# Patient Record
Sex: Female | Born: 1970 | Race: White | Hispanic: No | Marital: Married | Smoking: Never smoker
Health system: Southern US, Community
[De-identification: ages and names within clinical notes are randomized; demographics above are authoritative.]

## PROBLEM LIST (undated history)

## (undated) DIAGNOSIS — G43909 Migraine, unspecified, not intractable, without status migrainosus: Secondary | ICD-10-CM

## (undated) DIAGNOSIS — L309 Dermatitis, unspecified: Secondary | ICD-10-CM

## (undated) DIAGNOSIS — K219 Gastro-esophageal reflux disease without esophagitis: Secondary | ICD-10-CM

## (undated) DIAGNOSIS — T7840XA Allergy, unspecified, initial encounter: Secondary | ICD-10-CM

## (undated) DIAGNOSIS — Z9889 Other specified postprocedural states: Secondary | ICD-10-CM

## (undated) DIAGNOSIS — J309 Allergic rhinitis, unspecified: Secondary | ICD-10-CM

## (undated) DIAGNOSIS — E559 Vitamin D deficiency, unspecified: Secondary | ICD-10-CM

## (undated) DIAGNOSIS — R011 Cardiac murmur, unspecified: Secondary | ICD-10-CM

## (undated) HISTORY — DX: Vitamin D deficiency, unspecified: E55.9

## (undated) HISTORY — PX: WISDOM TOOTH EXTRACTION: SHX21

## (undated) HISTORY — PX: BREAST EXCISIONAL BIOPSY: SUR124

## (undated) HISTORY — DX: Allergy, unspecified, initial encounter: T78.40XA

## (undated) HISTORY — PX: BREAST BIOPSY: SHX20

## (undated) HISTORY — DX: Cardiac murmur, unspecified: R01.1

## (undated) HISTORY — DX: Dermatitis, unspecified: L30.9

## (undated) HISTORY — DX: Allergic rhinitis, unspecified: J30.9

## (undated) HISTORY — DX: Migraine, unspecified, not intractable, without status migrainosus: G43.909

---

## 1992-11-01 HISTORY — PX: BREAST REDUCTION SURGERY: SHX8

## 1992-11-01 HISTORY — PX: REDUCTION MAMMAPLASTY: SUR839

## 1996-11-01 HISTORY — PX: CHOLECYSTECTOMY: SHX55

## 2007-06-30 ENCOUNTER — Ambulatory Visit: Payer: Self-pay | Admitting: Internal Medicine

## 2010-06-11 ENCOUNTER — Ambulatory Visit: Payer: Self-pay

## 2010-10-12 ENCOUNTER — Ambulatory Visit: Payer: Self-pay

## 2010-11-01 HISTORY — PX: BREAST SURGERY: SHX581

## 2010-11-19 ENCOUNTER — Ambulatory Visit: Payer: Self-pay | Admitting: General Surgery

## 2010-11-20 LAB — PATHOLOGY REPORT

## 2011-06-14 ENCOUNTER — Ambulatory Visit: Payer: Self-pay

## 2011-12-13 ENCOUNTER — Ambulatory Visit: Payer: Self-pay

## 2012-11-21 ENCOUNTER — Ambulatory Visit: Payer: Self-pay | Admitting: Family Medicine

## 2013-10-08 ENCOUNTER — Ambulatory Visit: Payer: Self-pay | Admitting: Family Medicine

## 2014-11-25 LAB — HM PAP SMEAR

## 2014-12-10 IMAGING — US TRANSABDOMINAL ULTRASOUND OF PELVIS
1 series · 14 of 25 positions shown · non-contrast
Comparison: none

REASON FOR EXAM: pelvic pain
COMMENTS:

PROCEDURE:     JOSEAN - JOSEAN PELVIS NON-OB W/TRANSVAGINAL  - November 21, 2012  [DATE]
RESULT:     Uterus measures 9.2 x 5.3 x 4.1 cm. IUD noted in place. Ovaries
are unremarkable with normal flow. No hydronephrosis. Trace pelvic fluid.

[Series 1: transabdominal ultrasound of pelvis · 0.28mm/px · 14 of 59 slices shown]
[im 1/59]
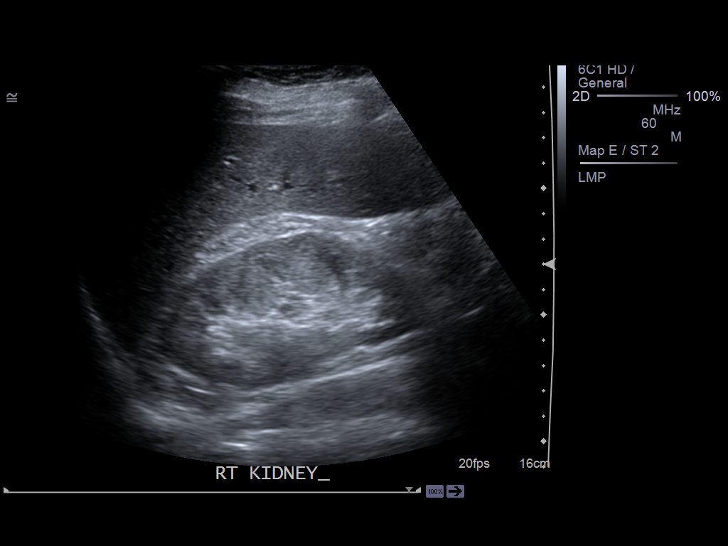
[im 5/59]
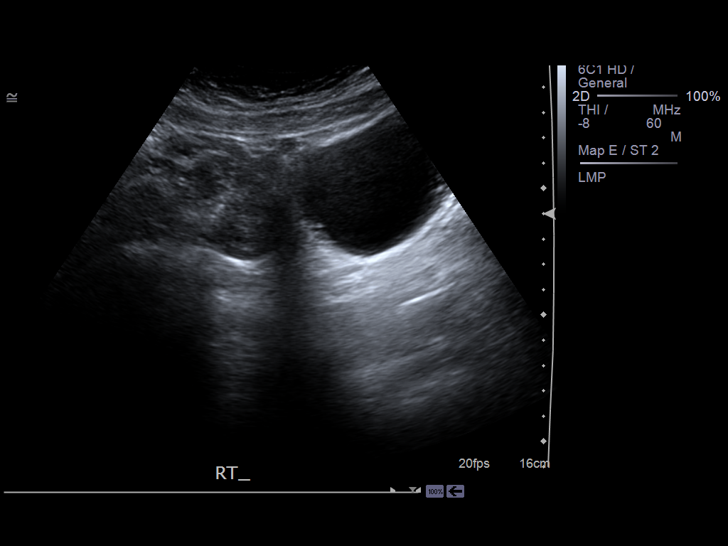
[im 10/59]
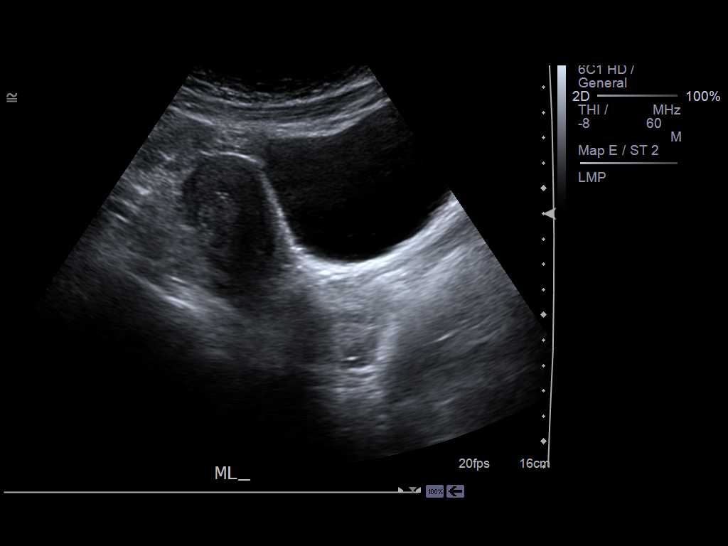
[im 15/59]
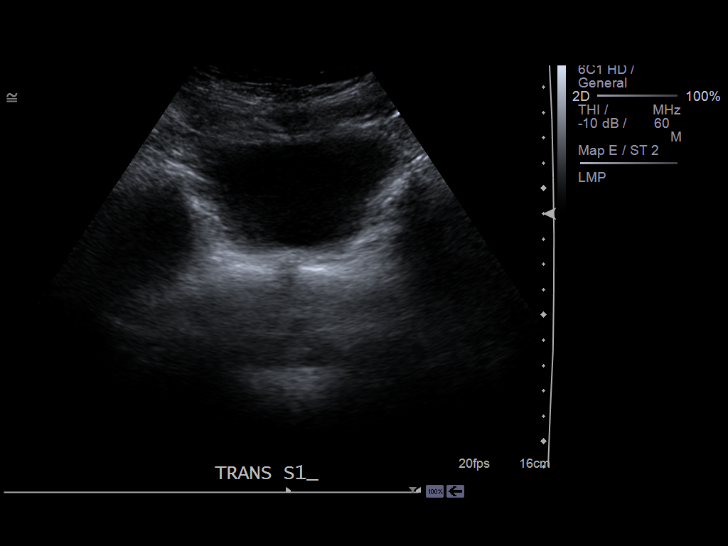
[im 20/59]
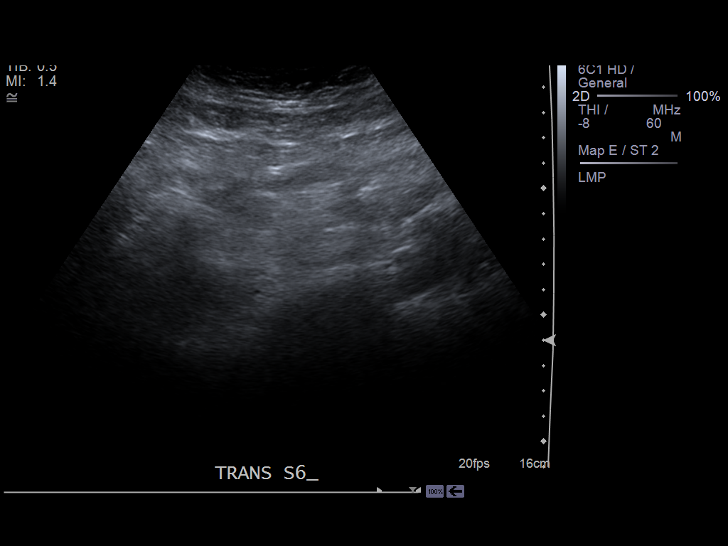
[im 22/59]
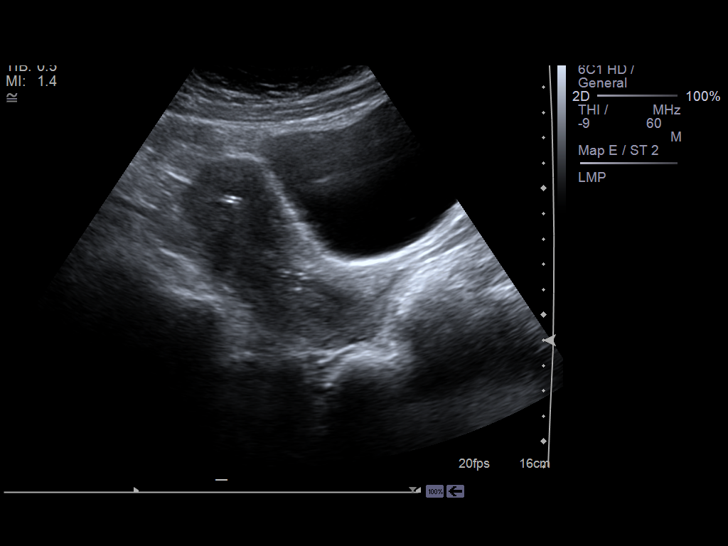
[im 27/59]
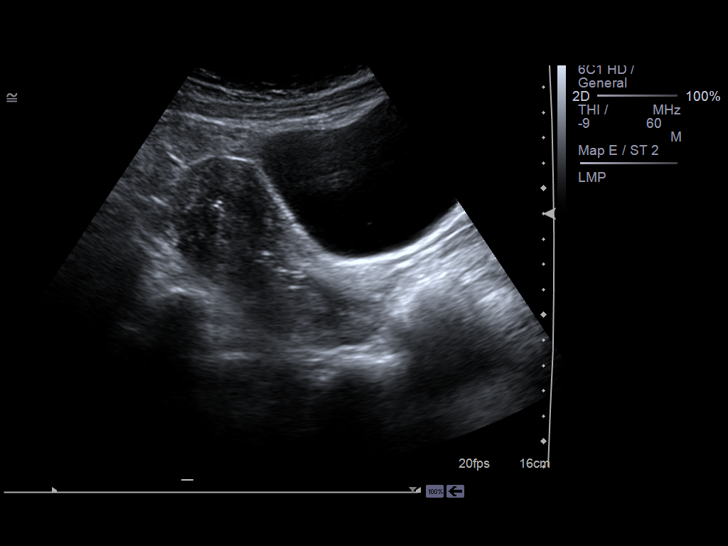
[im 32/59]
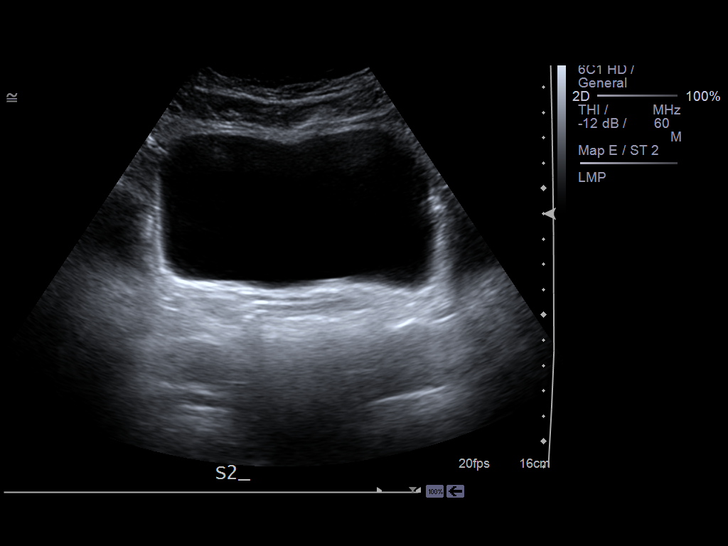
[im 37/59]
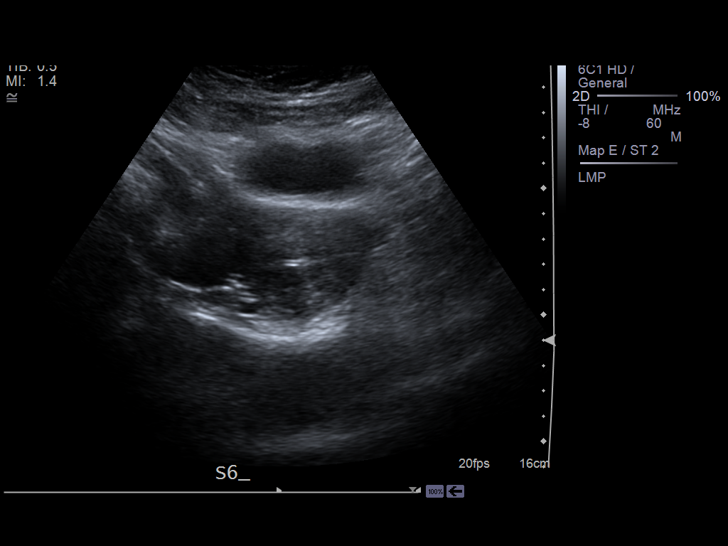
[im 39/59]
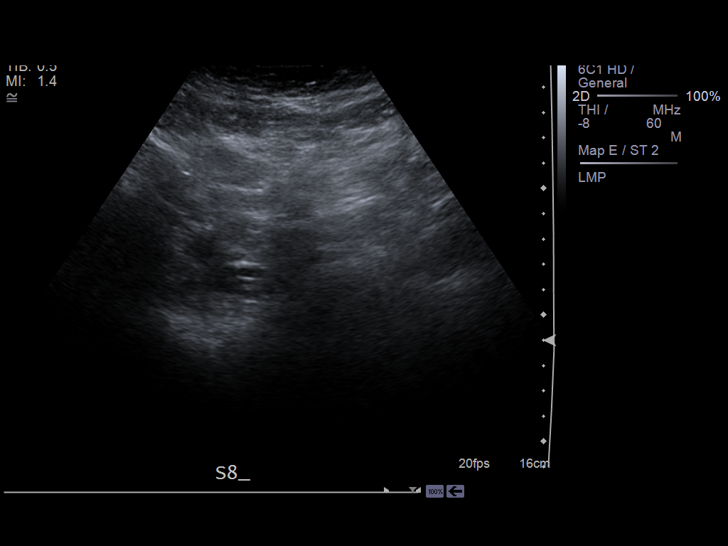
[im 44/59]
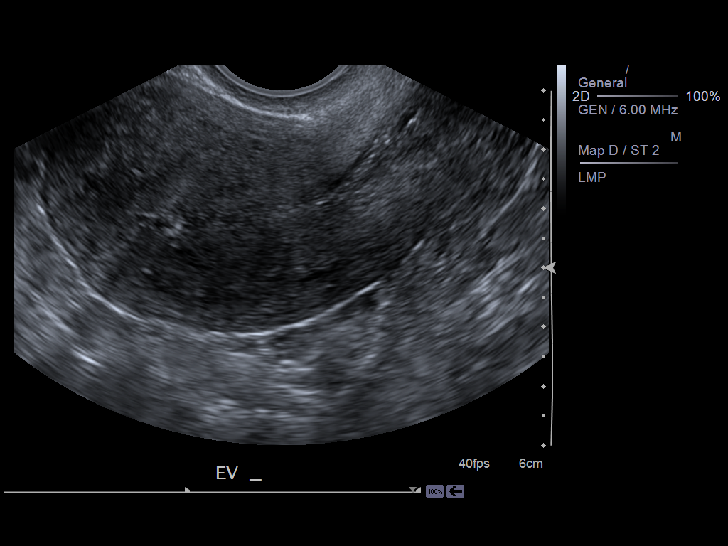
[im 49/59]
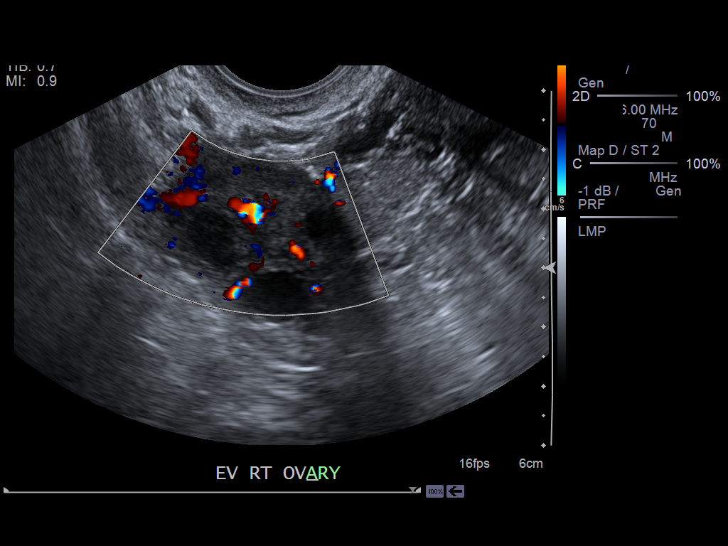
[im 54/59]
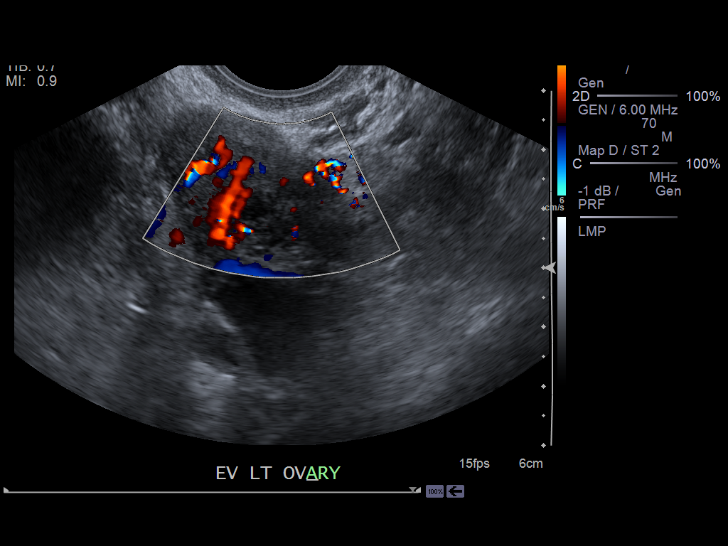
[im 59/59]
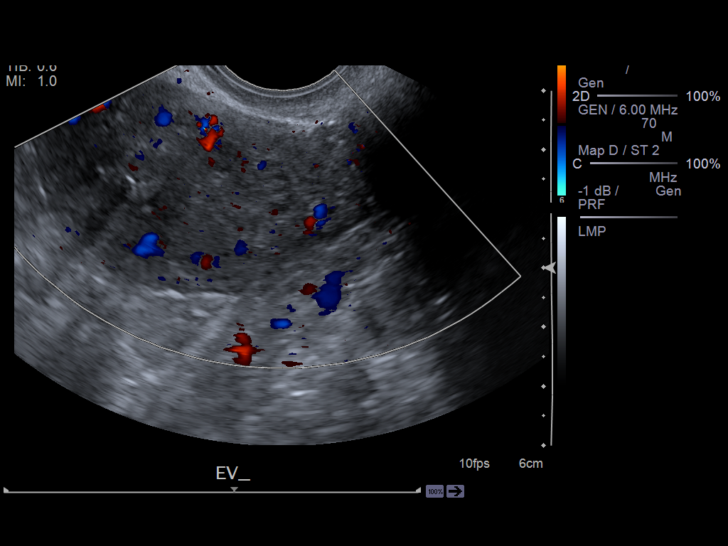

[14 of 25 positions shown; findings below may reference images not displayed]

IMPRESSION: Nonspecific exam. IUD present. Trace pelvic fluid present.

## 2015-02-18 ENCOUNTER — Encounter: Payer: Self-pay | Admitting: *Deleted

## 2015-02-24 ENCOUNTER — Ambulatory Visit (INDEPENDENT_AMBULATORY_CARE_PROVIDER_SITE_OTHER): Payer: BC Managed Care – PPO | Admitting: General Surgery

## 2015-02-24 ENCOUNTER — Encounter: Payer: Self-pay | Admitting: General Surgery

## 2015-02-24 VITALS — BP 130/83 | HR 72 | Resp 14 | Ht 67.0 in | Wt 209.0 lb

## 2015-02-24 DIAGNOSIS — R1013 Epigastric pain: Secondary | ICD-10-CM | POA: Insufficient documentation

## 2015-02-24 NOTE — Patient Instructions (Addendum)
Follow up appointment to be announced.  Patient to have lab work when needed if episode arises.

## 2015-02-24 NOTE — Progress Notes (Signed)
Patient ID: Elizabeth GuarneriKimberly J Consoli, female   DOB: 05/09/1971, 44 y.o.   MRN: [id number]  Chief Complaint  Patient presents with  . Other    Abdomial pain    HPI Elizabeth Case is a 44 y.o. female here today for a evaluation of a abdominal pain. She states her pain is in her upper right quadrant and radiations to her back. The pain lasts for 2-4 hours and is a dull achy  to sharp pain. This has been going on for about about six months and in the last three months more often. No food triggers the pain. No change in bowel habit. She had her gallbladder removed in 1998. The patient had been asymptomatic in regards to post prandial pain until the last 6+ or minus months.  The discomfort is no more than a 5/10, is not prevented her from completing her normal activities and can last anywhere between 1-4 hours. It will frequently radiate into the right upper quadrant into the back one it is most severe. It is associated with nausea, with only one episode of vomiting. She has not appreciated any clear dietary trigger to her pain, nor does she appreciate loose stools after the episodes. No change in the color of her sclera or urine is noted. The patient reports when the episodes are ongoing she experiences tenderness to touch in the epigastrium. Otherwise she is asymptomatic with palpation and is not aware of any thing that improves or exacerbates her pain. HPI  No past medical history on file.  Past Surgical History  Procedure Laterality Date  . Breast reduction surgery  1994  . Breast surgery Right 2012    lumpectomy  . Cholecystectomy  1998    No family history on file.  Social History History  Substance Use Topics  . Smoking status: Never Smoker   . Smokeless tobacco: Not on file  . Alcohol Use: No    No Known Allergies  Current Outpatient Prescriptions  Medication Sig Dispense Refill  . cholecalciferol (VITAMIN D) 1000 UNITS tablet Take 1,000 Units by mouth daily.     No current  facility-administered medications for this visit.    Review of Systems Review of Systems  Constitutional: Negative.   Respiratory: Negative.   Cardiovascular: Negative.   Gastrointestinal: Positive for nausea and abdominal pain. Negative for vomiting, diarrhea, constipation, blood in stool, abdominal distention, anal bleeding and rectal pain.    Blood pressure 130/83, pulse 72, resp. rate 14, height 5\' 7"  (1.702 m), weight 209 lb (94.802 kg).  Physical Exam Physical Exam  Constitutional: She is oriented to person, place, and time. She appears well-developed and well-nourished.  Eyes: Conjunctivae are normal. No scleral icterus.  Neck: Neck supple. No thyromegaly present.  Cardiovascular: Normal rate, regular rhythm and normal heart sounds.   Pulmonary/Chest: Effort normal and breath sounds normal.  Abdominal: Soft. Normal appearance and bowel sounds are normal. There is no hepatomegaly. There is no tenderness. No hernia.  Neurological: She is alert and oriented to person, place, and time.  Skin: Skin is warm and dry.    Data Reviewed Laboratory studies dated 11/25/2014 showed a white blood cell count of 6400, hemoglobin 15.5, MCV 88, platelet count 241,000, normal liver function studies and bilirubin. Creatinine 0.7 with a normal estimated GFR of 108. Normal alkaline phosphatase. Vitamin D level was low at 20.4.  Assessment    Episodic abdominal pain, possible common bile duct stone, less likely peptic ulcer disease.    Plan  The case was informally discussed with GI. MRI of the biliary tree and upper endoscopy recommended. Stability of having a retained common stone with normal liver function studies or possible dysfunction of the sphincter of Oddi were considerations.  Prior to initiated and an expensive workup, the patient would like to have her laboratory studies repeated after one of the severe episodes of pain (most renal still occurring 1 week ago).  She has been  encouraged to present for repeat laboratory studies no lid of the morning following an episode.  A standing order has been placed at [facility] (for Hepatic Function Panel) when patient's next episode arises. Patient has a copy of order and can go to lab to have blood work when needed.       PCP:  Barbaraann Share 02/24/2015, 9:04 PM

## 2015-02-27 ENCOUNTER — Telehealth: Payer: Self-pay | Admitting: General Surgery

## 2015-02-27 NOTE — Telephone Encounter (Signed)
PT CAME IN TODAY &STATES SHE HAD AN EPISODE. SHE WANTED YOU TO KNOW  SHE HAD HER LAB WORK DRAWN THIS AM.

## 2015-02-28 LAB — HEPATIC FUNCTION PANEL
ALT: 11 IU/L (ref 0–32)
AST: 14 IU/L (ref 0–40)
Albumin: 4.4 g/dL (ref 3.5–5.5)
Alkaline Phosphatase: 67 IU/L (ref 39–117)
BILIRUBIN, DIRECT: 0.11 mg/dL (ref 0.00–0.40)
Bilirubin Total: 0.4 mg/dL (ref 0.0–1.2)
TOTAL PROTEIN: 6.6 g/dL (ref 6.0–8.5)

## 2015-02-28 NOTE — Telephone Encounter (Signed)
   Ref Range 1d ago    Total Protein 6.0 - 8.5 g/dL 6.6   Albumin 3.5 - 5.5 g/dL 4.4   Bilirubin Total 0.0 - 1.2 mg/dL 0.4   Bilirubin, Direct 0.00 - 0.40 mg/dL 1.610.11   Alkaline Phosphatase 39 - 117 IU/L 67   AST 0 - 40 IU/L 14   ALT 0 - 32 IU/L 11   Resulting Agency [facility]     Narrative     Performed at: [address] - [facility] [place] [address], [place], [place] [id number] Lab Director: Mila HomerWilliam F Hancock MD, Phone: [phone]       Specimen Collected: 02/27/15 8:05 AM Last Resulted: 02/28/15 5:42 AM        Laboratory results obtained about 8-10 hours after the onset of right upper quadrant pain associated with nausea and diarrhea. No elevation of transaminases noted.  The patient still could be experiencing a common duct stone ball valving and producing symptoms. The next step would be to obtain an MRCP.  Patient was informed of the lab results and the next step to obtain an MRCP. Endoscopy will be reserved pending results of the MRCP study. She is aware that scheduling will likely not take place until May 2.

## 2015-03-03 ENCOUNTER — Other Ambulatory Visit: Payer: Self-pay | Admitting: *Deleted

## 2015-03-03 DIAGNOSIS — R1011 Right upper quadrant pain: Secondary | ICD-10-CM

## 2015-03-03 DIAGNOSIS — R112 Nausea with vomiting, unspecified: Secondary | ICD-10-CM

## 2015-03-03 NOTE — Telephone Encounter (Signed)
Patient has been scheduled for an MRCP at [facility] for 03-06-15 at 3 pm (arrive 2:30 pm). Prep: none.   This patient is aware of date, time, and instructions.

## 2015-03-06 ENCOUNTER — Ambulatory Visit
Admission: RE | Admit: 2015-03-06 | Discharge: 2015-03-06 | Disposition: A | Discharge status: Home / Self Care | Payer: BC Managed Care – PPO | Source: Ambulatory Visit | Attending: General Surgery | Admitting: General Surgery

## 2015-03-06 DIAGNOSIS — R1011 Right upper quadrant pain: Secondary | ICD-10-CM | POA: Diagnosis not present

## 2015-03-06 DIAGNOSIS — R112 Nausea with vomiting, unspecified: Secondary | ICD-10-CM | POA: Diagnosis present

## 2015-03-06 MED ORDER — GADOBENATE DIMEGLUMINE 529 MG/ML IV SOLN
20.0000 mL | Freq: Once | INTRAVENOUS | Status: AC | PRN
  Administered 2015-03-06: 20 mL via INTRAVENOUS

## 2015-03-09 ENCOUNTER — Telehealth: Payer: Self-pay | Admitting: General Surgery

## 2015-03-09 NOTE — Telephone Encounter (Signed)
The patient was notified that the MRI completed 03/06/2015 failed to show any abnormalities that could account for the patient's pain.  She continues to have episodic, low-grade (4-5/10) occasionally associated with nausea, vomiting and/or diarrhea.  Options for management at this time were reviewed: 1) endoscopy, likely upper and lower considering nausea vomiting and diarrhea can be associated with the pain versus 2) University assessment for the unlikely possibility of spasm of the sphincter of Oddi which can only be detected by endoscopic manometry.  The patient will consider her options and notify the office of how she would like to proceed.

## 2015-09-01 ENCOUNTER — Ambulatory Visit (INDEPENDENT_AMBULATORY_CARE_PROVIDER_SITE_OTHER): Payer: BC Managed Care – PPO | Admitting: Family Medicine

## 2015-09-01 ENCOUNTER — Encounter: Payer: Self-pay | Admitting: Family Medicine

## 2015-09-01 VITALS — BP 122/82 | HR 96 | Temp 98.2°F | Wt 213.0 lb

## 2015-09-01 DIAGNOSIS — J320 Chronic maxillary sinusitis: Secondary | ICD-10-CM

## 2015-09-01 DIAGNOSIS — L309 Dermatitis, unspecified: Secondary | ICD-10-CM | POA: Insufficient documentation

## 2015-09-01 DIAGNOSIS — E559 Vitamin D deficiency, unspecified: Secondary | ICD-10-CM | POA: Diagnosis not present

## 2015-09-01 DIAGNOSIS — H1013 Acute atopic conjunctivitis, bilateral: Secondary | ICD-10-CM | POA: Diagnosis not present

## 2015-09-01 DIAGNOSIS — J309 Allergic rhinitis, unspecified: Secondary | ICD-10-CM | POA: Insufficient documentation

## 2015-09-01 DIAGNOSIS — H6121 Impacted cerumen, right ear: Secondary | ICD-10-CM | POA: Diagnosis not present

## 2015-09-01 DIAGNOSIS — G43909 Migraine, unspecified, not intractable, without status migrainosus: Secondary | ICD-10-CM | POA: Insufficient documentation

## 2015-09-01 DIAGNOSIS — H1010 Acute atopic conjunctivitis, unspecified eye: Secondary | ICD-10-CM | POA: Insufficient documentation

## 2015-09-01 MED ORDER — AMOXICILLIN-POT CLAVULANATE 875-125 MG PO TABS: 1.0000 | ORAL_TABLET | Freq: Two times a day (BID) | ORAL | Status: DC

## 2015-09-01 MED ORDER — FLUCONAZOLE 150 MG PO TABS: 150.0000 mg | ORAL_TABLET | Freq: Once | ORAL | Status: DC

## 2015-09-01 NOTE — Assessment & Plan Note (Signed)
On new eye drops per eye doctor

## 2015-09-01 NOTE — Progress Notes (Signed)
BP 122/82 mmHg  Pulse 96  Temp(Src) 98.2 F (36.8 C)  Wt 213 lb (96.616 kg)  SpO2 99%   Subjective:    Patient ID: Elizabeth Case, female    DOB: 29-Feb-1971, 44 y.o.   MRN: [id number]  HPI: Elizabeth Case is a 44 y.o. female  Chief Complaint  Patient presents with  . Sinusitis    off and on x 3 weeks.  . Lab    Would like to have Vit D level checked   She has had sinusitis for 3 weeks; she will use sudafed and using afrin to just breathe; she feels better today than she has in a few days; she is blowing out some yellowish nasty stuff; little bit of fever last night; wonders if she gets them in the middle of the night might be getting hot; no rash; no travel; ears are bothering her some; teeth and eyeballs are obthering her; shooting pains in her eyes and saw eye doctor last week and they started her on Lotemax drops for her eyes, bad allergies  She has history of low vitamin D; she is trying to remember to take 2,000 iu every other day but hard to remember to do that; works indoors; not much time outdoors on the weekends; poor energy; no known thyroid trouble personally or family; weight gain but stress from work; no constipation; she is using an OTC product with greens in it; we reviewed her previous labs; vitamin D in January 2016 was 20.4; recheck in May was 50.9  Relevant past medical, surgical, family and social history reviewed and updated as indicated. Interim medical history since our last visit reviewed. Allergies and medications reviewed and updated.  Review of Systems Per HPI unless specifically indicated above     Objective:    BP 122/82 mmHg  Pulse 96  Temp(Src) 98.2 F (36.8 C)  Wt 213 lb (96.616 kg)  SpO2 99%  Wt Readings from Last 3 Encounters:  09/01/15 213 lb (96.616 kg)  12/18/14 208 lb (94.348 kg)  02/24/15 209 lb (94.802 kg)    Physical Exam  Constitutional: She appears well-developed and well-nourished. No distress.  HENT:  Head:  Normocephalic and atraumatic.  Right Ear: External ear normal.  Left Ear: Hearing, tympanic membrane, external ear and ear canal normal.  No middle ear effusion.  Nose: Rhinorrhea (cloudy rhinorrhea) present. No mucosal edema or nasal deformity. Right sinus exhibits maxillary sinus tenderness. Right sinus exhibits no frontal sinus tenderness. Left sinus exhibits no maxillary sinus tenderness and no frontal sinus tenderness.  Mouth/Throat: Oropharynx is clear and moist and mucous membranes are normal. No oropharyngeal exudate, posterior oropharyngeal edema or posterior oropharyngeal erythema.  Right external ear canal occluded with wax, packed back deep in the canal with fibers and central depression consistent with cotton swab insertion  Eyes: EOM are normal. Right eye exhibits no discharge. Left eye exhibits no discharge. No scleral icterus.  Neck: No thyromegaly present.  Cardiovascular: Normal rate, regular rhythm and normal heart sounds.   No murmur heard. Pulmonary/Chest: Effort normal and breath sounds normal. No respiratory distress. She has no wheezes.  Musculoskeletal: Normal range of motion. She exhibits no edema.  Neurological: She is alert. She exhibits normal muscle tone.  Skin: Skin is warm and dry. No rash noted. She is not diaphoretic. No pallor.  Psychiatric: She has a normal mood and affect. Her behavior is normal. Judgment and thought content normal.   Results for orders placed or performed in visit  on 09/01/15  HM PAP SMEAR  Result Value Ref Range   HM Pap smear per PP       Assessment & Plan:   Problem List Items Addressed This Visit      Other   Vitamin D deficiency disease    Check level today; number had come back up, but patient wanted to have it rechecked again      Relevant Orders   Vit D  25 hydroxy (rtn osteoporosis monitoring)   Allergic conjunctivitis    On new eye drops per eye doctor       Other Visit Diagnoses    Right maxillary sinusitis    -   Primary    start antibiotics; c diff cautions given, contraceptive caution given; rest, hydration, vit C, green tea; do NOT use afrin for more than 3 days    Relevant Medications    amoxicillin-clavulanate (AUGMENTIN) 875-125 MG tablet    fluconazole (DIFLUCAN) 150 MG tablet    Impacted cerumen of right ear        irrigated with warm water; attempted to remove cerumen with currette under direct visualization; unable to remove; use OTC product; to ENT if unresolved       Follow up plan: Return if symptoms worsen or fail to improve.  Meds ordered this encounter  Medications  . LOTEMAX 0.5 % ophthalmic suspension    Sig:   . amoxicillin-clavulanate (AUGMENTIN) 875-125 MG tablet    Sig: Take 1 tablet by mouth 2 (two) times daily.    Dispense:  20 tablet    Refill:  0  . fluconazole (DIFLUCAN) 150 MG tablet    Sig: Take 1 tablet (150 mg total) by mouth once.    Dispense:  1 tablet    Refill:  0   Orders Placed This Encounter  Procedures  . Vit D  25 hydroxy (rtn osteoporosis monitoring)   An after-visit summary was printed and given to the patient at check-out.  Please see the patient instructions which may contain other information and recommendations beyond what is mentioned above in the assessment and plan.

## 2015-09-01 NOTE — Assessment & Plan Note (Addendum)
Check level today; number had come back up, but patient wanted to have it rechecked again

## 2015-09-01 NOTE — Patient Instructions (Addendum)
Try vitamin C (orange juice if not diabetic or vitamin C tablets) and drink green tea to help your immune system during your illness Get plenty of rest and hydration Start the antibiotics Please do eat yogurt daily or take a probiotic daily for the next month or two We want to replace the healthy germs in the gut If you notice foul, watery diarrhea in the next two months, schedule an appointment RIGHT AWAY Try cerumenex or debrox (or generic equivalent) to help soften ear wax in the right ear If you need to see the ear nose throat doctor, just call me Start multiple vitamin dailyCerumen Impaction The structures of the external ear canal secrete a waxy substance known as cerumen. Excess cerumen can build up in the ear canal, causing a condition known as cerumen impaction. Cerumen impaction can cause ear pain and disrupt the function of the ear. The rate of cerumen production differs for each individual. In certain individuals, the configuration of the ear canal may decrease his or her ability to naturally remove cerumen. CAUSES Cerumen impaction is caused by excessive cerumen production or buildup. RISK FACTORS  Frequent use of swabs to clean ears.  Having narrow ear canals.  Having eczema.  Being dehydrated. SIGNS AND SYMPTOMS  Diminished hearing.  Ear drainage.  Ear pain.  Ear itch. TREATMENT Treatment may involve:  Over-the-counter or prescription ear drops to soften the cerumen.  Removal of cerumen by a health care provider. This may be done with:  Irrigation with warm water. This is the most common method of removal.  Ear curettes and other instruments.  Surgery. This may be done in severe cases. HOME CARE INSTRUCTIONS  Take medicines only as directed by your health care provider.  Do not insert objects into the ear with the intent of cleaning the ear. PREVENTION  Do not insert objects into the ear, even with the intent of cleaning the ear. Removing cerumen as a  part of normal hygiene is not necessary, and the use of swabs in the ear canal is not recommended.  Drink enough water to keep your urine clear or pale yellow.  Control your eczema if you have it. SEEK MEDICAL CARE IF:  You develop ear pain.  You develop bleeding from the ear.  The cerumen does not clear after you use ear drops as directed.   This information is not intended to replace advice given to you by your health care provider. Make sure you discuss any questions you have with your health care provider.   Document Released: 11/25/2004 Document Revised: 11/08/2014 Document Reviewed: 06/04/2015 Elsevier Interactive Patient Education [employer]2016 [employer].

## 2015-09-02 ENCOUNTER — Telehealth: Payer: Self-pay | Admitting: Family Medicine

## 2015-09-02 ENCOUNTER — Encounter: Payer: Self-pay | Admitting: Family Medicine

## 2015-09-02 LAB — VITAMIN D 25 HYDROXY (VIT D DEFICIENCY, FRACTURES): Vit D, 25-Hydroxy: 40.3 ng/mL (ref 30.0–100.0)

## 2015-09-02 NOTE — Telephone Encounter (Signed)
I am not sure if urgent care has equipment for ear wax removal but that's one idea; she could call them She can try the drops OTC I'm sorry we weren't able to get her ear cleaned out for her Maybe another ENT office could get her in

## 2015-09-02 NOTE — Telephone Encounter (Signed)
Patient notified

## 2015-09-02 NOTE — Telephone Encounter (Signed)
Pt says her ears are worse and would like to know what to do

## 2015-09-02 NOTE — Telephone Encounter (Signed)
Dr. Sherie DonLada, please advise, she states ENT can't see her until next week.

## 2016-02-02 ENCOUNTER — Ambulatory Visit: Payer: BC Managed Care – PPO | Admitting: Unknown Physician Specialty

## 2016-02-02 ENCOUNTER — Encounter: Payer: Self-pay | Admitting: Family Medicine

## 2016-02-02 ENCOUNTER — Ambulatory Visit (INDEPENDENT_AMBULATORY_CARE_PROVIDER_SITE_OTHER): Payer: BC Managed Care – PPO | Admitting: Family Medicine

## 2016-02-02 VITALS — BP 122/78 | HR 120 | Temp 98.6°F | Resp 16 | Wt 220.0 lb

## 2016-02-02 DIAGNOSIS — R519 Headache, unspecified: Secondary | ICD-10-CM

## 2016-02-02 DIAGNOSIS — J111 Influenza due to unidentified influenza virus with other respiratory manifestations: Secondary | ICD-10-CM | POA: Diagnosis not present

## 2016-02-02 DIAGNOSIS — R51 Headache: Secondary | ICD-10-CM | POA: Diagnosis not present

## 2016-02-02 DIAGNOSIS — R69 Illness, unspecified: Secondary | ICD-10-CM

## 2016-02-02 LAB — POCT INFLUENZA A/B
INFLUENZA B, POC: NEGATIVE
Influenza A, POC: NEGATIVE

## 2016-02-02 MED ORDER — OSELTAMIVIR PHOSPHATE 75 MG PO CAPS: 75.0000 mg | ORAL_CAPSULE | Freq: Two times a day (BID) | ORAL | Status: AC

## 2016-02-02 NOTE — Progress Notes (Signed)
BP 122/78 mmHg  Pulse 120  Temp(Src) 98.6 F (37 C) (Oral)  Resp 16  Wt 220 lb (99.791 kg)  SpO2 97%   Subjective:    Patient ID: Elizabeth Case, female    DOB: June 26, 1971, 45 y.o.   MRN: [id number]  HPI: Elizabeth Case is a 45 y.o. female  Chief Complaint  Patient presents with  . URI    onset 4 day, syptoms include: congestion, cough, sinus pressure, low grade fever.  Taking otc mucinex   Patient has been sick since Friday; didn't have muscle aches; did have a lot of sinus congestion; sinus headache; sore throat; no swollen glands; no rash; ears are bothering her from pressure; had daily fever around lunch time; really fatigued and would take 3 hour naps; coughing, dry and barky; sometimes gets a little phlegm; no shortness of breath or trouble of breathing Tried mucinex and advil Husband sick with influenza A right now Did not get flu shot this year  Relevant past medical, surgical, social history reviewed and updated as indicated Past Medical History  Diagnosis Date  . Allergic rhinitis   . Migraines   . Eczema   . Vitamin D deficiency disease    Past Surgical History  Procedure Laterality Date  . Breast reduction surgery  1994  . Breast surgery Right 2012    lumpectomy  . Cholecystectomy  1998   Social History  Substance Use Topics  . Smoking status: Never Smoker   . Smokeless tobacco: Never Used  . Alcohol Use: No   Allergies and medications reviewed and updated  Review of Systems  Per HPI unless specifically indicated above     Objective:    BP 122/78 mmHg  Pulse 120  Temp(Src) 98.6 F (37 C) (Oral)  Resp 16  Wt 220 lb (99.791 kg)  SpO2 97%  Wt Readings from Last 3 Encounters:  02/02/16 220 lb (99.791 kg)  09/01/15 213 lb (96.616 kg)  12/18/14 208 lb (94.348 kg)    Physical Exam  Constitutional: She appears well-developed and well-nourished. No distress (appears not to feel well but nontoxic).  HENT:  Right Ear: Hearing, tympanic  membrane, external ear and ear canal normal. Tympanic membrane is not erythematous. No middle ear effusion.  Left Ear: Hearing, tympanic membrane, external ear and ear canal normal. Tympanic membrane is not erythematous.  No middle ear effusion.  Nose: No rhinorrhea.  Mouth/Throat: Oropharynx is clear and moist and mucous membranes are normal. No oropharyngeal exudate or posterior oropharyngeal erythema.  Eyes: EOM are normal. No scleral icterus.  Cardiovascular: Regular rhythm.  Tachycardia present.   Heart rate closer to 100 upon auscultation  Pulmonary/Chest: Effort normal and breath sounds normal.  Lymphadenopathy:    She has no cervical adenopathy.  Skin: Skin is warm and dry. No rash noted. No erythema. No pallor.  Psychiatric: She has a normal mood and affect. Her behavior is normal.    Results for orders placed or performed in visit on 02/02/16  POCT Influenza A/B  Result Value Ref Range   Influenza A, POC Negative Negative   Influenza B, POC Negative Negative      Assessment & Plan:   Problem List Items Addressed This Visit    None    Visit Diagnoses    Acute nonintractable headache, unspecified headache type    -  Primary    headaches, fatigue; opt to treat for suspect flu; call if not improving as mono also on ddx; further testing  if needed    Relevant Orders    POCT Influenza A/B (Completed)    Influenza-like illness        explained that I am concerned she most likely has flu-like illness; husband with known flu; will opt to treat with tamiflu; see AVS; ABX not indicated now       Follow up plan: Return if symptoms worsen or fail to improve.  Meds ordered this encounter  Medications  . oseltamivir (TAMIFLU) 75 MG capsule    Sig: Take 1 capsule (75 mg total) by mouth 2 (two) times daily.    Dispense:  10 capsule    Refill:  0   An after-visit summary was printed and given to the patient at check-out.  Please see the patient instructions which may contain other  information and recommendations beyond what is mentioned above in the assessment and plan.

## 2016-02-02 NOTE — Patient Instructions (Addendum)
I suspect that you have flu Start tamiflu Try vitamin C (orange juice if not diabetic or vitamin C tablets) and drink green tea to help your immune system during your illness Get plenty of rest and hydration Call us if needed

## 2016-08-25 ENCOUNTER — Encounter: Payer: Self-pay | Admitting: Family Medicine

## 2016-08-25 ENCOUNTER — Ambulatory Visit (INDEPENDENT_AMBULATORY_CARE_PROVIDER_SITE_OTHER): Payer: BC Managed Care – PPO | Admitting: Family Medicine

## 2016-08-25 VITALS — BP 123/73 | HR 88 | Temp 98.1°F | Resp 16 | Ht 69.0 in | Wt 225.9 lb

## 2016-08-25 DIAGNOSIS — K625 Hemorrhage of anus and rectum: Secondary | ICD-10-CM

## 2016-08-25 DIAGNOSIS — K6289 Other specified diseases of anus and rectum: Secondary | ICD-10-CM

## 2016-08-25 DIAGNOSIS — K629 Disease of anus and rectum, unspecified: Secondary | ICD-10-CM | POA: Diagnosis not present

## 2016-08-25 LAB — CBC WITH DIFFERENTIAL/PLATELET
BASOS ABS: 66 {cells}/uL (ref 0–200)
Basophils Relative: 1 %
EOS PCT: 3 %
Eosinophils Absolute: 198 cells/uL (ref 15–500)
HCT: 44.5 % (ref 35.0–45.0)
HEMOGLOBIN: 15.2 g/dL (ref 11.7–15.5)
LYMPHS ABS: 1980 {cells}/uL (ref 850–3900)
Lymphocytes Relative: 30 %
MCH: 30.2 pg (ref 27.0–33.0)
MCHC: 34.2 g/dL (ref 32.0–36.0)
MCV: 88.3 fL (ref 80.0–100.0)
MONOS PCT: 9 %
MPV: 10.1 fL (ref 7.5–12.5)
Monocytes Absolute: 594 cells/uL (ref 200–950)
NEUTROS ABS: 3762 {cells}/uL (ref 1500–7800)
Neutrophils Relative %: 57 %
PLATELETS: 282 10*3/uL (ref 140–400)
RBC: 5.04 MIL/uL (ref 3.80–5.10)
RDW: 13.2 % (ref 11.0–15.0)
WBC: 6.6 10*3/uL (ref 3.8–10.8)

## 2016-08-25 LAB — POC HEMOCCULT BLD/STL (OFFICE/1-CARD/DIAGNOSTIC): FECAL OCCULT BLD: POSITIVE — AB

## 2016-08-25 MED ORDER — HYDROCORTISONE ACE-PRAMOXINE 1-1 % RE FOAM: 1.0000 | Freq: Three times a day (TID) | RECTAL | 0 refills | Status: DC

## 2016-08-25 NOTE — Progress Notes (Signed)
Name: Elizabeth Case   MRN: [id number]    DOB: 04-16-1971   Date:08/25/2016       Progress Note  Subjective  Chief Complaint  Chief Complaint  Patient presents with  . Acute Visit    Blood in stool (Elizabeth Case pt) x3 days  This patient is followed by Elizabeth Case, new to me  HPI  Blood In Stool: Present for 3 days, bright red blood, present with every bowel movement. this morning she had pain during bowel movement.  She has history of hemorrhoids and anal fissure in the past. She has used Preparation-H which did not provide relief.  No shortness of breath, chest pain, nausea, vomiting, or unusual fatigue.    Past Medical History:  Diagnosis Date  . Allergic rhinitis   . Eczema   . Migraines   . Vitamin D deficiency disease     Past Surgical History:  Procedure Laterality Date  . BREAST REDUCTION SURGERY  1994  . BREAST SURGERY Right 2012   lumpectomy  . CHOLECYSTECTOMY  1998    Family History  Problem Relation Age of Onset  . Hypertension Mother   . Pulmonary fibrosis Mother   . Hypertension Father   . Cancer Paternal Grandmother     breast  . Stroke Paternal Grandfather     cerebral aneurysm caused the stroke  . Diabetes Neg Hx   . Heart disease Neg Hx     Social History   Social History  . Marital status: Married    Spouse name: N/A  . Number of children: N/A  . Years of education: N/A   Occupational History  . Not on file.   Social History Main Topics  . Smoking status: Never Smoker  . Smokeless tobacco: Never Used  . Alcohol use No  . Drug use: No  . Sexual activity: Not on file   Other Topics Concern  . Not on file   Social History Narrative  . No narrative on file     Current Outpatient Prescriptions:  .  cholecalciferol (VITAMIN D) 1000 UNITS tablet, Take 1,000 Units by mouth daily., Disp: , Rfl:  .  desonide (DESOWEN) 0.05 % lotion, Apply 1 application topically 2 (two) times daily as needed., Disp: , Rfl:  .  levonorgestrel (MIRENA)  20 MCG/24HR IUD, by Intrauterine route., Disp: , Rfl:  .  LOTEMAX 0.5 % ophthalmic suspension, , Disp: , Rfl:  .  nortriptyline (PAMELOR) 10 MG capsule, Take 10 mg by mouth at bedtime. For migraine prophylaxis, Disp: , Rfl:  .  SUMAtriptan (IMITREX) 50 MG tablet, Take 50 mg by mouth every 2 (two) hours as needed for migraine. May repeat in 2 hours if headache persists or recurs., Disp: , Rfl:   Allergies  Allergen Reactions  . Latex Dermatitis    Review of Systems  Constitutional: Negative for chills, fever, malaise/fatigue and weight loss.  Cardiovascular: Negative for chest pain.  Gastrointestinal: Positive for blood in stool. Negative for abdominal pain, melena, nausea and vomiting.  Genitourinary: Positive for dysuria. Negative for hematuria.     Objective  Vitals:   08/25/16 0951  BP: 123/73  Pulse: 88  Resp: 16  Temp: 98.1 F (36.7 C)  TempSrc: Oral  SpO2: 96%  Weight: 225 lb 14.4 oz (102.5 kg)  Height: 5\' 9"  (1.753 m)    Physical Exam  Constitutional: She is well-developed, well-nourished, and in no distress.  HENT:  Head: Normocephalic and atraumatic.  Cardiovascular: Normal rate, regular rhythm, S1  normal, S2 normal and normal heart sounds.   No murmur heard. Pulmonary/Chest: Effort normal and breath sounds normal. She has no wheezes.  Abdominal: Soft. Bowel sounds are normal. There is no tenderness.  Genitourinary: Rectal exam shows mass and guaiac positive stool. Rectal exam shows no internal hemorrhoid and no fissure.  Genitourinary Comments: Non bleeding, non tender perirectal mass at 6 o' clock, DRE normal, no tenderness, no palpable internal hemorrhoid, Hemoccult weakly positive.    Nursing note and vitals reviewed.    Assessment & Plan  1. BRBPR (bright red blood per rectum) Unclear etiology, Hemoccult positive. Likely hemorrhoids but will need colonoscopy to assess for source of bleeding. - Ambulatory referral to Gastroenterology - CBC with  Differential - POC Hemoccult Bld/Stl (1-Cd Office Dx)  2. Rectal mass Start on Proctofoam to relieve pain and inflammation. - hydrocortisone-pramoxine (PROCTOFOAM HC) rectal foam; Place 1 applicator rectally 3 (three) times daily.  Dispense: 10 g; Refill: 0   Elizabeth Case [facility] 08/25/2016 9:59 AM

## 2016-09-01 ENCOUNTER — Encounter: Payer: Self-pay | Admitting: *Deleted

## 2016-09-01 ENCOUNTER — Other Ambulatory Visit: Payer: Self-pay

## 2016-09-01 ENCOUNTER — Encounter: Payer: Self-pay | Admitting: Gastroenterology

## 2016-09-01 ENCOUNTER — Ambulatory Visit (INDEPENDENT_AMBULATORY_CARE_PROVIDER_SITE_OTHER): Payer: BC Managed Care – PPO | Admitting: Gastroenterology

## 2016-09-01 ENCOUNTER — Telehealth: Payer: Self-pay | Admitting: Gastroenterology

## 2016-09-01 VITALS — BP 135/55 | HR 91 | Temp 98.7°F | Ht 69.0 in | Wt 227.0 lb

## 2016-09-01 DIAGNOSIS — Z791 Long term (current) use of non-steroidal anti-inflammatories (NSAID): Secondary | ICD-10-CM | POA: Diagnosis not present

## 2016-09-01 DIAGNOSIS — K5900 Constipation, unspecified: Secondary | ICD-10-CM | POA: Insufficient documentation

## 2016-09-01 DIAGNOSIS — K625 Hemorrhage of anus and rectum: Secondary | ICD-10-CM | POA: Diagnosis not present

## 2016-09-01 MED ORDER — NA SULFATE-K SULFATE-MG SULF 17.5-3.13-1.6 GM/177ML PO SOLN: 1.0000 | ORAL | 0 refills | Status: DC

## 2016-09-01 MED ORDER — POLYETHYLENE GLYCOL 3350 17 G PO PACK: 17.0000 g | PACK | Freq: Every day | ORAL | 0 refills | Status: DC

## 2016-09-01 NOTE — Progress Notes (Signed)
Gastroenterology Consultation  Referring Provider:     Ellyn HackShah, Syed Asad A, MD Primary Care Physician:  Baruch GoutyMelinda Lada, MD Primary Gastroenterologist:  Dr. Wyline MoodKiran Malyn Aytes  Reason for Consultation:     Rectal bleeding         HPI:   Elizabeth Case is a 45 y.o. y/o female referred for consultation & management  by Dr. Baruch GoutyMelinda Lada, MD.    Rectal bleeding :  Onset and where was blood seen  :began a few weeks back , after every bowel movement for 3 days in a row , mixed with the stool and in the toilet and covering the stool. Her physician gave her some anusol suppositories which has helped.  Frequency of bowel movements : Once a week , has not taken any laxatives. Consumes fruits and vegetables.  Consistency : consistency of rocks Change in shape of stool:no  Pain associated with bowel movements:yes  Blood thinner usage:no  NSAID's: naprosyn, twice a day , for neck issues and plantar issues .  Prior colonoscopy :no  Family history of colon cancer or polyps:no  Weight loss:no  Stool occult test is positive for blood and Hb 15.2     Past Medical History:  Diagnosis Date  . Allergic rhinitis   . Eczema   . Migraines   . Vitamin D deficiency disease     Past Surgical History:  Procedure Laterality Date  . BREAST REDUCTION SURGERY  1994  . BREAST SURGERY Right 2012   lumpectomy  . CHOLECYSTECTOMY  1998    Prior to Admission medications   Medication Sig Start Date End Date Taking? Authorizing Provider  cholecalciferol (VITAMIN D) 1000 UNITS tablet Take 1,000 Units by mouth daily.   Yes Historical Provider, MD  desonide (DESOWEN) 0.05 % lotion Apply 1 application topically 2 (two) times daily as needed.   Yes Historical Provider, MD  levonorgestrel (MIRENA) 20 MCG/24HR IUD by Intrauterine route.   Yes Historical Provider, MD  naproxen (NAPROSYN) 500 MG tablet Take 500 mg by mouth 2 (two) times daily with a meal.   Yes Historical Provider, MD  hydrocortisone-pramoxine  (PROCTOFOAM HC) rectal foam Place 1 applicator rectally 3 (three) times daily. Patient not taking: Reported on 09/01/2016 08/25/16   Ellyn HackSyed Asad A Shah, MD  LOTEMAX 0.5 % ophthalmic suspension  08/25/15   Historical Provider, MD  nortriptyline (PAMELOR) 10 MG capsule Take 10 mg by mouth at bedtime. For migraine prophylaxis    Historical Provider, MD  SUMAtriptan (IMITREX) 50 MG tablet Take 50 mg by mouth every 2 (two) hours as needed for migraine. May repeat in 2 hours if headache persists or recurs.    Historical Provider, MD    Family History  Problem Relation Age of Onset  . Hypertension Mother   . Pulmonary fibrosis Mother   . Hypertension Father   . Cancer Paternal Grandmother     breast  . Stroke Paternal Grandfather     cerebral aneurysm caused the stroke  . Diabetes Neg Hx   . Heart disease Neg Hx      Social History  Substance Use Topics  . Smoking status: Never Smoker  . Smokeless tobacco: Never Used  . Alcohol use No    Allergies as of 09/01/2016 - Review Complete 09/01/2016  Allergen Reaction Noted  . Latex Dermatitis 09/01/2015    Review of Systems:    All systems reviewed and negative except where noted in HPI.   Physical Exam:  BP Marland Kitchen(!)  135/55   Pulse 91   Temp 98.7 F (37.1 C) (Oral)   Ht 5\' 9"  (1.753 m)   Wt 227 lb (103 kg)   BMI 33.52 kg/m  No LMP recorded. Patient is not currently having periods (Reason: IUD). Psych:  Alert and cooperative. Normal mood and affect. General:   Alert,  Well-developed, well-nourished, pleasant and cooperative in NAD Head:  Normocephalic and atraumatic. Eyes:  Sclera clear, no icterus.   Conjunctiva pink. Ears:  Normal auditory acuity. Nose:  No deformity, discharge, or lesions. Mouth:  No deformity or lesions,oropharynx pink & moist. Neck:  Supple; no masses or thyromegaly. Lungs:  Respirations even and unlabored.  Clear throughout to auscultation.   No wheezes, crackles, or rhonchi. No acute distress. Heart:  Regular  rate and rhythm; no murmurs, clicks, rubs, or gallops. Abdomen:  Normal bowel sounds.  No bruits.  Soft, non-tender and non-distended without masses, hepatosplenomegaly or hernias noted.  No guarding or rebound tenderness.    Msk:  Symmetrical without gross deformities. Good, equal movement & strength bilaterally. Pulses:  Normal pulses noted. Extremities:  No clubbing or edema.  No cyanosis. Neurologic:  Alert and oriented x3;  grossly normal neurologically. Skin:  Intact without significant lesions or rashes. No jaundice. Lymph Nodes:  No significant cervical adenopathy. Psych:  Alert and cooperative. Normal mood and affect.  Imaging Studies: No results found.  Assessment and Plan:   Elizabeth Case is a 45 y.o. y/o female has been referred for rectal bleeding . My impression is that the severe constipation is leading to rectal bleeding.   1. Constipation : High fiber diet , commence on miralax daily , if no better will change to linzess 2. Rectal bleeding : High fiber diet and colonoscopy  3. Long term use of NSAID's: Advised to limit use.   I have discussed alternative options, risks & benefits,  which include, but are not limited to, bleeding, infection, perforation,respiratory complication & drug reaction.  The patient agrees with this plan & written consent will be obtained.    Follow up in 2 months  Dr Wyline MoodKiran Seaborn Nakama MD

## 2016-09-01 NOTE — Patient Instructions (Addendum)
Constipation, Adult °Constipation is when a person has fewer than three bowel movements a week, has difficulty having a bowel movement, or has stools that are dry, hard, or larger than normal. As people grow older, constipation is more common. A low-fiber diet, not taking in enough fluids, and taking certain medicines may make constipation worse.  °CAUSES  °· Certain medicines, such as antidepressants, pain medicine, iron supplements, antacids, and water pills.   °· Certain diseases, such as diabetes, irritable bowel syndrome (IBS), thyroid disease, or depression.   °· Not drinking enough water.   °· Not eating enough fiber-rich foods.   °· Stress or travel.   °· Lack of physical activity or exercise.   °· Ignoring the urge to have a bowel movement.   °· Using laxatives too much.   °SIGNS AND SYMPTOMS  °· Having fewer than three bowel movements a week.   °· Straining to have a bowel movement.   °· Having stools that are hard, dry, or larger than normal.   °· Feeling full or bloated.   °· Pain in the lower abdomen.   °· Not feeling relief after having a bowel movement.   °DIAGNOSIS  °Your health care provider will take a medical history and perform a physical exam. Further testing may be done for severe constipation. Some tests may include: °· A barium enema X-ray to examine your rectum, colon, and, sometimes, your small intestine.   °· A sigmoidoscopy to examine your lower colon.   °· A colonoscopy to examine your entire colon. °TREATMENT  °Treatment will depend on the severity of your constipation and what is causing it. Some dietary treatments include drinking more fluids and eating more fiber-rich foods. Lifestyle treatments may include regular exercise. If these diet and lifestyle recommendations do not help, your health care provider may recommend taking over-the-counter laxative medicines to help you have bowel movements. Prescription medicines may be prescribed if over-the-counter medicines do not work.    °HOME CARE INSTRUCTIONS  °· Eat foods that have a lot of fiber, such as fruits, vegetables, whole grains, and beans. °· Limit foods high in fat and processed sugars, such as french fries, hamburgers, cookies, candies, and soda.   °· A fiber supplement may be added to your diet if you cannot get enough fiber from foods.   °· Drink enough fluids to keep your urine clear or pale yellow.   °· Exercise regularly or as directed by your health care provider.   °· Go to the restroom when you have the urge to go. Do not hold it.   °· Only take over-the-counter or prescription medicines as directed by your health care provider. Do not take other medicines for constipation without talking to your health care provider first.   °SEEK IMMEDIATE MEDICAL CARE IF:  °· You have bright red blood in your stool.   °· Your constipation lasts for more than 4 days or gets worse.   °· You have abdominal or rectal pain.   °· You have thin, pencil-like stools.   °· You have unexplained weight loss. °MAKE SURE YOU:  °· Understand these instructions. °· Will watch your condition. °· Will get help right away if you are not doing well or get worse. °  °This information is not intended to replace advice given to you by your health care provider. Make sure you discuss any questions you have with your health care provider. °  °Document Released: 07/16/2004 Document Revised: 11/08/2014 Document Reviewed: 07/30/2013 °Elsevier Interactive Patient Education ©2016 [employer] ° °High-Fiber Diet °Fiber, also called dietary fiber, is a type of carbohydrate found in fruits, vegetables, whole grains, and   beans. A high-fiber diet can have many health benefits. Your health care provider may recommend a high-fiber diet to help: °· Prevent constipation. Fiber can make your bowel movements more regular. °· Lower your cholesterol. °· Relieve hemorrhoids, uncomplicated diverticulosis, or irritable bowel syndrome. °· Prevent overeating as part of a weight-loss  plan. °· Prevent heart disease, type 2 diabetes, and certain cancers. °WHAT IS MY PLAN? °The recommended daily intake of fiber includes: °· 38 grams for men under age 50. °· 30 grams for men over age 50. °· 25 grams for women under age 50. °· 21 grams for women over age 50. °You can get the recommended daily intake of dietary fiber by eating a variety of fruits, vegetables, grains, and beans. Your health care provider may also recommend a fiber supplement if it is not possible to get enough fiber through your diet. °WHAT DO I NEED TO KNOW ABOUT A HIGH-FIBER DIET? °· Fiber supplements have not been widely studied for their effectiveness, so it is better to get fiber through food sources. °· Always check the fiber content on the nutrition facts label of any prepackaged food. Look for foods that contain at least 5 grams of fiber per serving. °· Ask your dietitian if you have questions about specific foods that are related to your condition, especially if those foods are not listed in the following section. °· Increase your daily fiber consumption gradually. Increasing your intake of dietary fiber too quickly may cause bloating, cramping, or gas. °· Drink plenty of water. Water helps you to digest fiber. °WHAT FOODS CAN I EAT? °Grains °Whole-grain breads. Multigrain cereal. Oats and oatmeal. Brown rice. Barley. Bulgur wheat. Millet. Bran muffins. Popcorn. Rye wafer crackers. °Vegetables °Sweet potatoes. Spinach. Kale. Artichokes. Cabbage. Broccoli. Green peas. Carrots. Squash. °Fruits °Berries. Pears. Apples. Oranges. Avocados. Prunes and raisins. Dried figs. °Meats and Other Protein Sources °Navy, kidney, pinto, and soy beans. Split peas. Lentils. Nuts and seeds. °Dairy °Fiber-fortified yogurt. °Beverages °Fiber-fortified soy milk. Fiber-fortified orange juice. °Other °Fiber bars. °The items listed above may not be a complete list of recommended foods or beverages. Contact your dietitian for more options. °WHAT FOODS  ARE NOT RECOMMENDED? °Grains °White bread. Pasta made with refined flour. White rice. °Vegetables °Fried potatoes. Canned vegetables. Well-cooked vegetables.  °Fruits °Fruit juice. Cooked, strained fruit. °Meats and Other Protein Sources °Fatty cuts of meat. Fried poultry or fried fish. °Dairy °Milk. Yogurt. Cream cheese. Sour cream. °Beverages °Soft drinks. °Other °Cakes and pastries. Butter and oils. °The items listed above may not be a complete list of foods and beverages to avoid. Contact your dietitian for more information. °WHAT ARE SOME TIPS FOR INCLUDING HIGH-FIBER FOODS IN MY DIET? °· Eat a wide variety of high-fiber foods. °· Make sure that half of all grains consumed each day are whole grains. °· Replace breads and cereals made from refined flour or white flour with whole-grain breads and cereals. °· Replace white rice with brown rice, bulgur wheat, or millet. °· Start the day with a breakfast that is high in fiber, such as a cereal that contains at least 5 grams of fiber per serving. °· Use beans in place of meat in soups, salads, or pasta. °· Eat high-fiber snacks, such as berries, raw vegetables, nuts, or popcorn. °  °This information is not intended to replace advice given to you by your health care provider. Make sure you discuss any questions you have with your health care provider. °  °Document Released: 10/18/2005 Document Revised: 11/08/2014 Document   Reviewed: 04/02/2014 °Elsevier Interactive Patient Education ©2016 Elsevier Inc. ° °

## 2016-09-01 NOTE — Telephone Encounter (Signed)
Patient would like to talk to you

## 2016-09-06 ENCOUNTER — Other Ambulatory Visit: Payer: Self-pay

## 2016-09-08 ENCOUNTER — Ambulatory Visit: Payer: BC Managed Care – PPO | Admitting: Anesthesiology

## 2016-09-08 ENCOUNTER — Encounter
Admission: RE | Disposition: A | Discharge status: Home / Self Care | Payer: Self-pay | Source: Ambulatory Visit | Attending: Gastroenterology

## 2016-09-08 ENCOUNTER — Ambulatory Visit
Admission: RE | Admit: 2016-09-08 | Discharge: 2016-09-08 | Disposition: A | Discharge status: Home / Self Care | Payer: BC Managed Care – PPO | Source: Ambulatory Visit | Attending: Gastroenterology | Admitting: Gastroenterology

## 2016-09-08 DIAGNOSIS — G43909 Migraine, unspecified, not intractable, without status migrainosus: Secondary | ICD-10-CM | POA: Diagnosis not present

## 2016-09-08 DIAGNOSIS — Z791 Long term (current) use of non-steroidal anti-inflammatories (NSAID): Secondary | ICD-10-CM | POA: Insufficient documentation

## 2016-09-08 DIAGNOSIS — Z79899 Other long term (current) drug therapy: Secondary | ICD-10-CM | POA: Diagnosis not present

## 2016-09-08 DIAGNOSIS — K640 First degree hemorrhoids: Secondary | ICD-10-CM | POA: Diagnosis not present

## 2016-09-08 DIAGNOSIS — E559 Vitamin D deficiency, unspecified: Secondary | ICD-10-CM | POA: Diagnosis not present

## 2016-09-08 DIAGNOSIS — K625 Hemorrhage of anus and rectum: Secondary | ICD-10-CM | POA: Diagnosis not present

## 2016-09-08 HISTORY — PX: COLONOSCOPY WITH PROPOFOL: SHX5780

## 2016-09-08 HISTORY — DX: Gastro-esophageal reflux disease without esophagitis: K21.9

## 2016-09-08 SURGERY — COLONOSCOPY WITH PROPOFOL
Anesthesia: Monitor Anesthesia Care | Site: Rectum | Wound class: Contaminated

## 2016-09-08 MED ORDER — LIDOCAINE HCL (CARDIAC) 20 MG/ML IV SOLN
INTRAVENOUS | Status: DC | PRN
  Administered 2016-09-08: 40 mg via INTRAVENOUS

## 2016-09-08 MED ORDER — STERILE WATER FOR IRRIGATION IR SOLN
Status: DC | PRN
  Administered 2016-09-08: 08:00:00

## 2016-09-08 MED ORDER — OXYCODONE HCL 5 MG/5ML PO SOLN: 5.0000 mg | Freq: Once | ORAL | Status: DC | PRN

## 2016-09-08 MED ORDER — LACTATED RINGERS IV SOLN
INTRAVENOUS | Status: DC
  Administered 2016-09-08: 08:00:00 via INTRAVENOUS

## 2016-09-08 MED ORDER — PROPOFOL 10 MG/ML IV BOLUS
INTRAVENOUS | Status: DC | PRN
  Administered 2016-09-08 (×2): 20 mg via INTRAVENOUS
  Administered 2016-09-08: 30 mg via INTRAVENOUS
  Administered 2016-09-08: 20 mg via INTRAVENOUS
  Administered 2016-09-08: 30 mg via INTRAVENOUS
  Administered 2016-09-08: 20 mg via INTRAVENOUS
  Administered 2016-09-08: 30 mg via INTRAVENOUS
  Administered 2016-09-08: 20 mg via INTRAVENOUS
  Administered 2016-09-08: 30 mg via INTRAVENOUS
  Administered 2016-09-08: 40 mg via INTRAVENOUS
  Administered 2016-09-08: 70 mg via INTRAVENOUS

## 2016-09-08 MED ORDER — OXYCODONE HCL 5 MG PO TABS: 5.0000 mg | ORAL_TABLET | Freq: Once | ORAL | Status: DC | PRN

## 2016-09-08 SURGICAL SUPPLY — 23 items

## 2016-09-08 NOTE — H&P (Signed)
Jonathon Bellows MD [address] [place], [place] [address] Phone: [phone] Fax : [phone]  Primary Care Physician:  Enid Derry, MD Primary Gastroenterologist:  Dr. Jonathon Bellows   Pre-Procedure History & Physical: HPI:  Elizabeth Case is a 45 y.o. female is here for an colonoscopy.   Past Medical History:  Diagnosis Date  . Allergic rhinitis   . Eczema   . GERD (gastroesophageal reflux disease)   . Migraines    1x/mo  . Vitamin D deficiency disease     Past Surgical History:  Procedure Laterality Date  . BREAST REDUCTION SURGERY  1994  . BREAST SURGERY Right 2012   lumpectomy  . CHOLECYSTECTOMY  1998    Prior to Admission medications   Medication Sig Start Date End Date Taking? Authorizing Provider  cholecalciferol (VITAMIN D) 1000 UNITS tablet Take 1,000 Units by mouth daily.   Yes Historical Provider, MD  desonide (DESOWEN) 0.05 % lotion Apply 1 application topically 2 (two) times daily as needed.   Yes Historical Provider, MD  levonorgestrel (MIRENA) 20 MCG/24HR IUD by Intrauterine route.   Yes Historical Provider, MD  naproxen (NAPROSYN) 500 MG tablet Take 500 mg by mouth 2 (two) times daily with a meal.   Yes Historical Provider, MD  nortriptyline (PAMELOR) 10 MG capsule Take 10 mg by mouth at bedtime. For migraine prophylaxis   Yes Historical Provider, MD  SUMAtriptan (IMITREX) 50 MG tablet Take 50 mg by mouth every 2 (two) hours as needed for migraine. May repeat in 2 hours if headache persists or recurs.   Yes Historical Provider, MD  hydrocortisone-pramoxine (PROCTOFOAM HC) rectal foam Place 1 applicator rectally 3 (three) times daily. Patient not taking: Reported on 09/01/2016 08/25/16   Roselee Nova, MD  LOTEMAX 0.5 % ophthalmic suspension  08/25/15   Historical Provider, MD  Na Sulfate-K Sulfate-Mg Sulf (SUPREP BOWEL PREP KIT) 17.5-3.13-1.6 GM/180ML SOLN Take 1 kit by mouth as directed. 09/01/16   Jonathon Bellows, MD  polyethylene glycol (MIRALAX /  GLYCOLAX) packet Take 17 g by mouth daily. 09/01/16   Jonathon Bellows, MD    Allergies as of 09/01/2016 - Review Complete 09/01/2016  Allergen Reaction Noted  . Kiwi extract Shortness Of Breath 09/01/2016  . Latex Dermatitis 09/01/2015  . Other Swelling 09/01/2016    Family History  Problem Relation Age of Onset  . Hypertension Mother   . Pulmonary fibrosis Mother   . Hypertension Father   . Cancer Paternal Grandmother     breast  . Stroke Paternal Grandfather     cerebral aneurysm caused the stroke  . Diabetes Neg Hx   . Heart disease Neg Hx     Social History   Social History  . Marital status: Married    Spouse name: N/A  . Number of children: N/A  . Years of education: N/A   Occupational History  . Not on file.   Social History Main Topics  . Smoking status: Never Smoker  . Smokeless tobacco: Never Used  . Alcohol use 0.0 oz/week     Comment: holidays  . Drug use: No  . Sexual activity: Not on file   Other Topics Concern  . Not on file   Social History Narrative  . No narrative on file    Review of Systems: See HPI, otherwise negative ROS  Physical Exam: BP 129/79   Pulse 93   Temp 97.5 F (36.4 C) (Temporal)   Ht '5\' 9"'  (1.753 m)   Wt  219 lb (99.3 kg)   SpO2 96%   BMI 32.34 kg/m  General:   Alert,  pleasant and cooperative in NAD Head:  Normocephalic and atraumatic. Neck:  Supple; no masses or thyromegaly. Lungs:  Clear throughout to auscultation.    Heart:  Regular rate and rhythm. Abdomen:  Soft, nontender and nondistended. Normal bowel sounds, without guarding, and without rebound.   Neurologic:  Alert and  oriented x4;  grossly normal neurologically.  Impression/Plan: Elizabeth Case is here for an colonoscopy to be performed for rectal bleeding   Risks, benefits, limitations, and alternatives regarding  colonoscopy have been reviewed with the patient.  Questions have been answered.  All parties agreeable.   Jonathon Bellows, MD  09/08/2016,  7:19 AM

## 2016-09-08 NOTE — Transfer of Care (Signed)
Immediate Anesthesia Transfer of Care Note  Patient: Elizabeth Case  Procedure(s) Performed: Procedure(s) with comments: COLONOSCOPY WITH PROPOFOL (N/A) - Latex sensitivity  Patient Location: PACU  Anesthesia Type: MAC  Level of Consciousness: awake, alert  and patient cooperative  Airway and Oxygen Therapy: Patient Spontanous Breathing and Patient connected to supplemental oxygen  Post-op Assessment: Post-op Vital signs reviewed, Patient's Cardiovascular Status Stable, Respiratory Function Stable, Patent Airway and No signs of Nausea or vomiting  Post-op Vital Signs: Reviewed and stable  Complications: No apparent anesthesia complications

## 2016-09-08 NOTE — Anesthesia Preprocedure Evaluation (Signed)
Anesthesia Evaluation  Patient identified by MRN, date of birth, ID band Patient awake    Reviewed: Allergy & Precautions, H&P , NPO status   History of Anesthesia Complications Negative for: history of anesthetic complications  Airway Mallampati: II  TM Distance: >3 FB Neck ROM: full    Dental no notable dental hx.    Pulmonary neg pulmonary ROS,    Pulmonary exam normal        Cardiovascular negative cardio ROS Normal cardiovascular exam     Neuro/Psych    GI/Hepatic Neg liver ROS, Medicated,  Endo/Other  negative endocrine ROS  Renal/GU negative Renal ROS     Musculoskeletal   Abdominal   Peds  Hematology negative hematology ROS (+)   Anesthesia Other Findings   Reproductive/Obstetrics                             Anesthesia Physical Anesthesia Plan  ASA: II  Anesthesia Plan: MAC   Post-op Pain Management:    Induction:   Airway Management Planned:   Additional Equipment:   Intra-op Plan:   Post-operative Plan:   Informed Consent:   Plan Discussed with:   Anesthesia Plan Comments:         Anesthesia Quick Evaluation

## 2016-09-08 NOTE — Anesthesia Postprocedure Evaluation (Signed)
Anesthesia Post Note  Patient: Elizabeth Case  Procedure(s) Performed: Procedure(s) (LRB): COLONOSCOPY WITH PROPOFOL (N/A)  Patient location during evaluation: PACU Anesthesia Type: MAC Level of consciousness: awake and alert Pain management: pain level controlled Vital Signs Assessment: post-procedure vital signs reviewed and stable Respiratory status: spontaneous breathing Cardiovascular status: blood pressure returned to baseline Postop Assessment: no headache Anesthetic complications: no    Verner Cholunkle, III,  Oviya Ammar D

## 2016-09-08 NOTE — Op Note (Signed)
[facility] Gastroenterology Patient Name: Elizabeth AltesKimberly Case Procedure Date: 09/08/2016 8:07 AM MRN: [id number] Account #: [id number] Date of Birth: May 18, 1971 Admit Type: Outpatient Age: 45 [facility] Gender: Female Note Status: Finalized Procedure:            Colonoscopy Indications:          Rectal bleeding Providers:            Wyline MoodKiran Ciarah Peace MD, MD Medicines:            Monitored Anesthesia Care Complications:        No immediate complications. Procedure:            Pre-Anesthesia Assessment:                       - Prior to the procedure, a History and Physical was                        performed, and patient medications, allergies and                        sensitivities were reviewed. The patient's tolerance of                        previous anesthesia was reviewed.                       - ASA Grade Assessment: II - A patient with mild                        systemic disease.                       After obtaining informed consent, the colonoscope was                        passed under direct vision. Throughout the procedure,                        the patient's blood pressure, pulse, and oxygen                        saturations were monitored continuously. The Olympus CF                        H180AL colonoscope (S#: [id number]) was introduced through                        the anus and advanced to the the cecum, identified by                        the appendiceal orifice, IC valve and                        transillumination. The colonoscopy was performed with                        ease. The patient tolerated the procedure well. The                        quality of the bowel preparation was excellent. Findings:  The perianal and digital rectal examinations were normal.      Non-bleeding internal hemorrhoids were found during retroflexion. The       hemorrhoids were medium-sized and Grade I (internal hemorrhoids that do       not  prolapse).      The entire examined colon appeared normal. Impression:           - Non-bleeding internal hemorrhoids.                       - The entire examined colon is normal.                       - No specimens collected. Recommendation:       - Repeat colonoscopy in 10 years for screening purposes. Procedure Code(s):    --- Professional ---                       [id number], Colonoscopy, flexible; diagnostic, including                        collection of specimen(s) by brushing or washing, when                        performed (separate procedure) Diagnosis Code(s):    --- Professional ---                       K62.5, Hemorrhage of anus and rectum                       K64.0, First degree hemorrhoids CPT copyright 2016 American Medical Association. All rights reserved. The codes documented in this report are preliminary and upon coder review may  be revised to meet current compliance requirements. Wyline MoodKiran Sharlet Notaro, MD Wyline MoodKiran Wayden Schwertner MD, MD 09/08/2016 8:28:37 AM This report has been signed electronically. Number of Addenda: 0 Note Initiated On: 09/08/2016 8:07 AM Scope Withdrawal Time: 0 hours 8 minutes 52 seconds  Total Procedure Duration: 0 hours 11 minutes 44 seconds       [facility]

## 2016-09-08 NOTE — Anesthesia Procedure Notes (Signed)
Procedure Name: MAC Performed by: Kaiea Esselman Pre-anesthesia Checklist: Patient identified, Emergency Drugs available, Suction available, Timeout performed and Patient being monitored Patient Re-evaluated:Patient Re-evaluated prior to inductionOxygen Delivery Method: Nasal cannula Placement Confirmation: positive ETCO2       

## 2016-10-21 ENCOUNTER — Other Ambulatory Visit: Payer: Self-pay

## 2016-10-21 DIAGNOSIS — K5900 Constipation, unspecified: Secondary | ICD-10-CM

## 2016-10-21 DIAGNOSIS — K625 Hemorrhage of anus and rectum: Secondary | ICD-10-CM

## 2016-10-21 MED ORDER — POLYETHYLENE GLYCOL 3350 17 G PO PACK: 17.0000 g | PACK | Freq: Every day | ORAL | 3 refills | Status: DC

## 2017-02-08 ENCOUNTER — Telehealth: Payer: Self-pay

## 2017-02-08 ENCOUNTER — Ambulatory Visit (INDEPENDENT_AMBULATORY_CARE_PROVIDER_SITE_OTHER): Payer: BC Managed Care – PPO | Admitting: Family Medicine

## 2017-02-08 ENCOUNTER — Encounter: Payer: Self-pay | Admitting: Family Medicine

## 2017-02-08 VITALS — BP 118/74 | HR 78 | Temp 98.4°F | Resp 16 | Ht 67.0 in | Wt 219.0 lb

## 2017-02-08 DIAGNOSIS — N898 Other specified noninflammatory disorders of vagina: Secondary | ICD-10-CM | POA: Diagnosis not present

## 2017-02-08 DIAGNOSIS — Z1231 Encounter for screening mammogram for malignant neoplasm of breast: Secondary | ICD-10-CM

## 2017-02-08 DIAGNOSIS — Z0000 Encounter for general adult medical examination without abnormal findings: Secondary | ICD-10-CM

## 2017-02-08 DIAGNOSIS — T8332XA Displacement of intrauterine contraceptive device, initial encounter: Secondary | ICD-10-CM

## 2017-02-08 DIAGNOSIS — Z124 Encounter for screening for malignant neoplasm of cervix: Secondary | ICD-10-CM | POA: Diagnosis not present

## 2017-02-08 DIAGNOSIS — Z1239 Encounter for other screening for malignant neoplasm of breast: Secondary | ICD-10-CM

## 2017-02-08 LAB — CBC WITH DIFFERENTIAL/PLATELET
Basophils Absolute: 69 cells/uL (ref 0–200)
Basophils Relative: 1 %
EOS ABS: 138 {cells}/uL (ref 15–500)
Eosinophils Relative: 2 %
HCT: 45.1 % — ABNORMAL HIGH (ref 35.0–45.0)
HEMOGLOBIN: 15 g/dL (ref 11.7–15.5)
LYMPHS PCT: 29 %
Lymphs Abs: 2001 cells/uL (ref 850–3900)
MCH: 29.3 pg (ref 27.0–33.0)
MCHC: 33.3 g/dL (ref 32.0–36.0)
MCV: 88.1 fL (ref 80.0–100.0)
MONO ABS: 621 {cells}/uL (ref 200–950)
MPV: 10.6 fL (ref 7.5–12.5)
Monocytes Relative: 9 %
NEUTROS PCT: 59 %
Neutro Abs: 4071 cells/uL (ref 1500–7800)
Platelets: 270 10*3/uL (ref 140–400)
RBC: 5.12 MIL/uL — ABNORMAL HIGH (ref 3.80–5.10)
RDW: 13.5 % (ref 11.0–15.0)
WBC: 6.9 10*3/uL (ref 3.8–10.8)

## 2017-02-08 LAB — COMPLETE METABOLIC PANEL WITH GFR
ALT: 21 U/L (ref 6–29)
AST: 17 U/L (ref 10–35)
Albumin: 4.5 g/dL (ref 3.6–5.1)
Alkaline Phosphatase: 71 U/L (ref 33–115)
BUN: 10 mg/dL (ref 7–25)
CALCIUM: 8.9 mg/dL (ref 8.6–10.2)
CO2: 26 mmol/L (ref 20–31)
Chloride: 103 mmol/L (ref 98–110)
Creat: 0.77 mg/dL (ref 0.50–1.10)
GFR, Est Non African American: >89 mL/min (ref >=60)
Glucose, Bld: 79 mg/dL (ref 65–99)
POTASSIUM: 4 mmol/L (ref 3.5–5.3)
Sodium: 137 mmol/L (ref 135–146)
Total Bilirubin: 0.4 mg/dL (ref 0.2–1.2)
Total Protein: 6.9 g/dL (ref 6.1–8.1)

## 2017-02-08 LAB — LIPID PANEL
Cholesterol: 158 mg/dL (ref <200)
HDL: 42 mg/dL — AB (ref >50)
LDL Cholesterol: 96 mg/dL (ref <100)
Total CHOL/HDL Ratio: 3.8 Ratio (ref <5.0)
Triglycerides: 102 mg/dL (ref <150)
VLDL: 20 mg/dL (ref <30)

## 2017-02-08 LAB — TSH: TSH: 1.7 m[IU]/L

## 2017-02-08 NOTE — Telephone Encounter (Signed)
Pt called and she states she would rather get her [facility] done at [facility] since its cheaper. Debbe Odea already schedule pt appointment for [facility] therefore latins stated to me she would have to call over to [facility] to see if they are wiling to do the [place] at there office since they are a specialist. If they said its ok, I mention to the pt to give our office called so we can cancel the [place] at [facility]. Pt agreed.

## 2017-02-08 NOTE — Progress Notes (Signed)
Patient ID: Elizabeth Case, female   DOB: 04/19/1971, 46 y.o.   MRN: [id number]   Subjective:   Elizabeth Case is a 46 y.o. female here for a complete physical exam  Interim issues since last visit: none; still with vaginal odor  USPSTF grade A and B recommendations Depression:  Depression screen [facility] 2/9 02/08/2017 08/25/2016 02/02/2016  Decreased Interest 0 0 0  Down, Depressed, Hopeless 0 0 0  PHQ - 2 Score 0 0 0  Hypertension: controlled Obesity: she is working on weight loss, going to walk more, eating beter Alcohol: rare Tobacco use: never smoker HIV, hep B, hep C: had a baby, had it done for life insurance STD testing and prevention (chl/gon/syphilis): recheck odor Intimate partner violence: no abuse Breast cancer:  BRCA gene screening: no ovarian cancer. PGM breast cancer Cervical cancer screening: today Osteoporosis: n/a Fall prevention/vitamin D: taking 1000 or 1200 iu daily; avoid high amounts unless low Lipids: 2016 excellent Glucose: recheck today Colorectal cancer: no fam hx; had colonoscopy just recently before Christmas for bad bleeding; hemorrhoid internal; calmed down; doctor started miralax every few days Lung cancer: n/a  AAA: n/a Aspirin: no Diet: eating better Exercise: walking more Skin cancer: no worrisome moles  Past Medical History:  Diagnosis Date  . Allergic rhinitis   . Eczema   . GERD (gastroesophageal reflux disease)   . Migraines    1x/mo  . Vitamin D deficiency disease    Past Surgical History:  Procedure Laterality Date  . BREAST REDUCTION SURGERY  1994  . BREAST SURGERY Right 2012   lumpectomy  . CHOLECYSTECTOMY  1998  . COLONOSCOPY WITH PROPOFOL N/A 09/08/2016   Procedure: COLONOSCOPY WITH PROPOFOL;  Surgeon: Jonathon Bellows, MD;  Location: [facility];  Service: Endoscopy;  Laterality: N/A;  Latex sensitivity  IUD placed, card said 2019; had spotting on Sunday  Family History  Problem Relation Age of Onset  .  Hypertension Mother   . Pulmonary fibrosis Mother   . Hypertension Father   . Cancer Paternal Grandmother     breast  . Stroke Paternal Grandfather     cerebral aneurysm caused the stroke  . Aneurysm Paternal Grandfather   . Stroke Maternal Grandmother   . Diabetes Neg Hx   . Heart disease Neg Hx    Social History  Substance Use Topics  . Smoking status: Never Smoker  . Smokeless tobacco: Never Used  . Alcohol use 0.0 oz/week     Comment: holidays   Review of Systems  Objective:   Vitals:   02/08/17 1331  BP: 118/74  Pulse: 78  Resp: 16  Temp: 98.4 F (36.9 C)  TempSrc: Oral  SpO2: 96%  Weight: 219 lb (99.3 kg)  Height: '5\' 7"'  (1.702 m)   Body mass index is 34.3 kg/m. Wt Readings from Last 3 Encounters:  02/09/17 221 lb (100.2 kg)  02/08/17 219 lb (99.3 kg)  09/08/16 219 lb (99.3 kg)   Physical Exam  Constitutional: She appears well-developed and well-nourished.  HENT:  Head: Normocephalic and atraumatic.  Eyes: Conjunctivae and EOM are normal. Right eye exhibits no hordeolum. Left eye exhibits no hordeolum. No scleral icterus.  Neck: Carotid bruit is not present. No thyromegaly present.  Cardiovascular: Normal rate, regular rhythm, S1 normal, S2 normal and normal heart sounds.   No extrasystoles are present.  Pulmonary/Chest: Effort normal and breath sounds normal. No respiratory distress. Right breast exhibits no inverted nipple, no mass, no nipple discharge, no skin change  and no tenderness. Left breast exhibits no inverted nipple, no mass, no nipple discharge, no skin change and no tenderness. Breasts are symmetrical.  Abdominal: Soft. Normal appearance and bowel sounds are normal. She exhibits no distension, no abdominal bruit, no pulsatile midline mass and no mass. There is no hepatosplenomegaly. There is no tenderness. No hernia.  Genitourinary: Uterus normal. Pelvic exam was performed with patient prone. There is no rash or lesion on the right labia. There is  no rash or lesion on the left labia. Right adnexum displays no mass, no tenderness and no fullness. Left adnexum displays no mass, no tenderness and no fullness. Vaginal discharge (sanguinous, brownish discharge, thin and watery) found.  Genitourinary Comments: Could not reach the cervix with speculum or fingers on bimanual exam; did not feel IUD string  Musculoskeletal: Normal range of motion. She exhibits no edema.  Lymphadenopathy:       Head (right side): No submandibular adenopathy present.       Head (left side): No submandibular adenopathy present.    She has no cervical adenopathy.    She has no axillary adenopathy.  Neurological: She is alert. She displays no tremor. No cranial nerve deficit. She exhibits normal muscle tone. Gait normal.  Skin: Skin is warm and dry. No bruising and no ecchymosis noted. No cyanosis. No pallor.  Psychiatric: Her speech is normal and behavior is normal. Thought content normal. Her mood appears not anxious. She does not exhibit a depressed mood.    Assessment/Plan:   Problem List Items Addressed This Visit      Other   Preventative health care - Primary    USPSTF grade A and B recommendations reviewed with patient; age-appropriate recommendations, preventive care, screening tests, etc discussed and encouraged; healthy living encouraged; see AVS for patient education given to patient      Relevant Orders   CBC with Differential/Platelet (Completed)   COMPLETE METABOLIC PANEL WITH GFR (Completed)   TSH (Completed)   Lipid panel (Completed)   Cervical cancer screening    Pap smear today planned, but too much discharge, could not see cervix; sending to GYN      Relevant Orders   Ambulatory referral to Gynecology    Other Visit Diagnoses    Vaginal odor       wet prep collected today   Relevant Orders   WET PREP BY MOLECULAR PROBE (Completed)   Breast cancer screening       order mammo   Relevant Orders   MM Digital Screening   Intrauterine  contraceptive device threads lost, initial encounter       refer to GYN, order [place]   Relevant Orders   US Transvaginal Non-OB   US Pelvis Complete   Ambulatory referral to Gynecology       No orders of the defined types were placed in this encounter.  Orders Placed This Encounter  Procedures  . WET PREP BY MOLECULAR PROBE  . MM Digital Screening    Standing Status:   Future    Standing Expiration Date:   04/10/2018    Order Specific Question:   Reason for Exam (SYMPTOM  OR DIAGNOSIS REQUIRED)    Answer:   screen for breast cancer    Order Specific Question:   Is the patient pregnant?    Answer:   No    Order Specific Question:   Preferred imaging location?    Answer:   [facility]  . US Transvaginal Non-OB    Order  Specific Question:   Reason for Exam (SYMPTOM  OR DIAGNOSIS REQUIRED)    Answer:   lost IUD string    Order Specific Question:   Preferred imaging location?    Answer:   [facility]  . US Pelvis Complete    Order Specific Question:   Reason for Exam (SYMPTOM  OR DIAGNOSIS REQUIRED)    Answer:   IUD string lost    Order Specific Question:   Preferred imaging location?    Answer:   [facility]  . CBC with Differential/Platelet  . COMPLETE METABOLIC PANEL WITH GFR  . TSH  . Lipid panel  . Ambulatory referral to Gynecology    Referral Priority:   Routine    Referral Type:   Consultation    Referral Reason:   Specialty Services Required    Requested Specialty:   Gynecology    Number of Visits Requested:   1    Follow up plan: Return in about 1 year (around 02/08/2018) for complete physical.  An After Visit Summary was printed and given to the patient.

## 2017-02-08 NOTE — Patient Instructions (Addendum)
RepHresh is a product that may help Health Maintenance, Female Adopting a healthy lifestyle and getting preventive care can go a long way to promote health and wellness. Talk with your health care provider about what schedule of regular examinations is right for you. This is a good chance for you to check in with your provider about disease prevention and staying healthy. In between checkups, there are plenty of things you can do on your own. Experts have done a lot of research about which lifestyle changes and preventive measures are most likely to keep you healthy. Ask your health care provider for more information. Weight and diet Eat a healthy diet  Be sure to include plenty of vegetables, fruits, low-fat dairy products, and lean protein.  Do not eat a lot of foods high in solid fats, added sugars, or salt.  Get regular exercise. This is one of the most important things you can do for your health.  Most adults should exercise for at least 150 minutes each week. The exercise should increase your heart rate and make you sweat (moderate-intensity exercise).  Most adults should also do strengthening exercises at least twice a week. This is in addition to the moderate-intensity exercise. Maintain a healthy weight  Body mass index (BMI) is a measurement that can be used to identify possible weight problems. It estimates body fat based on height and weight. Your health care provider can help determine your BMI and help you achieve or maintain a healthy weight.  For females 58 years of age and older:  A BMI below 18.5 is considered underweight.  A BMI of 18.5 to 24.9 is normal.  A BMI of 25 to 29.9 is considered overweight.  A BMI of 30 and above is considered obese. Watch levels of cholesterol and blood lipids  You should start having your blood tested for lipids and cholesterol at 45 years of age, then have this test every 5 years.  You may need to have your cholesterol levels checked  more often if:  Your lipid or cholesterol levels are high.  You are older than 45 years of age.  You are at high risk for heart disease. Cancer screening Lung Cancer  Lung cancer screening is recommended for adults 91-16 years old who are at high risk for lung cancer because of a history of smoking.  A yearly low-dose CT scan of the lungs is recommended for people who:  Currently smoke.  Have quit within the past 15 years.  Have at least a 30-pack-year history of smoking. A pack year is smoking an average of one pack of cigarettes a day for 1 year.  Yearly screening should continue until it has been 15 years since you quit.  Yearly screening should stop if you develop a health problem that would prevent you from having lung cancer treatment. Breast Cancer  Practice breast self-awareness. This means understanding how your breasts normally appear and feel.  It also means doing regular breast self-exams. Let your health care provider know about any changes, no matter how small.  If you are in your 20s or 30s, you should have a clinical breast exam (CBE) by a health care provider every 1-3 years as part of a regular health exam.  If you are 22 or older, have a CBE every year. Also consider having a breast X-ray (mammogram) every year.  If you have a family history of breast cancer, talk to your health care provider about genetic screening.  If you are  at high risk for breast cancer, talk to your health care provider about having an MRI and a mammogram every year.  Breast cancer gene (BRCA) assessment is recommended for women who have family members with BRCA-related cancers. BRCA-related cancers include:  Breast.  Ovarian.  Tubal.  Peritoneal cancers.  Results of the assessment will determine the need for genetic counseling and BRCA1 and BRCA2 testing. Cervical Cancer  Your health care provider may recommend that you be screened regularly for cancer of the pelvic organs  (ovaries, uterus, and vagina). This screening involves a pelvic examination, including checking for microscopic changes to the surface of your cervix (Pap test). You may be encouraged to have this screening done every 3 years, beginning at age 49.  For women ages 29-65, health care providers may recommend pelvic exams and Pap testing every 3 years, or they may recommend the Pap and pelvic exam, combined with testing for human papilloma virus (HPV), every 5 years. Some types of HPV increase your risk of cervical cancer. Testing for HPV may also be done on women of any age with unclear Pap test results.  Other health care providers may not recommend any screening for nonpregnant women who are considered low risk for pelvic cancer and who do not have symptoms. Ask your health care provider if a screening pelvic exam is right for you.  If you have had past treatment for cervical cancer or a condition that could lead to cancer, you need Pap tests and screening for cancer for at least 20 years after your treatment. If Pap tests have been discontinued, your risk factors (such as having a new sexual partner) need to be reassessed to determine if screening should resume. Some women have medical problems that increase the chance of getting cervical cancer. In these cases, your health care provider may recommend more frequent screening and Pap tests. Colorectal Cancer  This type of cancer can be detected and often prevented.  Routine colorectal cancer screening usually begins at 45 years of age and continues through 45 years of age.  Your health care provider may recommend screening at an earlier age if you have risk factors for colon cancer.  Your health care provider may also recommend using home test kits to check for hidden blood in the stool.  A small camera at the end of a tube can be used to examine your colon directly (sigmoidoscopy or colonoscopy). This is done to check for the earliest forms of  colorectal cancer.  Routine screening usually begins at age 91.  Direct examination of the colon should be repeated every 5-10 years through 45 years of age. However, you may need to be screened more often if early forms of precancerous polyps or small growths are found. Skin Cancer  Check your skin from head to toe regularly.  Tell your health care provider about any new moles or changes in moles, especially if there is a change in a mole's shape or color.  Also tell your health care provider if you have a mole that is larger than the size of a pencil eraser.  Always use sunscreen. Apply sunscreen liberally and repeatedly throughout the day.  Protect yourself by wearing long sleeves, pants, a wide-brimmed hat, and sunglasses whenever you are outside. Heart disease, diabetes, and high blood pressure  High blood pressure causes heart disease and increases the risk of stroke. High blood pressure is more likely to develop in:  People who have blood pressure in the high end of  the normal range (130-139/85-89 mm Hg).  People who are overweight or obese.  People who are African American.  If you are 58-46 years of age, have your blood pressure checked every 3-5 years. If you are 35 years of age or older, have your blood pressure checked every year. You should have your blood pressure measured twice-once when you are at a hospital or clinic, and once when you are not at a hospital or clinic. Record the average of the two measurements. To check your blood pressure when you are not at a hospital or clinic, you can use:  An automated blood pressure machine at a pharmacy.  A home blood pressure monitor.  If you are between 35 years and 66 years old, ask your health care provider if you should take aspirin to prevent strokes.  Have regular diabetes screenings. This involves taking a blood sample to check your fasting blood sugar level.  If you are at a normal weight and have a low risk for  diabetes, have this test once every three years after 45 years of age.  If you are overweight and have a high risk for diabetes, consider being tested at a younger age or more often. Preventing infection Hepatitis B  If you have a higher risk for hepatitis B, you should be screened for this virus. You are considered at high risk for hepatitis B if:  You were born in a country where hepatitis B is common. Ask your health care provider which countries are considered high risk.  Your parents were born in a high-risk country, and you have not been immunized against hepatitis B (hepatitis B vaccine).  You have HIV or AIDS.  You use needles to inject street drugs.  You live with someone who has hepatitis B.  You have had sex with someone who has hepatitis B.  You get hemodialysis treatment.  You take certain medicines for conditions, including cancer, organ transplantation, and autoimmune conditions. Hepatitis C  Blood testing is recommended for:  Everyone born from [birth year] through 1964.  Anyone with known risk factors for hepatitis C. Sexually transmitted infections (STIs)  You should be screened for sexually transmitted infections (STIs) including gonorrhea and chlamydia if:  You are sexually active and are younger than 45 years of age.  You are older than 46 years of age and your health care provider tells you that you are at risk for this type of infection.  Your sexual activity has changed since you were last screened and you are at an increased risk for chlamydia or gonorrhea. Ask your health care provider if you are at risk.  If you do not have HIV, but are at risk, it may be recommended that you take a prescription medicine daily to prevent HIV infection. This is called pre-exposure prophylaxis (PrEP). You are considered at risk if:  You are sexually active and do not regularly use condoms or know the HIV status of your partner(s).  You take drugs by injection.  You are  sexually active with a partner who has HIV. Talk with your health care provider about whether you are at high risk of being infected with HIV. If you choose to begin PrEP, you should first be tested for HIV. You should then be tested every 3 months for as long as you are taking PrEP. Pregnancy  If you are premenopausal and you may become pregnant, ask your health care provider about preconception counseling.  If you may become pregnant, take 400  to 800 micrograms (mcg) of folic acid every day.  If you want to prevent pregnancy, talk to your health care provider about birth control (contraception). Osteoporosis and menopause  Osteoporosis is a disease in which the bones lose minerals and strength with aging. This can result in serious bone fractures. Your risk for osteoporosis can be identified using a bone density scan.  If you are 97 years of age or older, or if you are at risk for osteoporosis and fractures, ask your health care provider if you should be screened.  Ask your health care provider whether you should take a calcium or vitamin D supplement to lower your risk for osteoporosis.  Menopause may have certain physical symptoms and risks.  Hormone replacement therapy may reduce some of these symptoms and risks. Talk to your health care provider about whether hormone replacement therapy is right for you. Follow these instructions at home:  Schedule regular health, dental, and eye exams.  Stay current with your immunizations.  Do not use any tobacco products including cigarettes, chewing tobacco, or electronic cigarettes.  If you are pregnant, do not drink alcohol.  If you are breastfeeding, limit how much and how often you drink alcohol.  Limit alcohol intake to no more than 1 drink per day for nonpregnant women. One drink equals 12 ounces of beer, 5 ounces of wine, or 1 ounces of hard liquor.  Do not use street drugs.  Do not share needles.  Ask your health care  provider for help if you need support or information about quitting drugs.  Tell your health care provider if you often feel depressed.  Tell your health care provider if you have ever been abused or do not feel safe at home. This information is not intended to replace advice given to you by your health care provider. Make sure you discuss any questions you have with your health care provider. Document Released: 05/03/2011 Document Revised: 03/25/2016 Document Reviewed: 07/22/2015 Elsevier Interactive Patient Education  2017 [employer].

## 2017-02-08 NOTE — Assessment & Plan Note (Addendum)
Pap smear today planned, but too much discharge, could not see cervix; sending to GYN

## 2017-02-08 NOTE — Telephone Encounter (Signed)
A message was left for this patient stating that she has been scheduled to have her ultrasound on 02/15/17 @ 3pm at [facility] ([facility] mall). She is to arrive at 2:45pm. She was instructed to start drinking water at 2:00pm but be finished by 2:30pm without using the restroom.

## 2017-02-09 ENCOUNTER — Other Ambulatory Visit: Payer: Self-pay | Admitting: Family Medicine

## 2017-02-09 ENCOUNTER — Encounter: Payer: Self-pay | Admitting: Family Medicine

## 2017-02-09 ENCOUNTER — Ambulatory Visit (INDEPENDENT_AMBULATORY_CARE_PROVIDER_SITE_OTHER): Payer: BC Managed Care – PPO

## 2017-02-09 ENCOUNTER — Ambulatory Visit (INDEPENDENT_AMBULATORY_CARE_PROVIDER_SITE_OTHER): Payer: BC Managed Care – PPO | Admitting: Obstetrics and Gynecology

## 2017-02-09 ENCOUNTER — Encounter: Payer: Self-pay | Admitting: Obstetrics and Gynecology

## 2017-02-09 ENCOUNTER — Ambulatory Visit: Payer: BC Managed Care – PPO | Admitting: Obstetrics and Gynecology

## 2017-02-09 VITALS — BP 110/80 | HR 66 | Ht 68.0 in | Wt 221.0 lb

## 2017-02-09 DIAGNOSIS — R718 Other abnormality of red blood cells: Secondary | ICD-10-CM | POA: Insufficient documentation

## 2017-02-09 DIAGNOSIS — R1032 Left lower quadrant pain: Secondary | ICD-10-CM

## 2017-02-09 DIAGNOSIS — Z30431 Encounter for routine checking of intrauterine contraceptive device: Secondary | ICD-10-CM | POA: Diagnosis not present

## 2017-02-09 DIAGNOSIS — Z0000 Encounter for general adult medical examination without abnormal findings: Secondary | ICD-10-CM | POA: Insufficient documentation

## 2017-02-09 LAB — WET PREP BY MOLECULAR PROBE
Candida species: NOT DETECTED
Gardnerella vaginalis: NOT DETECTED
TRICHOMONAS VAG: NOT DETECTED

## 2017-02-09 MED ORDER — FLUTICASONE PROPIONATE 50 MCG/ACT NA SUSP: 2.0000 | Freq: Every day | NASAL | 6 refills | Status: DC

## 2017-02-09 NOTE — Assessment & Plan Note (Signed)
USPSTF grade A and B recommendations reviewed with patient; age-appropriate recommendations, preventive care, screening tests, etc discussed and encouraged; healthy living encouraged; see AVS for patient education given to patient  

## 2017-02-09 NOTE — Assessment & Plan Note (Signed)
May be congestion, allergies, etc, but consider OSA; will start nasal spray, recheck

## 2017-02-09 NOTE — Progress Notes (Signed)
   Chief Complaint  Patient presents with  . Contraception  IUD check--no strings visible with PCP   History of Present Illness:  Elizabeth Case is a 46 y.o. that had a Mirena IUD placed approximately 4 years ago. She has been amenorrheic with the IUD until 02/06/17. She saw her PCP for IUD check and the strings were not visible/palpable. Pt states strings are very short and she can't feel them.  Pt is here for IUD check on u/s and f/u. She has also noticed intermittent LLQ twinges, likes someone is pinching her. Sx are frequent when it happens, then resolve spontaneously. No aggrav factors. She has a hx of constipation, taking miralax with sx improvement.   Review of Systems  Constitutional: Negative for fever, malaise/fatigue and weight loss.  Gastrointestinal: Negative for blood in stool, constipation, diarrhea, nausea and vomiting.  Genitourinary: Negative for dysuria, flank pain, frequency, hematuria and urgency.  Musculoskeletal: Negative for back pain.  Skin: Negative for itching and rash.    Physical Exam:  BP 110/80 (Patient Position: Sitting)   Pulse 66   Ht  (1.727 m)   Wt 221 lb (100.2 kg)   LMP 02/03/2017   BMI 33.60 kg/m  Body mass index is 33.6 kg/m.  NO GYN EXAM PERFORMED SINCE DONE YESTERDAY  RESULTS: GYN u/s-->EM=5.5 mm; NO FF IN CDS, BILAT OVAR WITH FOLLICLES; IUD IN PROPER POSITION  Assessment:  Encounter for routine checking of intrauterine contraceptive device (IUD) - IUD in place per u/s. Pt states due for removal next yr but may have 8 yr Mirena indication by then. If not, pt to RTO with MD for IUD removal. NSAIDs before.  LLQ pain - Neg GYN u/s. Question GI due to constipation vs musculoskeletal. Follow expectantly.   F/u prn   Alicia B. Copland, PA-C 02/09/2017 11:47 AM

## 2017-02-15 ENCOUNTER — Ambulatory Visit: Payer: BC Managed Care – PPO

## 2017-10-03 ENCOUNTER — Ambulatory Visit: Payer: BC Managed Care – PPO | Admitting: Family Medicine

## 2017-10-03 ENCOUNTER — Encounter: Payer: Self-pay | Admitting: Family Medicine

## 2017-10-03 VITALS — BP 118/80 | HR 100 | Temp 98.4°F | Ht 67.0 in | Wt 217.5 lb

## 2017-10-03 DIAGNOSIS — J069 Acute upper respiratory infection, unspecified: Secondary | ICD-10-CM

## 2017-10-03 DIAGNOSIS — L308 Other specified dermatitis: Secondary | ICD-10-CM

## 2017-10-03 DIAGNOSIS — E559 Vitamin D deficiency, unspecified: Secondary | ICD-10-CM

## 2017-10-03 DIAGNOSIS — Z23 Encounter for immunization: Secondary | ICD-10-CM | POA: Diagnosis not present

## 2017-10-03 MED ORDER — TRIAMCINOLONE ACETONIDE 0.5 % EX CREA: 1.0000 "application " | TOPICAL_CREAM | Freq: Three times a day (TID) | CUTANEOUS | 6 refills | Status: DC

## 2017-10-03 NOTE — Patient Instructions (Addendum)
Try vitamin C (orange juice if not diabetic or vitamin C tablets) and drink green tea to help your immune system during your illness Get plenty of rest and hydration  Use the new cream for eczema break-out Avoid hot water Consider taking 5,000 iu of vitamin D3 once a WEEK  Hand Dermatitis Hand dermatitis is a skin condition. It causes small, itchy, raised dots or fluid-filled blisters to form on the palms of the hands. This condition may also be called hand eczema. Follow these instructions at home:  Take or apply over-the-counter and prescription medicines only as told by your doctor.  If you were prescribed an antibiotic medicine, use it as told by your doctor. Do not stop using the antibiotic even if you start to feel better.  Avoid washing your hands more often than you need to.  Avoid using harsh chemicals on your hands.  Wear gloves that protect your hands when you handle products that can bother (irritate) your skin.  Keep all follow-up visits as told by your doctor. This is important. Contact a doctor if:  Your rash is not better after one week of treatment.  Your rash is red.  Your rash is tender.  Your rash has pus coming from it.  Your rash spreads. This information is not intended to replace advice given to you by your health care provider. Make sure you discuss any questions you have with your health care provider. Document Released: 01/12/2010 Document Revised: 03/25/2016 Document Reviewed: 05/02/2015 Elsevier Interactive Patient Education  [employer]2018 Elsevier Inc.

## 2017-10-03 NOTE — Assessment & Plan Note (Signed)
Start 5,000 iu vitamin D3 once a week (NOT daily); patient does not think she can remember to take daily [phone], so once a week dosing option should work for her

## 2017-10-03 NOTE — Progress Notes (Signed)
BP 118/80 (BP Location: Right Arm, Patient Position: Sitting, Cuff Size: Large)   Pulse 100   Temp 98.4 F (36.9 C) (Oral)   Ht 5\' 7"  (1.702 m)   Wt 217 lb 8 oz (98.7 kg)   SpO2 95%   BMI 34.07 kg/m    Subjective:    Patient ID: Elizabeth Case, female    DOB: [birth year], 46 y.o.   MRN: [id number]  HPI: Elizabeth Case is a 46 y.o. female  Chief Complaint  Patient presents with  . Eczema    Pt states that she has had a flare  . URI    Pt states that she would like her ears and throat checked     HPI Patient is here for a flare of her eczema On fire on Friday Had the little blisters She has used Aveeno Has nothing prescription Usually uses coconut and lavender and melaluca Not in the family Patient has allergies, seasonal; allergic to kiwi and walnuts and latex  She also has an upper respiratory infection; would like to have her ear and throat checked Just started over the weekend; coughing, not bringing anything up Ears started hurting today, left and right; no drainage or decreased hearing Throat just scratchy, no tender nodes; no fevers 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 Allergic rhinitis   . Eczema   . GERD (gastroesophageal reflux disease)   . Migraines    1x/mo  . Vitamin D deficiency disease    Past Surgical History:  Procedure Laterality Date  . BREAST REDUCTION SURGERY  1994  . BREAST SURGERY Right 2012   lumpectomy  . CHOLECYSTECTOMY  1998  . COLONOSCOPY WITH PROPOFOL N/A 09/08/2016   Procedure: COLONOSCOPY WITH PROPOFOL;  Surgeon: Wyline MoodKiran Anna, MD;  [facility];  Service: Endoscopy;  Laterality: N/A;  Latex sensitivity   Family History  Problem Relation Age of Onset  .  Hypertension Mother   . Pulmonary fibrosis Mother   . Hypertension Father   . Cancer Paternal Grandmother        breast  . Stroke Paternal Grandfather        cerebral aneurysm caused the stroke  . Aneurysm Paternal Grandfather   . Stroke Maternal Grandmother   . Diabetes Neg Hx   . Heart disease Neg Hx    Social History   Tobacco Use  . Smoking status: Never Smoker  . Smokeless tobacco: Never Used  Substance Use Topics  . Alcohol use: Yes    Alcohol/week: 0.0 oz    Comment: holidays  . Drug use: No   Interim medical history since last visit reviewed. Allergies and medications reviewed  Review of Systems Per HPI unless specifically indicated above     Objective:    BP 118/80 (BP Location: Right Arm, Patient Position: Sitting, Cuff Size: Large)   Pulse 100   Temp 98.4 F (36.9 C) (Oral)   Ht 5\' 7"  (1.702 m)   Wt 217 lb 8 oz (98.7 kg)   SpO2 95%   BMI 34.07 kg/m   Wt Readings from Last 3 Encounters:  10/03/17 217 lb 8 oz (98.7 kg)  02/09/17 221 lb (100.2 kg)  02/08/17 219 lb (99.3 kg)  Physical Exam  Constitutional: She appears well-developed and well-nourished.  HENT:  Right Ear: External ear normal.  Left Ear: External ear normal.  Nose: Nose normal.  Mouth/Throat: Oropharynx is clear and moist. No oropharyngeal exudate.  Eyes: Right eye exhibits no discharge. Left eye exhibits no discharge.  Neck: No thyromegaly present.  Cardiovascular: Normal rate and regular rhythm.  No extrasystoles are present.  Pulse was under 100 during auscultation by examiner  Lymphadenopathy:    She has no cervical adenopathy.  Neurological: She is alert.  Skin:  On the dorsum of both hands, there is mild to moderate erythema in patches, with some cracks; no large fissures; along the sides of a few fingers, there are tiny vesicular lesions; the AC fossa are normal  Psychiatric: Her mood appears not anxious.       Assessment & Plan:   Problem List Items Addressed This  Visit      Musculoskeletal and Integument   Eczema - Primary    With recent flare; will use Rx TAC cream topically; too strong for face, axilla, groin, children; avoid hot water exposure; see AVS        Other   Vitamin D deficiency disease    Start 5,000 iu vitamin D3 once a week (NOT daily); patient does not think she can remember to take daily ([phone], so once a week dosing option should work for her       Other Visit Diagnoses    Flu vaccine need       no fever, no major infectious process other that sounds like mild viral URI; should not interfere with antibody production much   Relevant Orders   Flu Vaccine QUAD 36+ mos IM (Completed)   Upper respiratory tract infection, unspecified type       very mild; no medicines recommended, just rest, symptomatic care       Follow up plan: No Follow-up on file.  An after-visit summary was printed and given to the patient at check-out.  Please see the patient instructions which may contain other information and recommendations beyond what is mentioned above in the assessment and plan.  Meds ordered this encounter  Medications  . triamcinolone cream (KENALOG) 0.5 %    Sig: Apply 1 application topically 3 (three) times daily.    Dispense:  30 g    Refill:  6    Orders Placed This Encounter  Procedures  . Flu Vaccine QUAD 36+ mos IM

## 2017-10-03 NOTE — Assessment & Plan Note (Signed)
With recent flare; will use Rx TAC cream topically; too strong for face, axilla, groin, children; avoid hot water exposure; see AVS

## 2017-12-26 ENCOUNTER — Telehealth: Payer: Self-pay | Admitting: Obstetrics and Gynecology

## 2017-12-26 NOTE — Telephone Encounter (Signed)
Pt is schedule 12/28/17 with [facility] for mirena removal and reinsertion.

## 2017-12-27 NOTE — Telephone Encounter (Signed)
Mirena stock available for this pateint.

## 2017-12-28 ENCOUNTER — Ambulatory Visit: Payer: BC Managed Care – PPO | Admitting: Obstetrics and Gynecology

## 2017-12-28 ENCOUNTER — Encounter: Payer: Self-pay | Admitting: Obstetrics and Gynecology

## 2017-12-28 VITALS — HR 108 | Ht 69.0 in | Wt 224.0 lb

## 2017-12-28 DIAGNOSIS — Z30433 Encounter for removal and reinsertion of intrauterine contraceptive device: Secondary | ICD-10-CM | POA: Diagnosis not present

## 2017-12-28 MED ORDER — LEVONORGESTREL 20 MCG/24HR IU IUD: INTRAUTERINE_SYSTEM | Freq: Once | INTRAUTERINE | 0 refills | Status: DC

## 2017-12-28 NOTE — Patient Instructions (Signed)
I value your feedback and entrusting us with your care. If you get a [facility] patient survey, I would appreciate you taking the time to let us know about your experience today. Thank you! 

## 2017-12-28 NOTE — Progress Notes (Signed)
   Chief Complaint  Patient presents with  . Contraception    Mirena Removal and reinsert      History of Present Illness:  Elizabeth Case is a 47 y.o. that had a Mirena IUD placed approximately 5 years ago. Since that time, she denies dyspareunia, pelvic pain, vaginal d/c, heavy bleeding. Has occas light spotting. She would like a replacement.    Pulse (!) 108   Ht 5\' 9"  (1.753 m)   Wt 224 lb (101.6 kg)   LMP 12/13/2017 (Exact Date)   BMI 33.08 kg/m   Pelvic exam:  Two IUD strings present seen coming from the cervical os. EGBUS, vaginal vault and cervix: within normal limits  IUD Removal Strings of IUD identified and grasped.  IUD removed without problem with ring forceps.  Pt tolerated this well.  IUD noted to be intact.  IUD Insertion Procedure Note Patient identified, informed consent performed, consent signed.   Discussed risks of irregular bleeding, cramping, infection, malpositioning or misplacement of the IUD outside the uterus which may require further procedure such as laparoscopy, risk of failure <1%. Time out was performed.    Speculum placed in the vagina.  Cervix visualized.  Cleaned with Betadine x 2.  Grasped anteriorly with a single tooth tenaculum.  Uterus sounded to 8.0cm.   IUD placed per manufacturer's recommendations.  Strings trimmed to 3 cm. Tenaculum was removed, good hemostasis noted.  Patient tolerated procedure well.   ASSESSMENT:  Encounter for removal and reinsertion of intrauterine contraceptive device (IUD) - Plan: levonorgestrel (MIRENA) 20 MCG/24HR IUD   Meds ordered this encounter  Medications  . levonorgestrel (MIRENA) 20 MCG/24HR IUD    Sig: by Intrauterine route once for 1 dose.    Dispense:  1 each    Refill:  0    Order Specific Question:   Supervising Provider    Answer:   Nadara MustardHARRIS, ROBERT P [[id number]]     Plan:  Patient was given post-procedure instructions.  She was advised to have backup contraception for one week.   Call if  you are having increasing pain, cramps or bleeding or if you have a fever greater than 100.4 degrees F., shaking chills, nausea or vomiting. Patient was also asked to check IUD strings periodically and follow up in 4 weeks for IUD check.  Return in about 4 weeks (around 01/25/2018) for IUD f/u.  Michie Molnar B. Chimere Klingensmith, PA-C 12/28/2017 3:10 PM

## 2018-01-20 NOTE — Telephone Encounter (Signed)
Elizabeth Case received 12/28/17

## 2018-01-25 ENCOUNTER — Ambulatory Visit: Payer: BC Managed Care – PPO | Admitting: Obstetrics and Gynecology

## 2018-01-31 ENCOUNTER — Encounter: Payer: Self-pay | Admitting: Obstetrics and Gynecology

## 2018-01-31 ENCOUNTER — Ambulatory Visit (INDEPENDENT_AMBULATORY_CARE_PROVIDER_SITE_OTHER): Payer: BC Managed Care – PPO | Admitting: Obstetrics and Gynecology

## 2018-01-31 VITALS — BP 120/80 | HR 65 | Ht 69.0 in | Wt 226.0 lb

## 2018-01-31 DIAGNOSIS — Z30431 Encounter for routine checking of intrauterine contraceptive device: Secondary | ICD-10-CM

## 2018-01-31 DIAGNOSIS — Z1151 Encounter for screening for human papillomavirus (HPV): Secondary | ICD-10-CM | POA: Diagnosis not present

## 2018-01-31 DIAGNOSIS — Z124 Encounter for screening for malignant neoplasm of cervix: Secondary | ICD-10-CM

## 2018-01-31 NOTE — Progress Notes (Signed)
   Chief Complaint  Patient presents with  . Contraception     History of Present Illness:  Elizabeth Case is a 47 y.o. that had a Mirena IUD placed approximately 5 weeks ago. Since that time, she denies dyspareunia, pelvic pain, non-menstrual bleeding, vaginal d/c, heavy bleeding.  Annual 4/19 with Dr. Sherie DonLada. Due for pap smear. Dr. Sherie DonLada suggested GYN do pap last yr due to cervical d/c.   Review of Systems  Constitutional: Negative for fever.  Gastrointestinal: Negative for blood in stool, constipation, diarrhea, nausea and vomiting.  Genitourinary: Negative for dyspareunia, dysuria, flank pain, frequency, hematuria, urgency, vaginal bleeding, vaginal discharge and vaginal pain.  Musculoskeletal: Negative for back pain.  Skin: Negative for rash.    Physical Exam:  BP 120/80   Pulse 65   Ht 5\' 9"  (1.753 m)   Wt 226 lb (102.5 kg)   BMI 33.37 kg/m  Body mass index is 33.37 kg/m.  Pelvic exam:  Two IUD strings present seen coming from the cervical os. EGBUS, vaginal vault and cervix: within normal limits   Assessment:   Encounter for routine checking of intrauterine contraceptive device (IUD) - Doing well. F/u prn.   Cervical cancer screening - Plan: IGP, Aptima HPV  Screening for HPV (human papillomavirus) - Plan: IGP, Aptima HPV  IUD strings present in proper location; pt doing well  Plan: F/u if any signs of infection or can no longer feel the strings.   Alicia B. Copland, PA-C 01/31/2018 3:38 PM

## 2018-01-31 NOTE — Patient Instructions (Signed)
I value your feedback and entrusting us with your care. If you get a [facility] patient survey, I would appreciate you taking the time to let us know about your experience today. Thank you! 

## 2018-02-03 LAB — IGP, APTIMA HPV
HPV APTIMA: NEGATIVE
PAP Smear Comment: 0

## 2018-02-17 ENCOUNTER — Ambulatory Visit (INDEPENDENT_AMBULATORY_CARE_PROVIDER_SITE_OTHER): Payer: BC Managed Care – PPO | Admitting: Family Medicine

## 2018-02-17 ENCOUNTER — Telehealth: Payer: Self-pay

## 2018-02-17 ENCOUNTER — Encounter: Payer: Self-pay | Admitting: Family Medicine

## 2018-02-17 VITALS — BP 122/82 | HR 97 | Temp 98.1°F | Resp 14 | Ht 68.0 in | Wt 223.2 lb

## 2018-02-17 DIAGNOSIS — N6313 Unspecified lump in the right breast, lower outer quadrant: Secondary | ICD-10-CM

## 2018-02-17 DIAGNOSIS — K640 First degree hemorrhoids: Secondary | ICD-10-CM

## 2018-02-17 DIAGNOSIS — Z0000 Encounter for general adult medical examination without abnormal findings: Secondary | ICD-10-CM

## 2018-02-17 DIAGNOSIS — Z1239 Encounter for other screening for malignant neoplasm of breast: Secondary | ICD-10-CM

## 2018-02-17 DIAGNOSIS — Z1231 Encounter for screening mammogram for malignant neoplasm of breast: Secondary | ICD-10-CM | POA: Diagnosis not present

## 2018-02-17 DIAGNOSIS — K5900 Constipation, unspecified: Secondary | ICD-10-CM | POA: Diagnosis not present

## 2018-02-17 DIAGNOSIS — K625 Hemorrhage of anus and rectum: Secondary | ICD-10-CM

## 2018-02-17 LAB — CBC WITH DIFFERENTIAL/PLATELET
BASOS ABS: 80 {cells}/uL (ref 0–200)
BASOS PCT: 1.4 %
EOS PCT: 1.9 %
Eosinophils Absolute: 108 cells/uL (ref 15–500)
HEMATOCRIT: 45.9 % — AB (ref 35.0–45.0)
HEMOGLOBIN: 15.9 g/dL — AB (ref 11.7–15.5)
LYMPHS ABS: 1454 {cells}/uL (ref 850–3900)
MCH: 30.1 pg (ref 27.0–33.0)
MCHC: 34.6 g/dL (ref 32.0–36.0)
MCV: 86.8 fL (ref 80.0–100.0)
MONOS PCT: 8.6 %
MPV: 11.3 fL (ref 7.5–12.5)
NEUTROS ABS: 3568 {cells}/uL (ref 1500–7800)
Neutrophils Relative %: 62.6 %
Platelets: 287 10*3/uL (ref 140–400)
RBC: 5.29 10*6/uL — AB (ref 3.80–5.10)
RDW: 12.4 % (ref 11.0–15.0)
Total Lymphocyte: 25.5 %
WBC mixed population: 490 cells/uL (ref 200–950)
WBC: 5.7 10*3/uL (ref 3.8–10.8)

## 2018-02-17 LAB — COMPLETE METABOLIC PANEL WITH GFR
AG RATIO: 2 (calc) (ref 1.0–2.5)
ALKALINE PHOSPHATASE (APISO): 71 U/L (ref 33–115)
ALT: 20 U/L (ref 6–29)
AST: 18 U/L (ref 10–35)
Albumin: 4.5 g/dL (ref 3.6–5.1)
BILIRUBIN TOTAL: 0.5 mg/dL (ref 0.2–1.2)
BUN: 8 mg/dL (ref 7–25)
CHLORIDE: 104 mmol/L (ref 98–110)
CO2: 25 mmol/L (ref 20–32)
Calcium: 9.2 mg/dL (ref 8.6–10.2)
Creat: 0.65 mg/dL (ref 0.50–1.10)
GFR, EST AFRICAN AMERICAN: 123 mL/min/{1.73_m2} (ref >=60)
GFR, Est Non African American: 106 mL/min/{1.73_m2} (ref >=60)
GLUCOSE: 87 mg/dL (ref 65–99)
Globulin: 2.3 g/dL (calc) (ref 1.9–3.7)
POTASSIUM: 4.7 mmol/L (ref 3.5–5.3)
Sodium: 137 mmol/L (ref 135–146)
Total Protein: 6.8 g/dL (ref 6.1–8.1)

## 2018-02-17 LAB — LIPID PANEL
CHOLESTEROL: 168 mg/dL (ref <200)
HDL: 50 mg/dL — ABNORMAL LOW (ref >50)
LDL Cholesterol (Calc): 102 mg/dL (calc) — ABNORMAL HIGH
Non-HDL Cholesterol (Calc): 118 mg/dL (calc) (ref <130)
Total CHOL/HDL Ratio: 3.4 (calc) (ref <5.0)
Triglycerides: 73 mg/dL (ref <150)

## 2018-02-17 LAB — TSH: TSH: 2.45 m[IU]/L

## 2018-02-17 MED ORDER — POLYETHYLENE GLYCOL 3350 17 G PO PACK: 17.0000 g | PACK | Freq: Every day | ORAL | 11 refills | Status: DC

## 2018-02-17 NOTE — Assessment & Plan Note (Signed)
USPSTF grade A and B recommendations reviewed with patient; age-appropriate recommendations, preventive care, screening tests, etc discussed and encouraged; healthy living encouraged; see AVS for patient education given to patient  

## 2018-02-17 NOTE — Assessment & Plan Note (Signed)
Stable, refilled her miralax; had colonoscopy; evaluated by Dr. Tobi BastosAnna (GI) already

## 2018-02-17 NOTE — Progress Notes (Signed)
Patient ID: Elizabeth Case, female   DOB: [birth year], 47 y.o.   MRN: [id number]   Subjective:   Elizabeth Case is a 47 y.o. female here for a complete physical exam  Interim issues since last visit:   USPSTF grade A and B recommendations Depression:  Depression screen [facility] 2/9 02/17/2018 10/03/2017 02/08/2017 08/25/2016 02/02/2016  Decreased Interest 0 0 0 0 0  Down, Depressed, Hopeless 0 0 0 0 0  PHQ - 2 Score 0 0 0 0 0   Hypertension: BP Readings from Last 3 Encounters:  02/17/18 122/82  01/31/18 120/80  10/03/17 118/80   Obesity: friend told her about product for weight loss Wt Readings from Last 3 Encounters:  02/17/18 223 lb 3.2 oz (101.2 kg)  01/31/18 226 lb (102.5 kg)  12/28/17 224 lb (101.6 kg)   BMI Readings from Last 3 Encounters:  02/17/18 33.94 kg/m  01/31/18 33.37 kg/m  12/28/17 33.08 kg/m    Skin cancer: no worrisome moles Lung cancer:  nonsmoker Breast cancer: no lumps Colorectal cancer: no fam hx, start at age 23; had a colonoscopy a few years ago actually, UTD Cervical cancer screening: done by GYN during IUD check BRCA gene screening: family hx of breast and/or ovarian cancer and/or metastatic prostate cancer? Father diagnosed with prostate cancer, age 63; starts radiation; caught it early, PSA only a 6 HIV, hep B, hep C: not interested STD testing and prevention (chl/gon/syphilis): not interested Intimate partner violence: no abuse Contraception: IUD Osteoporosis: no steroids Fall prevention/vitamin D: discussed Immunizations: UTD Diet: good eater, juices some fruits and vegetables Exercise: room for improvement Alcohol: occasional Tobacco use: no AAA: n/a Aspirin:n/a Glucose: check today Glucose, Bld  Date Value Ref Range Status  02/08/2017 79 65 - 99 mg/dL Final   Lipids:  Lab Results  Component Value Date   CHOL 158 02/08/2017   Lab Results  Component Value Date   HDL 42 (L) 02/08/2017   Lab Results  Component Value Date   LDLCALC 96 02/08/2017   Lab Results  Component Value Date   TRIG 102 02/08/2017   Lab Results  Component Value Date   CHOLHDL 3.8 02/08/2017   No results found for: LDLDIRECT   Past Medical History:  Diagnosis Date  . Allergic rhinitis   . Eczema   . GERD (gastroesophageal reflux disease)   . Migraines    1x/mo  . Vitamin D deficiency disease    Past Surgical History:  Procedure Laterality Date  . BREAST REDUCTION SURGERY  1994  . BREAST SURGERY Right 2012   lumpectomy  . CHOLECYSTECTOMY  1998  . COLONOSCOPY WITH PROPOFOL N/A 09/08/2016   Procedure: COLONOSCOPY WITH PROPOFOL;  Surgeon: Jonathon Bellows, MD;  Location: [facility];  Service: Endoscopy;  Laterality: N/A;  Latex sensitivity   Family History  Problem Relation Age of Onset  . Hypertension Mother   . Pulmonary fibrosis Mother   . Hypertension Father   . Cancer Paternal Grandmother        breast  . Stroke Paternal Grandfather        cerebral aneurysm caused the stroke  . Aneurysm Paternal Grandfather   . Stroke Maternal Grandmother   . Diabetes Neg Hx   . Heart disease Neg Hx    Social History   Tobacco Use  . Smoking status: Never Smoker  . Smokeless tobacco: Never Used  Substance Use Topics  . Alcohol use: Yes    Alcohol/week: 0.0 oz    Comment: holidays  .  Drug use: No   Review of Systems  Objective:   Vitals:   02/17/18 0811  BP: 122/82  Pulse: 97  Resp: 14  Temp: 98.1 F (36.7 C)  TempSrc: Oral  SpO2: 96%  Weight: 223 lb 3.2 oz (101.2 kg)  Height: '5\' 8"'  (1.727 m)   Body mass index is 33.94 kg/m. Wt Readings from Last 3 Encounters:  02/17/18 223 lb 3.2 oz (101.2 kg)  01/31/18 226 lb (102.5 kg)  12/28/17 224 lb (101.6 kg)   Physical Exam  Constitutional: She appears well-developed and well-nourished.  HENT:  Head: Normocephalic and atraumatic.  Right Ear: Hearing, tympanic membrane, external ear and ear canal normal.  Left Ear: Hearing, tympanic membrane, external ear  and ear canal normal.  Eyes: Conjunctivae and EOM are normal. Right eye exhibits no hordeolum. Left eye exhibits no hordeolum. No scleral icterus.  Neck: Carotid bruit is not present. No thyromegaly present.  Cardiovascular: Normal rate, regular rhythm, S1 normal, S2 normal and normal heart sounds.  No extrasystoles are present.  Pulmonary/Chest: Effort normal and breath sounds normal. No respiratory distress. Right breast exhibits mass and tenderness. Right breast exhibits no inverted nipple, no nipple discharge and no skin change. Left breast exhibits tenderness. Left breast exhibits no inverted nipple, no mass, no nipple discharge and no skin change. Breasts are symmetrical.  Scars c/w reduction mammoplasty    Abdominal: Soft. Normal appearance and bowel sounds are normal. She exhibits no distension, no abdominal bruit, no pulsatile midline mass and no mass. There is no hepatosplenomegaly. There is no tenderness. No hernia.  Musculoskeletal: Normal range of motion. She exhibits no edema.  Lymphadenopathy:       Head (right side): No submandibular adenopathy present.       Head (left side): No submandibular adenopathy present.    She has no cervical adenopathy.    She has no axillary adenopathy.  Neurological: She is alert. She displays no tremor. No cranial nerve deficit. She exhibits normal muscle tone. Gait normal.  Reflex Scores:      Patellar reflexes are 2+ on the right side and 2+ on the left side. Skin: Skin is warm and dry. No bruising and no ecchymosis noted. No cyanosis. No pallor.  Psychiatric: Her speech is normal and behavior is normal. Thought content normal. Her mood appears not anxious. She does not exhibit a depressed mood.    Assessment/Plan:   Problem List Items Addressed This Visit      Cardiovascular and Mediastinum   First degree hemorrhoids    Stable, refilled her miralax; had colonoscopy; evaluated by Dr. Vicente Males (GI) already        Other   Constipation    Relevant Medications   polyethylene glycol (MIRALAX / GLYCOLAX) packet   Preventative health care - Primary    USPSTF grade A and B recommendations reviewed with patient; age-appropriate recommendations, preventive care, screening tests, etc discussed and encouraged; healthy living encouraged; see AVS for patient education given to patient       Relevant Orders   CBC with Differential/Platelet   COMPLETE METABOLIC PANEL WITH GFR   Lipid panel   TSH    Other Visit Diagnoses    Screening for breast cancer       Breast lump on right side at 7 o'clock position       Relevant Orders   US BREAST COMPLETE UNI RIGHT INC AXILLA   US BREAST COMPLETE UNI LEFT INC AXILLA   MM DIAG BREAST TOMO BILATERAL  Rectal bleeding       Relevant Medications   polyethylene glycol (MIRALAX / GLYCOLAX) packet       Meds ordered this encounter  Medications  . polyethylene glycol (MIRALAX / GLYCOLAX) packet    Sig: Take 17 g by mouth daily.    Dispense:  30 each    Refill:  11   Orders Placed This Encounter  Procedures  . US BREAST COMPLETE UNI RIGHT INC AXILLA    Standing Status:   Future    Standing Expiration Date:   05/19/2018    Order Specific Question:   Reason for Exam (SYMPTOM  OR DIAGNOSIS REQUIRED)    Answer:   tender lump lateral RIGHT breast and LEFT breast, R>>L    Order Specific Question:   Preferred imaging location?    Answer:   [facility]  . US BREAST COMPLETE UNI LEFT INC AXILLA    Standing Status:   Future    Standing Expiration Date:   05/19/2018    Order Specific Question:   Reason for Exam (SYMPTOM  OR DIAGNOSIS REQUIRED)    Answer:   tender lump lateral RIGHT breast and LEFT breast, R>>L    Order Specific Question:   Preferred imaging location?    Answer:   [facility]  . MM DIAG BREAST TOMO BILATERAL    Standing Status:   Future    Standing Expiration Date:   05/19/2018    Order Specific Question:   Reason for Exam (SYMPTOM  OR DIAGNOSIS REQUIRED)    Answer:    tender lump lateral RIGHT breast and LEFT breast, R>>L    Order Specific Question:   Is the patient pregnant?    Answer:   No    Order Specific Question:   Preferred imaging location?    Answer:   [facility]  . CBC with Differential/Platelet  . COMPLETE METABOLIC PANEL WITH GFR  . Lipid panel  . TSH    Follow up plan: Return in about 1 year (around 02/18/2019) for complete physical.  An After Visit Summary was printed and given to the patient.

## 2018-02-17 NOTE — Telephone Encounter (Signed)
Patient was informed that she has been scheduled for her mammogram on 02/28/18 @ 9:40am while she was checking out.    A new AVS was printed and given to her along with their card if she needed to reschedule.

## 2018-02-17 NOTE — Patient Instructions (Addendum)
Please call [phone] to schedule your imaging test Please wait 2-3 days after the order has been placed to call and get your test scheduled We'll get labs today If you have not heard anything from my staff in a week about any orders/referrals/studies from today, please contact us here to follow-up [phone] Check out the information at [email or web address] entitled "Nutrition for Weight Loss: What You Need to Know about Fad Diets" Try to lose between 1-2 pounds per week by taking in fewer calories and burning off more calories You can succeed by limiting portions, limiting foods dense in calories and fat, becoming more active, and drinking 8 glasses of water a day (64 ounces) Don't skip meals, especially breakfast, as skipping meals may alter your metabolism Do not use over-the-counter weight loss pills or gimmicks that claim rapid weight loss A healthy BMI (or body mass index) is between 18.5 and 24.9 You can calculate your ideal BMI at the [employer] website [email or web address].fr 150 minutes of walking or other exercise a week  Health Maintenance, Female Adopting a healthy lifestyle and getting preventive care can go a long way to promote health and wellness. Talk with your health care provider about what schedule of regular examinations is right for you. This is a good chance for you to check in with your provider about disease prevention and staying healthy. In between checkups, there are plenty of things you can do on your own. Experts have done a lot of research about which lifestyle changes and preventive measures are most likely to keep you healthy. Ask your health care provider for more information. Weight and diet Eat a healthy diet  Be sure to include plenty of vegetables, fruits, low-fat dairy products, and lean protein.  Do not eat a lot of foods high in solid fats, added sugars, or salt.  Get regular exercise. This is one of the most  important things you can do for your health. ? Most adults should exercise for at least 150 minutes each week. The exercise should increase your heart rate and make you sweat (moderate-intensity exercise). ? Most adults should also do strengthening exercises at least twice a week. This is in addition to the moderate-intensity exercise.  Maintain a healthy weight  Body mass index (BMI) is a measurement that can be used to identify possible weight problems. It estimates body fat based on height and weight. Your health care provider can help determine your BMI and help you achieve or maintain a healthy weight.  For females 62 years of age and older: ? A BMI below 18.5 is considered underweight. ? A BMI of 18.5 to 24.9 is normal. ? A BMI of 25 to 29.9 is considered overweight. ? A BMI of 30 and above is considered obese.  Watch levels of cholesterol and blood lipids  You should start having your blood tested for lipids and cholesterol at 46 years of age, then have this test every 5 years.  You may need to have your cholesterol levels checked more often if: ? Your lipid or cholesterol levels are high. ? You are older than 46 years of age. ? You are at high risk for heart disease.  Cancer screening Lung Cancer  Lung cancer screening is recommended for adults 64-38 years old who are at high risk for lung cancer because of a history of smoking.  A yearly low-dose CT scan of the lungs is recommended for people who: ? Currently smoke. ? Have quit within the past 15 years. ?  Have at least a 30-pack-year history of smoking. A pack year is smoking an average of one pack of cigarettes a day for 1 year.  Yearly screening should continue until it has been 15 years since you quit.  Yearly screening should stop if you develop a health problem that would prevent you from having lung cancer treatment.  Breast Cancer  Practice breast self-awareness. This means understanding how your breasts  normally appear and feel.  It also means doing regular breast self-exams. Let your health care provider know about any changes, no matter how small.  If you are in your 20s or 30s, you should have a clinical breast exam (CBE) by a health care provider every 1-3 years as part of a regular health exam.  If you are 55 or older, have a CBE every year. Also consider having a breast X-ray (mammogram) every year.  If you have a family history of breast cancer, talk to your health care provider about genetic screening.  If you are at high risk for breast cancer, talk to your health care provider about having an MRI and a mammogram every year.  Breast cancer gene (BRCA) assessment is recommended for women who have family members with BRCA-related cancers. BRCA-related cancers include: ? Breast. ? Ovarian. ? Tubal. ? Peritoneal cancers.  Results of the assessment will determine the need for genetic counseling and BRCA1 and BRCA2 testing.  Cervical Cancer Your health care provider may recommend that you be screened regularly for cancer of the pelvic organs (ovaries, uterus, and vagina). This screening involves a pelvic examination, including checking for microscopic changes to the surface of your cervix (Pap test). You may be encouraged to have this screening done every 3 years, beginning at age 5.  For women ages 11-65, health care providers may recommend pelvic exams and Pap testing every 3 years, or they may recommend the Pap and pelvic exam, combined with testing for human papilloma virus (HPV), every 5 years. Some types of HPV increase your risk of cervical cancer. Testing for HPV may also be done on women of any age with unclear Pap test results.  Other health care providers may not recommend any screening for nonpregnant women who are considered low risk for pelvic cancer and who do not have symptoms. Ask your health care provider if a screening pelvic exam is right for you.  If you have had  past treatment for cervical cancer or a condition that could lead to cancer, you need Pap tests and screening for cancer for at least 20 years after your treatment. If Pap tests have been discontinued, your risk factors (such as having a new sexual partner) need to be reassessed to determine if screening should resume. Some women have medical problems that increase the chance of getting cervical cancer. In these cases, your health care provider may recommend more frequent screening and Pap tests.  Colorectal Cancer  This type of cancer can be detected and often prevented.  Routine colorectal cancer screening usually begins at 46 years of age and continues through 46 years of age.  Your health care provider may recommend screening at an earlier age if you have risk factors for colon cancer.  Your health care provider may also recommend using home test kits to check for hidden blood in the stool.  A small camera at the end of a tube can be used to examine your colon directly (sigmoidoscopy or colonoscopy). This is done to check for the earliest forms of colorectal  cancer.  Routine screening usually begins at age 93.  Direct examination of the colon should be repeated every 5-10 years through 46 years of age. However, you may need to be screened more often if early forms of precancerous polyps or small growths are found.  Skin Cancer  Check your skin from head to toe regularly.  Tell your health care provider about any new moles or changes in moles, especially if there is a change in a mole's shape or color.  Also tell your health care provider if you have a mole that is larger than the size of a pencil eraser.  Always use sunscreen. Apply sunscreen liberally and repeatedly throughout the day.  Protect yourself by wearing long sleeves, pants, a wide-brimmed hat, and sunglasses whenever you are outside.  Heart disease, diabetes, and high blood pressure  High blood pressure causes heart  disease and increases the risk of stroke. High blood pressure is more likely to develop in: ? People who have blood pressure in the high end of the normal range (130-139/85-89 mm Hg). ? People who are overweight or obese. ? People who are African American.  If you are 60-34 years of age, have your blood pressure checked every 3-5 years. If you are 25 years of age or older, have your blood pressure checked every year. You should have your blood pressure measured twice-once when you are at a hospital or clinic, and once when you are not at a hospital or clinic. Record the average of the two measurements. To check your blood pressure when you are not at a hospital or clinic, you can use: ? An automated blood pressure machine at a pharmacy. ? A home blood pressure monitor.  If you are between 62 years and 26 years old, ask your health care provider if you should take aspirin to prevent strokes.  Have regular diabetes screenings. This involves taking a blood sample to check your fasting blood sugar level. ? If you are at a normal weight and have a low risk for diabetes, have this test once every three years after 46 years of age. ? If you are overweight and have a high risk for diabetes, consider being tested at a younger age or more often. Preventing infection Hepatitis B  If you have a higher risk for hepatitis B, you should be screened for this virus. You are considered at high risk for hepatitis B if: ? You were born in a country where hepatitis B is common. Ask your health care provider which countries are considered high risk. ? Your parents were born in a high-risk country, and you have not been immunized against hepatitis B (hepatitis B vaccine). ? You have HIV or AIDS. ? You use needles to inject street drugs. ? You live with someone who has hepatitis B. ? You have had sex with someone who has hepatitis B. ? You get hemodialysis treatment. ? You take certain medicines for conditions,  including cancer, organ transplantation, and autoimmune conditions.  Hepatitis C  Blood testing is recommended for: ? Everyone born from [birth year] through 1964. ? Anyone with known risk factors for hepatitis C.  Sexually transmitted infections (STIs)  You should be screened for sexually transmitted infections (STIs) including gonorrhea and chlamydia if: ? You are sexually active and are younger than 46 years of age. ? You are older than 46 years of age and your health care provider tells you that you are at risk for this type of infection. ? Your  sexual activity has changed since you were last screened and you are at an increased risk for chlamydia or gonorrhea. Ask your health care provider if you are at risk.  If you do not have HIV, but are at risk, it may be recommended that you take a prescription medicine daily to prevent HIV infection. This is called pre-exposure prophylaxis (PrEP). You are considered at risk if: ? You are sexually active and do not regularly use condoms or know the HIV status of your partner(s). ? You take drugs by injection. ? You are sexually active with a partner who has HIV.  Talk with your health care provider about whether you are at high risk of being infected with HIV. If you choose to begin PrEP, you should first be tested for HIV. You should then be tested every 3 months for as long as you are taking PrEP. Pregnancy  If you are premenopausal and you may become pregnant, ask your health care provider about preconception counseling.  If you may become pregnant, take 400 to 800 micrograms (mcg) of folic acid every day.  If you want to prevent pregnancy, talk to your health care provider about birth control (contraception). Osteoporosis and menopause  Osteoporosis is a disease in which the bones lose minerals and strength with aging. This can result in serious bone fractures. Your risk for osteoporosis can be identified using a bone density scan.  If you are  58 years of age or older, or if you are at risk for osteoporosis and fractures, ask your health care provider if you should be screened.  Ask your health care provider whether you should take a calcium or vitamin D supplement to lower your risk for osteoporosis.  Menopause may have certain physical symptoms and risks.  Hormone replacement therapy may reduce some of these symptoms and risks. Talk to your health care provider about whether hormone replacement therapy is right for you. Follow these instructions at home:  Schedule regular health, dental, and eye exams.  Stay current with your immunizations.  Do not use any tobacco products including cigarettes, chewing tobacco, or electronic cigarettes.  If you are pregnant, do not drink alcohol.  If you are breastfeeding, limit how much and how often you drink alcohol.  Limit alcohol intake to no more than 1 drink per day for nonpregnant women. One drink equals 12 ounces of beer, 5 ounces of wine, or 1 ounces of hard liquor.  Do not use street drugs.  Do not share needles.  Ask your health care provider for help if you need support or information about quitting drugs.  Tell your health care provider if you often feel depressed.  Tell your health care provider if you have ever been abused or do not feel safe at home. This information is not intended to replace advice given to you by your health care provider. Make sure you discuss any questions you have with your health care provider. Document Released: 05/03/2011 Document Revised: 03/25/2016 Document Reviewed: 07/22/2015 Elsevier Interactive Patient Education  2018 [employer].  Obesity, Adult Obesity is the condition of having too much total body fat. Being overweight or obese means that your weight is greater than what is considered healthy for your body size. Obesity is determined by a measurement called BMI. BMI is an estimate of body fat and is calculated from height and  weight. For adults, a BMI of 30 or higher is considered obese. Obesity can eventually lead to other health concerns and major  illnesses, including:  Stroke.  Coronary artery disease (CAD).  Type 2 diabetes.  Some types of cancer, including cancers of the colon, breast, uterus, and gallbladder.  Osteoarthritis.  High blood pressure (hypertension).  High cholesterol.  Sleep apnea.  Gallbladder stones.  Infertility problems.  What are the causes? The main cause of obesity is taking in (consuming) more calories than your body uses for energy. Other factors that contribute to this condition may include:  Being born with genes that make you more likely to become obese.  Having a medical condition that causes obesity. These conditions include: ? Hypothyroidism. ? Polycystic ovarian syndrome (PCOS). ? Binge-eating disorder. ? Cushing syndrome.  Taking certain medicines, such as steroids, antidepressants, and seizure medicines.  Not being physically active (sedentary lifestyle).  Living where there are limited places to exercise safely or buy healthy foods.  Not getting enough sleep.  What increases the risk? The following factors may increase your risk of this condition:  Having a family history of obesity.  Being a woman of African-American descent.  Being a man of Hispanic descent.  What are the signs or symptoms? Having excessive body fat is the main symptom of this condition. How is this diagnosed? This condition may be diagnosed based on:  Your symptoms.  Your medical history.  A physical exam. Your health care provider may measure: ? Your BMI. If you are an adult with a BMI between 25 and less than 30, you are considered overweight. If you are an adult with a BMI of 30 or higher, you are considered obese. ? The distances around your hips and your waist (circumferences). These may be compared to each other to help diagnose your condition. ? Your skinfold  thickness. Your health care provider may gently pinch a fold of your skin and measure it.  How is this treated? Treatment for this condition often includes changing your lifestyle. Treatment may include some or all of the following:  Dietary changes. Work with your health care provider and a dietitian to set a weight-loss goal that is healthy and reasonable for you. Dietary changes may include eating: ? Smaller portions. A portion size is the amount of a particular food that is healthy for you to eat at one time. This varies from person to person. ? Low-calorie or low-fat options. ? More whole grains, fruits, and vegetables.  Regular physical activity. This may include aerobic activity (cardio) and strength training.  Medicine to help you lose weight. Your health care provider may prescribe medicine if you are unable to lose 1 pound a week after 6 weeks of eating more healthily and doing more physical activity.  Surgery. Surgical options may include gastric banding and gastric bypass. Surgery may be done if: ? Other treatments have not helped to improve your condition. ? You have a BMI of 40 or higher. ? You have life-threatening health problems related to obesity.  Follow these instructions at home:  Eating and drinking   Follow recommendations from your health care provider about what you eat and drink. Your health care provider may advise you to: ? Limit fast foods, sweets, and processed snack foods. ? Choose low-fat options, such as low-fat milk instead of whole milk. ? Eat 5 or more servings of fruits or vegetables every day. ? Eat at home more often. This gives you more control over what you eat. ? Choose healthy foods when you eat out. ? Learn what a healthy portion size is. ? Keep low-fat snacks on  hand. ? Avoid sugary drinks, such as soda, fruit juice, iced tea sweetened with sugar, and flavored milk. ? Eat a healthy breakfast.  Drink enough water to keep your urine clear  or pale yellow.  Do not go without eating for long periods of time (do not fast) or follow a fad diet. Fasting and fad diets can be unhealthy and even dangerous. Physical Activity  Exercise regularly, as told by your health care provider. Ask your health care provider what types of exercise are safe for you and how often you should exercise.  Warm up and stretch before being active.  Cool down and stretch after being active.  Rest between periods of activity. Lifestyle  Limit the time that you spend in front of your TV, computer, or video game system.  Find ways to reward yourself that do not involve food.  Limit alcohol intake to no more than 1 drink a day for nonpregnant women and 2 drinks a day for men. One drink equals 12 oz of beer, 5 oz of wine, or 1 oz of hard liquor. General instructions  Keep a weight loss journal to keep track of the food you eat and how much you exercise you get.  Take over-the-counter and prescription medicines only as told by your health care provider.  Take vitamins and supplements only as told by your health care provider.  Consider joining a support group. Your health care provider may be able to recommend a support group.  Keep all follow-up visits as told by your health care provider. This is important. Contact a health care provider if:  You are unable to meet your weight loss goal after 6 weeks of dietary and lifestyle changes. This information is not intended to replace advice given to you by your health care provider. Make sure you discuss any questions you have with your health care provider. Document Released: 11/25/2004 Document Revised: 03/22/2016 Document Reviewed: 08/06/2015 Elsevier Interactive Patient Education  2018 [employer].  Preventing Unhealthy [employer], Adult Staying at a healthy weight is important. When fat builds up in your body, you may become overweight or obese. These conditions put you at greater risk for  developing certain health problems, such as heart disease, diabetes, sleeping problems, joint problems, and some cancers. Unhealthy weight gain is often the result of making unhealthy choices in what you eat. It is also a result of not getting enough exercise. You can make changes to your lifestyle to prevent obesity and stay as healthy as possible. What nutrition changes can be made? To maintain a healthy weight and prevent obesity:  Eat only as much as your body needs. To do this: ? Pay attention to signs that you are hungry or full. Stop eating as soon as you feel full. ? If you feel hungry, try drinking water first. Drink enough water so your urine is clear or pale yellow. ? Eat smaller portions. ? Look at serving sizes on food labels. Most foods contain more than one serving per container. ? Eat the recommended amount of calories for your gender and activity level. While most active people should eat around 2,000 calories per day, if you are trying to lose weight or are not very active, you main need to eat less calories. Talk to your health care provider or dietitian about how many calories you should eat each day.  Choose healthy foods, such as: ? Fruits and vegetables. Try to fill at least half of your plate at each meal with  fruits and vegetables. ? Whole grains, such as whole wheat bread, brown rice, and quinoa. ? Lean meats, such as chicken or fish. ? Other healthy proteins, such as beans, eggs, or tofu. ? Healthy fats, such as nuts, seeds, fatty fish, and olive oil. ? Low-fat or fat-free dairy.  Check food labels and avoid food and drinks that: ? Are high in calories. ? Have added sugar. ? Are high in sodium. ? Have saturated fats or trans fats.  Limit how much you eat of the following foods: ? Prepackaged meals. ? Fast food. ? Fried foods. ? Processed meat, such as bacon, sausage, and deli meats. ? Fatty cuts of red meat and poultry with skin.  Cook foods in healthier  ways, such as by baking, broiling, or grilling.  When grocery shopping, try to shop around the outside of the store. This helps you buy mostly fresh foods and avoid canned and prepackaged foods.  What lifestyle changes can be made?  Exercise at least 30 minutes 5 or more days each week. Exercising includes brisk walking, yard work, biking, running, swimming, and team sports like basketball and soccer. Ask your health care provider which exercises are safe for you.  Do not use any products that contain nicotine or tobacco, such as cigarettes and e-cigarettes. If you need help quitting, ask your health care provider.  Limit alcohol intake to no more than 1 drink a day for nonpregnant women and 2 drinks a day for men. One drink equals 12 oz of beer, 5 oz of wine, or 1 oz of hard liquor.  Try to get 7-9 hours of sleep each night. What other changes can be made?  Keep a food and activity journal to keep track of: ? What you ate and how many calories you had. Remember to count sauces, dressings, and side dishes. ? Whether you were active, and what exercises you did. ? Your calorie, weight, and activity goals.  Check your weight regularly. Track any changes. If you notice you have gained weight, make changes to your diet or activity routine.  Avoid taking weight-loss medicines or supplements. Talk to your health care provider before starting any new medicine or supplement.  Talk to your health care provider before trying any new diet or exercise plan. Why are these changes important? Eating healthy, staying active, and having healthy habits not only help prevent obesity, they also:  Help you to manage stress and emotions.  Help you to connect with friends and family.  Improve your self-esteem.  Improve your sleep.  Prevent long-term health problems.  What can happen if changes are not made? Being obese or overweight can cause you to develop joint or bone problems, which can make it  hard for you to stay active or do activities you enjoy. Being obese or overweight also puts stress on your heart and lungs and can lead to health problems like diabetes, heart disease, and some cancers. Where to find more information: Talk with your health care provider or a dietitian about healthy eating and healthy lifestyle choices. You may also find other information through these resources:  [place] Department of Agriculture MyPlate: FormerBoss.no  American Heart Association: www.heart.org  Centers for Disease Control and Prevention: http://www.wolf.info/  Summary  Staying at a healthy weight is important. It helps prevent certain diseases and health problems, such as heart disease, diabetes, joint problems, sleep disorders, and some cancers.  Being obese or overweight can cause you to develop joint or bone problems, which can  make it hard for you to stay active or do activities you enjoy.  You can prevent unhealthy weight gain by eating a healthy diet, exercising regularly, not smoking, limiting alcohol, and getting enough sleep.  Talk with your health care provider or a dietitian for guidance about healthy eating and healthy lifestyle choices. This information is not intended to replace advice given to you by your health care provider. Make sure you discuss any questions you have with your health care provider. Document Released: 10/19/2016 Document Revised: 11/24/2016 Document Reviewed: 11/24/2016 Elsevier Interactive Patient Education  [employer].

## 2018-02-22 ENCOUNTER — Other Ambulatory Visit: Payer: Self-pay | Admitting: Family Medicine

## 2018-02-22 DIAGNOSIS — R718 Other abnormality of red blood cells: Secondary | ICD-10-CM

## 2018-02-22 NOTE — Progress Notes (Signed)
pulm referral for high RBC, H/H

## 2018-02-28 ENCOUNTER — Ambulatory Visit
Admission: RE | Admit: 2018-02-28 | Discharge: 2018-02-28 | Disposition: A | Discharge status: Home / Self Care | Payer: BC Managed Care – PPO | Source: Ambulatory Visit | Attending: Family Medicine | Admitting: Family Medicine

## 2018-02-28 DIAGNOSIS — N6313 Unspecified lump in the right breast, lower outer quadrant: Secondary | ICD-10-CM | POA: Diagnosis not present

## 2018-02-28 DIAGNOSIS — Z9889 Other specified postprocedural states: Secondary | ICD-10-CM | POA: Diagnosis not present

## 2018-02-28 DIAGNOSIS — N6002 Solitary cyst of left breast: Secondary | ICD-10-CM | POA: Diagnosis not present

## 2018-02-28 DIAGNOSIS — N644 Mastodynia: Secondary | ICD-10-CM | POA: Insufficient documentation

## 2018-03-07 ENCOUNTER — Encounter: Payer: Self-pay | Admitting: General Surgery

## 2018-09-20 ENCOUNTER — Ambulatory Visit: Payer: BC Managed Care – PPO | Admitting: Nurse Practitioner

## 2018-09-20 ENCOUNTER — Encounter: Payer: Self-pay | Admitting: Nurse Practitioner

## 2018-09-20 VITALS — BP 120/80 | HR 95 | Temp 98.2°F | Resp 16 | Ht 68.0 in | Wt 230.3 lb

## 2018-09-20 DIAGNOSIS — J40 Bronchitis, not specified as acute or chronic: Secondary | ICD-10-CM | POA: Diagnosis not present

## 2018-09-20 DIAGNOSIS — Z23 Encounter for immunization: Secondary | ICD-10-CM | POA: Diagnosis not present

## 2018-09-20 MED ORDER — PREDNISONE 20 MG PO TABS: 40.0000 mg | ORAL_TABLET | Freq: Every day | ORAL | 0 refills | Status: AC

## 2018-09-20 MED ORDER — PROMETHAZINE-DM 6.25-15 MG/5ML PO SYRP: 2.5000 mL | ORAL_SOLUTION | Freq: Every day | ORAL | 0 refills | Status: DC

## 2018-09-20 MED ORDER — BENZONATATE 200 MG PO CAPS: 200.0000 mg | ORAL_CAPSULE | Freq: Two times a day (BID) | ORAL | 0 refills | Status: DC | PRN

## 2018-09-20 NOTE — Progress Notes (Signed)
Name: Elizabeth Case   MRN: [id number]    DOB: 06-01-1971   Date:09/20/2018       Progress Note  Subjective  Chief Complaint  Chief Complaint  Patient presents with  . Sore Throat  . chest congestion    HPI  Patient endorses chest congestions and sore throat ongoing for a week. States got worse the last 2 days- worse the morning and through the day feels better. Mild sinus pressure.   Patient has been taking [facility] brand musinex.   Denies fevers, chills, chest pain, shortness of breath.  Patient Active Problem List   Diagnosis Date Noted  . Elevated red blood cell count 02/09/2017  . Preventative health care 02/09/2017  . Cervical cancer screening 02/08/2017  . First degree hemorrhoids   . Hemorrhage of rectum and anus 09/01/2016  . Constipation 09/01/2016  . NSAID long-term use 09/01/2016  . Allergic conjunctivitis 09/01/2015  . Allergic rhinitis   . Migraines   . Eczema   . Epigastric pain 02/24/2015    Past Medical History:  Diagnosis Date  . Allergic rhinitis   . Eczema   . GERD (gastroesophageal reflux disease)   . Migraines    1x/mo  . Vitamin D deficiency disease     Past Surgical History:  Procedure Laterality Date  . BREAST BIOPSY    . BREAST EXCISIONAL BIOPSY Right   . BREAST REDUCTION SURGERY  1994  . BREAST SURGERY Right 2012   lumpectomy  . CHOLECYSTECTOMY  1998  . COLONOSCOPY WITH PROPOFOL N/A 09/08/2016   Procedure: COLONOSCOPY WITH PROPOFOL;  Surgeon: Wyline MoodKiran Anna, MD;  [facility];  Service: Endoscopy;  Laterality: N/A;  Latex sensitivity  . REDUCTION MAMMAPLASTY Bilateral 1994    Social History   Tobacco Use  . Smoking status: Never Smoker  . Smokeless tobacco: Never Used  Substance Use Topics  . Alcohol use: Yes    Alcohol/week: 0.0 standard drinks    Comment: holidays     Current Outpatient Medications:  .  cholecalciferol (VITAMIN D) 1000 UNITS tablet, Take 1,000 Units by mouth daily., Disp: , Rfl:  .   desonide (DESOWEN) 0.05 % lotion, Apply 1 application topically 2 (two) times daily as needed., Disp: , Rfl:  .  polyethylene glycol (MIRALAX / GLYCOLAX) packet, Take 17 g by mouth daily., Disp: 30 each, Rfl: 11 .  triamcinolone cream (KENALOG) 0.5 %, Apply 1 application topically 3 (three) times daily., Disp: 30 g, Rfl: 6 .  fluticasone (FLONASE) 50 MCG/ACT nasal spray, Place 2 sprays into both nostrils daily. (Patient not taking: Reported on 09/20/2018), Disp: 16 g, Rfl: 6 .  levonorgestrel (MIRENA) 20 MCG/24HR IUD, by Intrauterine route once for 1 dose., Disp: 1 each, Rfl: 0 .  nortriptyline (PAMELOR) 10 MG capsule, Take 10 mg by mouth as needed. For migraine prophylaxis , Disp: , Rfl:  .  SUMAtriptan (IMITREX) 50 MG tablet, Take 50 mg by mouth every 2 (two) hours as needed for migraine. May repeat in 2 hours if headache persists or recurs., Disp: , Rfl:   Allergies  Allergen Reactions  . Kiwi Extract Shortness Of Breath  . Latex Dermatitis  . Other Swelling    Walnuts - angioedema    ROS   No other specific complaints in a complete review of systems (except as listed in HPI above).  Objective  Vitals:   09/20/18 0853  BP: 120/80  Pulse: 95  Resp: 16  Temp: 98.2 F (36.8 C)  TempSrc: Oral  SpO2: 96%  Weight: 230 lb 4.8 oz (104.5 kg)  Height: 5\' 8"  (1.727 m)     Body mass index is 35.02 kg/m.  Nursing Note and Vital Signs reviewed.  Physical Exam  Constitutional: She is oriented to person, place, and time. She appears well-developed and well-nourished.  HENT:  Head: Normocephalic.  Right Ear: Hearing and ear canal normal. No tenderness. Right ear drainage: patient is cerumen impacted.  Left Ear: Hearing, tympanic membrane and ear canal normal. No tenderness.  Mouth/Throat: Uvula is midline, oropharynx is clear and moist and mucous membranes are normal. No oral lesions. No posterior oropharyngeal erythema.  Eyes: Pupils are equal, round, and reactive to light. EOM  are normal.  Neck: Normal range of motion. Neck supple.  Cardiovascular: Normal rate and regular rhythm.  Pulmonary/Chest: Effort normal and breath sounds normal. She has no wheezes. She has no rales.  Neurological: She is alert and oriented to person, place, and time.  Skin: Skin is warm and dry. No rash noted.  Psychiatric: She has a normal mood and affect. Her behavior is normal.       No results found for this or any previous visit (from the past 48 hour(s)).  Assessment & Plan  1. Bronchitis - benzonatate (TESSALON) 200 MG capsule; Take 1 capsule (200 mg total) by mouth 2 (two) times daily as needed for cough.  Dispense: 20 capsule; Refill: 0 - promethazine-dextromethorphan (PROMETHAZINE-DM) 6.25-15 MG/5ML syrup; Take 2.5 mLs by mouth at bedtime.  Dispense: 118 mL; Refill: 0 - predniSONE (DELTASONE) 20 MG tablet; Take 2 tablets (40 mg total) by mouth daily with breakfast for 5 days.  Dispense: 10 tablet; Refill: 0  2. Flu vaccine need - Flu Vaccine QUAD 36+ mos IM

## 2018-09-20 NOTE — Patient Instructions (Addendum)
-   tessalon perls in the day time - PhenerganDM syrup at night - take Plain guaifenesin  - Look out for signs: worsening shortness of breath, fever, chills ect please come back for further evaluation   Acute Bronchitis, Adult Acute bronchitis is when air tubes (bronchi) in the lungs suddenly get swollen. The condition can make it hard to breathe. It can also cause these symptoms:  A cough.  Coughing up clear, yellow, or green mucus.  Wheezing.  Chest congestion.  Shortness of breath.  A fever.  Body aches.  Chills.  A sore throat.  Follow these instructions at home: Medicines  Take over-the-counter and prescription medicines only as told by your doctor.  If you were prescribed an antibiotic medicine, take it as told by your doctor. Do not stop taking the antibiotic even if you start to feel better. General instructions  Rest.  Drink enough fluids to keep your pee (urine) clear or pale yellow.  Avoid smoking and secondhand smoke. If you smoke and you need help quitting, ask your doctor. Quitting will help your lungs heal faster.  Use an inhaler, cool mist vaporizer, or humidifier as told by your doctor.  Keep all follow-up visits as told by your doctor. This is important. How is this prevented? To lower your risk of getting this condition again:  Wash your hands often with soap and water. If you cannot use soap and water, use hand sanitizer.  Avoid contact with people who have cold symptoms.  Try not to touch your hands to your mouth, nose, or eyes.  Make sure to get the flu shot every year.  Contact a doctor if:  Your symptoms do not get better in 2 weeks. Get help right away if:  You cough up blood.  You have chest pain.  You have very bad shortness of breath.  You become dehydrated.  You faint (pass out) or keep feeling like you are going to pass out.  You keep throwing up (vomiting).  You have a very bad headache.  Your fever or chills gets  worse. This information is not intended to replace advice given to you by your health care provider. Make sure you discuss any questions you have with your health care provider. Document Released: 04/05/2008 Document Revised: 05/26/2016 Document Reviewed: 04/07/2016 Elsevier Interactive Patient Education  2018 [employer]    Right ear: For earwax build up use ONE of these over the counter options to soften up with ear wax in your ear: mineral oil drops, docusate (liquid), debrox. Place a 2 drops in your ear while laying on your left side- Keep laying for 30-60 minutes on that side after application. Can use once daily to soften ear wax. Wash in shower afterwards,  . do not stick anything in your ear as it further impact the ear wax

## 2019-01-11 ENCOUNTER — Other Ambulatory Visit: Payer: Self-pay

## 2019-01-11 ENCOUNTER — Encounter: Payer: Self-pay | Admitting: Family Medicine

## 2019-01-11 ENCOUNTER — Ambulatory Visit: Payer: BC Managed Care – PPO | Admitting: Family Medicine

## 2019-01-11 VITALS — BP 122/76 | HR 71 | Temp 98.2°F | Resp 14 | Ht 69.0 in | Wt 227.3 lb

## 2019-01-11 DIAGNOSIS — J029 Acute pharyngitis, unspecified: Secondary | ICD-10-CM | POA: Diagnosis not present

## 2019-01-11 DIAGNOSIS — T3695XA Adverse effect of unspecified systemic antibiotic, initial encounter: Secondary | ICD-10-CM | POA: Diagnosis not present

## 2019-01-11 DIAGNOSIS — B379 Candidiasis, unspecified: Secondary | ICD-10-CM | POA: Diagnosis not present

## 2019-01-11 DIAGNOSIS — K122 Cellulitis and abscess of mouth: Secondary | ICD-10-CM

## 2019-01-11 MED ORDER — AMOXICILLIN-POT CLAVULANATE 875-125 MG PO TABS: 1.0000 | ORAL_TABLET | Freq: Two times a day (BID) | ORAL | 0 refills | Status: AC

## 2019-01-11 MED ORDER — MAGIC MOUTHWASH W/LIDOCAINE: 5.0000 mL | Freq: Four times a day (QID) | ORAL | 0 refills | Status: DC | PRN

## 2019-01-11 MED ORDER — FLUCONAZOLE 150 MG PO TABS: 150.0000 mg | ORAL_TABLET | Freq: Once | ORAL | 0 refills | Status: DC

## 2019-01-11 MED ORDER — PREDNISONE 10 MG PO TABS: 10.0000 mg | ORAL_TABLET | Freq: Every day | ORAL | 0 refills | Status: DC

## 2019-01-11 NOTE — Progress Notes (Signed)
Name: Elizabeth Case   MRN: [id number]    DOB: 1970-11-24   Date:01/11/2019       Progress Note  Subjective  Chief Complaint  Chief Complaint  Patient presents with  . Sore Throat    HPI  Pt presents with 3 week history of sore throat, erythema, and fatigue.  No fevers/chills, cough, chest pain, shortness of breath, NVD, or abdominal pain, no sinus pain/pressure.  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  No recent travel.  Patient Active Problem List   Diagnosis Date Noted  . Elevated red blood cell count 02/09/2017  . Preventative health care 02/09/2017  . Cervical cancer screening 02/08/2017  . First degree hemorrhoids   . Hemorrhage of rectum and anus 09/01/2016  . Constipation 09/01/2016  . NSAID long-term use 09/01/2016  . Allergic conjunctivitis 09/01/2015  . Allergic rhinitis   . Migraines   . Eczema   . Epigastric pain 02/24/2015    Social History   Tobacco Use  . Smoking status: Never Smoker  . Smokeless tobacco: Never Used  Substance Use Topics  . Alcohol use: Yes    Alcohol/week: 0.0 standard drinks    Comment: holidays     Current Outpatient Medications:  .  benzonatate (TESSALON) 200 MG capsule, Take 1 capsule (200 mg total) by mouth 2 (two) times daily as needed for cough., Disp: 20 capsule, Rfl: 0 .  cholecalciferol (VITAMIN D) 1000 UNITS tablet, Take 1,000 Units by mouth daily., Disp: , Rfl:  .  desonide (DESOWEN) 0.05 % lotion, Apply 1 application topically 2 (two) times daily as needed., Disp: , Rfl:  .  levonorgestrel (MIRENA) 20 MCG/24HR IUD, by Intrauterine route once for 1 dose., Disp: 1 each, Rfl: 0 .  polyethylene glycol (MIRALAX / GLYCOLAX) packet, Take 17 g by mouth daily., Disp: 30 each, Rfl: 11 .  promethazine-dextromethorphan (PROMETHAZINE-DM) 6.25-15 MG/5ML syrup, Take 2.5 mLs by mouth at bedtime., Disp: 118 mL, Rfl: 0 .  triamcinolone cream (KENALOG) 0.5 %, Apply 1 application topically 3 (three) times  daily., Disp: 30 g, Rfl: 6  Allergies  Allergen Reactions  . Kiwi Extract Shortness Of Breath  . Latex Dermatitis  . Other Swelling    Walnuts - angioedema    I personally reviewed active problem list, medication list, allergies, lab results with the patient/caregiver today.  ROS  Ten systems reviewed and is negative except as mentioned in HPI.  Objective  Vitals:   01/11/19 1013  BP: 122/76  Pulse: 71  Resp: 14  Temp: 98.2 F (36.8 C)  SpO2: 98%  Weight: 227 lb 4.8 oz (103.1 kg)  Height: 5\' 9"  (1.753 m)    Body mass index is 33.57 kg/m.  Nursing Note and Vital Signs reviewed.  Physical Exam  Constitutional: Patient appears well-developed and well-nourished. No distress.  HENT: Head: Normocephalic and atraumatic. Ears: bilateral TMs with no erythema or effusion; Nose: Nose normal. Mouth/Throat: Oropharynx is clear and moist. No oropharyngeal exudate. OP is erythematous, uvula is injected and mildly edematous without causing airway restriction or issues with swallowing.  Eyes: Conjunctivae and EOM are normal. No scleral icterus. Neck: Normal range of motion. Neck supple. No JVD present. No thyromegaly present.  Cardiovascular: Normal rate, regular rhythm and normal heart sounds.  No murmur heard. No BLE edema. Pulmonary/Chest: Effort normal and breath sounds normal. No respiratory distress. Musculoskeletal: Normal range of motion, no joint effusions. No gross deformities Neurological: Pt is alert and oriented to person, place,  and time. No cranial nerve deficit. Coordination, balance, strength, speech and gait are normal.  Skin: Skin is warm and dry. No rash noted. No erythema.  Psychiatric: Patient has a normal mood and affect. behavior is normal. Judgment and thought content normal.  No results found for this or any previous visit (from the past 72 hour(s)).  Assessment & Plan  1. Pharyngitis, unspecified etiology - amoxicillin-clavulanate (AUGMENTIN) 875-125 MG  tablet; Take 1 tablet by mouth 2 (two) times daily for 10 days.  Dispense: 20 tablet; Refill: 0 - predniSONE (DELTASONE) 10 MG tablet; Take 1 tablet (10 mg total) by mouth daily with breakfast. 5 day taper  Dispense: 15 tablet; Refill: 0 - magic mouthwash w/lidocaine SOLN; Take 5 mLs by mouth 4 (four) times daily as needed for mouth pain.  Dispense: 100 mL; Refill: 0  2. Uvulitis - amoxicillin-clavulanate (AUGMENTIN) 875-125 MG tablet; Take 1 tablet by mouth 2 (two) times daily for 10 days.  Dispense: 20 tablet; Refill: 0 - predniSONE (DELTASONE) 10 MG tablet; Take 1 tablet (10 mg total) by mouth daily with breakfast. 5 day taper  Dispense: 15 tablet; Refill: 0 - magic mouthwash w/lidocaine SOLN; Take 5 mLs by mouth 4 (four) times daily as needed for mouth pain.  Dispense: 100 mL; Refill: 0  *Medications were called in via telephone during EMR Downtime.  -Red flags and when to present for emergency care or RTC including fever >101.21F, chest pain, shortness of breath, new/worsening/un-resolving symptoms, difficulty swallowing or breathing reviewed with patient at time of visit. Follow up and care instructions discussed and provided in AVS.

## 2019-01-11 NOTE — Addendum Note (Signed)
Addended by: Doren Custard on: 01/11/2019 01:32 PM   Modules accepted: Orders

## 2019-01-15 ENCOUNTER — Telehealth: Payer: Self-pay | Admitting: Family Medicine

## 2019-01-15 DIAGNOSIS — B379 Candidiasis, unspecified: Secondary | ICD-10-CM

## 2019-01-15 DIAGNOSIS — T3695XA Adverse effect of unspecified systemic antibiotic, initial encounter: Principal | ICD-10-CM

## 2019-01-15 MED ORDER — FLUCONAZOLE 150 MG PO TABS: 150.0000 mg | ORAL_TABLET | Freq: Once | ORAL | 0 refills | Status: AC

## 2019-01-15 NOTE — Telephone Encounter (Signed)
Copied from CRM [id number]. Topic: Quick Communication - Rx Refill/Question >> Jan 15, 2019  2:23 PM Crist Infante wrote: Medication: diflucan  Pt saw [facility] 3/12 and mentioned needing something for a yeast inf in case the abx gave her one.  Pt states she has developed yeast inf and hopes emily will call in Rx for her [facility] - [place], [place] - [address] [phone] (Phone) [phone] (Fax)

## 2019-01-15 NOTE — Telephone Encounter (Signed)
Fluconazole was sent in on 01/11/2019, should be at the pharmacy.  I have sent in 1 additional pill in case the first is not effective after 72 hours, she can repeat the dose.

## 2019-01-16 NOTE — Telephone Encounter (Signed)
Patient notified

## 2019-02-20 ENCOUNTER — Encounter: Payer: BC Managed Care – PPO | Admitting: Family Medicine

## 2019-04-12 ENCOUNTER — Encounter: Payer: BC Managed Care – PPO | Admitting: Family Medicine

## 2019-04-16 ENCOUNTER — Encounter: Payer: BC Managed Care – PPO | Admitting: Nurse Practitioner

## 2019-04-20 ENCOUNTER — Encounter: Payer: BC Managed Care – PPO | Admitting: Nurse Practitioner

## 2019-05-01 ENCOUNTER — Other Ambulatory Visit: Payer: Self-pay

## 2019-05-01 ENCOUNTER — Ambulatory Visit (INDEPENDENT_AMBULATORY_CARE_PROVIDER_SITE_OTHER): Payer: BC Managed Care – PPO | Admitting: Nurse Practitioner

## 2019-05-01 ENCOUNTER — Encounter: Payer: Self-pay | Admitting: Nurse Practitioner

## 2019-05-01 VITALS — BP 120/74 | HR 89 | Temp 97.5°F | Resp 14 | Ht 68.0 in | Wt 224.7 lb

## 2019-05-01 DIAGNOSIS — Z1239 Encounter for other screening for malignant neoplasm of breast: Secondary | ICD-10-CM

## 2019-05-01 DIAGNOSIS — Z114 Encounter for screening for human immunodeficiency virus [HIV]: Secondary | ICD-10-CM

## 2019-05-01 DIAGNOSIS — Z131 Encounter for screening for diabetes mellitus: Secondary | ICD-10-CM | POA: Diagnosis not present

## 2019-05-01 DIAGNOSIS — Z0000 Encounter for general adult medical examination without abnormal findings: Secondary | ICD-10-CM | POA: Diagnosis not present

## 2019-05-01 DIAGNOSIS — Z23 Encounter for immunization: Secondary | ICD-10-CM

## 2019-05-01 DIAGNOSIS — Z13220 Encounter for screening for lipoid disorders: Secondary | ICD-10-CM

## 2019-05-01 DIAGNOSIS — Z1159 Encounter for screening for other viral diseases: Secondary | ICD-10-CM

## 2019-05-01 NOTE — Patient Instructions (Addendum)
-   Take OTC antihistamine every night to help with postnasal drainage.   Please do call to schedule your mammogram; the number to schedule one at either [facility] or [facility] is [phone]  General recommendations: 150 minutes of physical activity weekly, eat two servings of fish weekly, eat one serving of tree nuts ( cashews, pistachios, pecans, almonds.Marland Kitchen) every other day, eat 6 servings of fruit/vegetables daily and drink plenty of water and avoid sweet beverages. Recommend at least 64 ounces of water daily.    Stay Safe in the [place] The majority of sun exposure occurs before age 63 and skin cancer can take 20 years or more to develop. Whether your sun bathing days are behind you or you still spend time pursuing the perfect tan, you should be concerned about skin cancer.  Remember, the sun's ultraviolet (UV) rays can reflect off water, sand, concrete and snow, and can reach below the water's surface. Certain types of UV light penetrate fog and clouds, so it's possible to get sunburn even on overcast days.  Avoid direct sunlight as much as possible during the peak sun hours, generally 10 a.m. to 3 p.m., or seek shade during this part of day. Wear broad-spectrum sunscreen - with an SPF of at least 30 - containing both UVA and UVB protection. Look for ingredients like [employer] (also known as avobenzone) or titanium dioxide on the label. Reapply sunscreen frequently, at least every two hours when outdoors, especially if you perspire or you've been swimming. Your best bet is to choose water-resistant products that are more likely to stay on your skin. Wear lip balm with an SPF 15 or higher. Wear a hat and other protective clothing while in the sun. Tightly woven fibers and darker clothing generally provide more protection. Also, look for products approved by the American Academy of Dermatology. Wear UV-protective sunglasses.

## 2019-05-01 NOTE — Progress Notes (Signed)
Name: Elizabeth GuarneriKimberly J Zanni   MRN: [id number]    DOB: [birth year]   Date:05/01/2019       Progress Note  Subjective  Chief Complaint  Chief Complaint  Patient presents with  . Annual Exam    HPI  Patient presents for annual CPE.  Diet:   2-3 meals a day Usually 1-2 servings of fruits and 1-2 servings of vegetables a day Eats out more lately.  Notices the more bread she eats her joints get more sore.  Drinks: water- 5-6 bottles of water a day.  Exercise:  2-3 miles a 2-3 times a week.   USPSTF grade A and B recommendations    [facility]  AUDIT-C Score  0     Depression: Phq 9 is  negative [facility] 05/01/2019 01/11/2019 02/17/2018 10/03/2017 02/08/2017  Decreased Interest 0 0 0 0 0  Down, Depressed, Hopeless 0 0 0 0 0  PHQ - 2 Score 0 0 0 0 0  Altered sleeping 0 0 - - -  Tired, decreased energy 0 0 - - -  Change in appetite 0 0 - - -  Feeling bad or failure about yourself  0 0 - - -  Trouble concentrating 0 0 - - -  Moving slowly or fidgety/restless 0 0 - - -  Suicidal thoughts 0 0 - - -  PHQ-9 Score 0 0 - - -  Difficult doing work/chores Not difficult at all Not difficult at all - - -   Hypertension: BP Readings from Last 3 Encounters:  05/01/19 120/74  01/11/19 122/76  09/20/18 120/80   Obesity: Wt Readings from Last 3 Encounters:  05/01/19 224 lb 11.2 oz (101.9 kg)  01/11/19 227 lb 4.8 oz (103.1 kg)  09/20/18 230 lb 4.8 oz (104.5 kg)   BMI Readings from Last 3 Encounters:  05/01/19 34.17 kg/m  01/11/19 33.57 kg/m  09/20/18 35.02 kg/m    Hep C Screening: ordered STD testing and prevention (HIV/chl/gon/syphilis): declies Intimate partner violence: denies  Sexual History/Pain during Intercourse: one episode with mild bleeding last time- discussed lubrication Menstrual History/LMP/Abnormal Bleeding: last period- 2-3 weeks ago has IUD Incontinence Symptoms: stress incontinence   Advanced Care  Planning: A voluntary discussion about advance care planning including the explanation and discussion of advance directives.  Discussed health care proxy and Living will, and the patient was able to identify a health care proxy as husband- Mickel BaasShane Poorman.  Patient does have a living will at present time. If patient does have living will, I have requested they bring this to the clinic to be scanned in to their chart.  Breast cancer: last mammo 2019- fibrocystic breasts no suspicious masses [facility]  Cervical cancer screening: due 2022  Lipids:  Lab Results  Component Value Date   CHOL 168 02/17/2018   CHOL 158 02/08/2017   Lab Results  Component Value Date   HDL 50 (L) 02/17/2018   HDL 42 (L) 02/08/2017   Lab Results  Component Value Date   LDLCALC 102 (H) 02/17/2018   LDLCALC 96 02/08/2017   Lab Results  Component Value Date   TRIG 73 02/17/2018   TRIG 102 02/08/2017   Lab Results  Component Value Date   CHOLHDL 3.4 02/17/2018   CHOLHDL 3.8 02/08/2017   No results found for: LDLDIRECT  Glucose:  Glucose, Bld  Date Value Ref Range Status  02/17/2018 87 65 - 99 mg/dL Final  Comment:    .            Fasting reference interval .   02/08/2017 79 65 - 99 mg/dL Final    Skin cancer: discussed Colorectal cancer: discussed- had in 2017- due 2027 Lung cancer:  Low Dose CT Chest recommended if Age 47-80 years, 30 pack-year currently smoking OR have quit w/in 15years. Patient does not qualify.     Patient Active Problem List   Diagnosis Date Noted  . Elevated red blood cell count 02/09/2017  . Preventative health care 02/09/2017  . Cervical cancer screening 02/08/2017  . First degree hemorrhoids   . Hemorrhage of rectum and anus 09/01/2016  . Constipation 09/01/2016  . NSAID long-term use 09/01/2016  . Allergic conjunctivitis 09/01/2015  . Allergic rhinitis   . Migraines   . Eczema   . Epigastric pain 02/24/2015    Past Surgical History:   Procedure Laterality Date  . BREAST BIOPSY    . BREAST EXCISIONAL BIOPSY Right   . BREAST REDUCTION SURGERY  1994  . BREAST SURGERY Right 2012   lumpectomy  . CHOLECYSTECTOMY  1998  . COLONOSCOPY WITH PROPOFOL N/A 09/08/2016   Procedure: COLONOSCOPY WITH PROPOFOL;  Surgeon: Wyline MoodKiran Anna, MD;  [facility];  Service: Endoscopy;  Laterality: N/A;  Latex sensitivity  . REDUCTION MAMMAPLASTY Bilateral 1994    Family History  Problem Relation Age of Onset  . Hypertension Mother   . Pulmonary fibrosis Mother   . Hypertension Father   . Cancer Paternal Grandmother        breast  . Stroke Paternal Grandfather        cerebral aneurysm caused the stroke  . Aneurysm Paternal Grandfather   . Breast cancer Paternal Grandfather 1550  . Stroke Maternal Grandmother   . Diabetes Neg Hx   . Heart disease Neg Hx     Social History   Socioeconomic History  . Marital status: Married    Spouse name: [name]  . Number of children: 2  . Years of education: 16  . Highest education level: Bachelor's degree (e.g., BA, AB, BS)  Occupational History  . Not on file  Social Needs  . Financial resource strain: Not hard at all  . Food insecurity    Worry: Never true    Inability: Never true  . Transportation needs    Medical: No    Non-medical: No  Tobacco Use  . Smoking status: Never Smoker  . Smokeless tobacco: Never Used  Substance and Sexual Activity  . Alcohol use: Yes    Alcohol/week: 0.0 standard drinks    Comment: holidays  . Drug use: No  . Sexual activity: Yes    Birth control/protection: I.U.D.  Lifestyle  . Physical activity    Days per week: 0 days    Minutes per session: 0 min  . Stress: To some extent  Relationships  . Social connections    Talks on phone: More than three times a week    Gets together: Once a week    Attends religious service: More than 4 times per year    Active member of club or organization: No    Attends meetings of clubs or  organizations: Never    Relationship status: Married  . Intimate partner violence    Fear of current or ex partner: No    Emotionally abused: No    Physically abused: No    Forced sexual activity: No  Other Topics Concern  . Not  on file  Social History Narrative  . Not on file     Current Outpatient Medications:  .  levonorgestrel (MIRENA) 20 MCG/24HR IUD, by Intrauterine route once for 1 dose., Disp: 1 each, Rfl: 0 .  benzonatate (TESSALON) 200 MG capsule, Take 1 capsule (200 mg total) by mouth 2 (two) times daily as needed for cough. (Patient not taking: Reported on 05/01/2019), Disp: 20 capsule, Rfl: 0 .  cholecalciferol (VITAMIN D) 1000 UNITS tablet, Take 1,000 Units by mouth daily., Disp: , Rfl:  .  desonide (DESOWEN) 0.05 % lotion, Apply 1 application topically 2 (two) times daily as needed., Disp: , Rfl:  .  magic mouthwash w/lidocaine SOLN, Take 5 mLs by mouth 4 (four) times daily as needed for mouth pain. (Patient not taking: Reported on 05/01/2019), Disp: 100 mL, Rfl: 0 .  polyethylene glycol (MIRALAX / GLYCOLAX) packet, Take 17 g by mouth daily. (Patient not taking: Reported on 05/01/2019), Disp: 30 each, Rfl: 11 .  predniSONE (DELTASONE) 10 MG tablet, Take 1 tablet (10 mg total) by mouth daily with breakfast. 5 day taper (Patient not taking: Reported on 05/01/2019), Disp: 15 tablet, Rfl: 0 .  promethazine-dextromethorphan (PROMETHAZINE-DM) 6.25-15 MG/5ML syrup, Take 2.5 mLs by mouth at bedtime. (Patient not taking: Reported on 05/01/2019), Disp: 118 mL, Rfl: 0 .  triamcinolone cream (KENALOG) 0.5 %, Apply 1 application topically 3 (three) times daily. (Patient not taking: Reported on 05/01/2019), Disp: 30 g, Rfl: 6  Allergies  Allergen Reactions  . Kiwi Extract Shortness Of Breath  . Latex Dermatitis  . Other Swelling    Walnuts - angioedema     Review of Systems  Constitutional: Negative for chills, fever and malaise/fatigue.  HENT: Negative for congestion, sinus pain and  sore throat.        Had PND   Eyes: Negative for blurred vision.  Respiratory: Negative for cough and shortness of breath.   Cardiovascular: Negative for chest pain, palpitations and leg swelling.  Gastrointestinal: Negative for abdominal pain, blood in stool, constipation, diarrhea and nausea.  Genitourinary: Negative for dysuria and hematuria.  Musculoskeletal: Negative for falls and joint pain.  Skin: Negative for rash.  Neurological: Positive for headaches (worse with mask, resolved with advil). Negative for dizziness and tingling.  Endo/Heme/Allergies: Negative for polydipsia.  Psychiatric/Behavioral: Negative for depression. The patient is nervous/anxious (with world news, controlled). The patient does not have insomnia.      Objective  Vitals:   05/01/19 1318  BP: 120/74  Pulse: 89  Resp: 14  Temp: (!) 97.5 F (36.4 C)  TempSrc: Tympanic  SpO2: 98%  Weight: 224 lb 11.2 oz (101.9 kg)  Height: 5\' 8"  (1.727 m)    Body mass index is 34.17 kg/m.  Physical Exam Constitutional: Patient appears well-developed and well-nourished. No distress.  HENT: Head: Normocephalic and atraumatic. Ears: B TMs ok, no erythema or effusion;  Eyes: Conjunctivae and EOM are normal. Pupils are equal, round, and reactive to light. No scleral icterus.  Neck: Normal range of motion. Neck supple. No JVD present. No thyromegaly present.  Cardiovascular: Normal rate, regular rhythm and normal heart sounds.  No murmur heard. No BLE edema. Pulmonary/Chest: Effort normal and breath sounds normal. No respiratory distress. Abdominal: Soft. Bowel sounds are normal, no distension. There is no tenderness. no masses Breast: bilateral lumpy breasts, no nipple discharge or rashes FEMALE GENITALIA: deferred Musculoskeletal: Normal range of motion, no joint effusions. No gross deformities Neurological: he is alert and oriented to person, place, and time.  No cranial nerve deficit. Coordination, balance,  strength, speech and gait are normal.  Skin: Skin is warm and dry. No rash noted. No erythema.  Psychiatric: Patient has a normal mood and affect. behavior is normal. Judgment and thought content normal.    No results found for this or any previous visit (from the past 2160 hour(s)).    Fall Risk: Fall Risk  05/01/2019 01/11/2019 09/20/2018 02/17/2018 10/03/2017  Falls in the past year? 0 0 0 No No  Number falls in past yr: 0 - - - -  Injury with Fall? 0 - - - -     Functional Status Survey: Is the patient deaf or have difficulty hearing?: No Does the patient have difficulty seeing, even when wearing glasses/contacts?: No Does the patient have difficulty concentrating, remembering, or making decisions?: No Does the patient have difficulty walking or climbing stairs?: No Does the patient have difficulty dressing or bathing?: No Does the patient have difficulty doing errands alone such as visiting a doctor's office or shopping?: No   Assessment & Plan  1. Annual physical exam - Lipid Profile - COMPLETE METABOLIC PANEL WITH GFR - HIV antibody (with reflex) - Hepatitis C Antibody  2. Screening for breast cancer - MM Digital Screening; Future  3. Need for Tdap vaccination - Tdap vaccine greater than or equal to 7yo IM  4. Screening for diabetes mellitus - COMPLETE METABOLIC PANEL WITH GFR  5. Screening cholesterol level - Lipid Profile  6. Encounter for hepatitis C screening test for low risk patient - Hepatitis C Antibody  7. Screening for HIV (human immunodeficiency virus) - HIV antibody (with reflex)   -USPSTF grade A and B recommendations reviewed with patient; age-appropriate recommendations, preventive care, screening tests, etc discussed and encouraged; healthy living encouraged; see AVS for patient education given to patient -Discussed importance of 150 minutes of physical activity weekly, eat two servings of fish weekly, eat one serving of tree nuts ( cashews,  pistachios, pecans, almonds.Marland Kitchen) every other day, eat 6 servings of fruit/vegetables daily and drink plenty of water and avoid sweet beverages.   -Reviewed Health Maintenance: update TDAP

## 2019-05-02 LAB — COMPLETE METABOLIC PANEL WITH GFR
AG Ratio: 1.8 (calc) (ref 1.0–2.5)
ALT: 19 U/L (ref 6–29)
AST: 15 U/L (ref 10–35)
Albumin: 4.3 g/dL (ref 3.6–5.1)
Alkaline phosphatase (APISO): 74 U/L (ref 31–125)
BUN: 8 mg/dL (ref 7–25)
CO2: 26 mmol/L (ref 20–32)
Calcium: 9.6 mg/dL (ref 8.6–10.2)
Chloride: 105 mmol/L (ref 98–110)
Creat: 0.66 mg/dL (ref 0.50–1.10)
GFR, Est African American: 122 mL/min/{1.73_m2} (ref >=60)
GFR, Est Non African American: 105 mL/min/{1.73_m2} (ref >=60)
Globulin: 2.4 g/dL (calc) (ref 1.9–3.7)
Glucose, Bld: 92 mg/dL (ref 65–99)
Potassium: 4.3 mmol/L (ref 3.5–5.3)
Sodium: 139 mmol/L (ref 135–146)
Total Bilirubin: 0.3 mg/dL (ref 0.2–1.2)
Total Protein: 6.7 g/dL (ref 6.1–8.1)

## 2019-05-02 LAB — HIV ANTIBODY (ROUTINE TESTING W REFLEX): HIV 1&2 Ab, 4th Generation: NONREACTIVE

## 2019-05-02 LAB — HEPATITIS C ANTIBODY
Hepatitis C Ab: NONREACTIVE
SIGNAL TO CUT-OFF: 0.01 (ref <1.00)

## 2019-05-02 LAB — LIPID PANEL
Cholesterol: 167 mg/dL (ref <200)
HDL: 46 mg/dL — ABNORMAL LOW (ref >=50)
LDL Cholesterol (Calc): 97 mg/dL (calc)
Non-HDL Cholesterol (Calc): 121 mg/dL (calc) (ref <130)
Total CHOL/HDL Ratio: 3.6 (calc) (ref <5.0)
Triglycerides: 137 mg/dL (ref <150)

## 2019-12-30 ENCOUNTER — Ambulatory Visit: Payer: BC Managed Care – PPO | Attending: Internal Medicine

## 2019-12-30 DIAGNOSIS — Z23 Encounter for immunization: Secondary | ICD-10-CM | POA: Insufficient documentation

## 2019-12-30 NOTE — Progress Notes (Signed)
   Covid-19 Vaccination Clinic  Name:  Elizabeth Case    MRN: [id number] DOB: [birth year]  12/30/2019  [name] was observed post Covid-19 immunization for 15 minutes without incidence. She was provided with Vaccine Information Sheet and instruction to access the V-Safe system.   [name] was instructed to call 911 with any severe reactions post vaccine: Marland Kitchen Difficulty breathing  . Swelling of your face and throat  . A fast heartbeat  . A bad rash all over your body  . Dizziness and weakness    Immunizations Administered    Name Date Dose VIS Date Route   Pfizer COVID-19 Vaccine 12/30/2019  2:20 PM 0.3 mL 10/12/2019 Intramuscular   Manufacturer: [employer], [employer]   Lot: [id number]   NDC: [address]-7

## 2020-01-23 ENCOUNTER — Ambulatory Visit: Payer: BC Managed Care – PPO | Attending: Internal Medicine

## 2020-01-23 DIAGNOSIS — Z23 Encounter for immunization: Secondary | ICD-10-CM

## 2020-01-23 NOTE — Progress Notes (Signed)
   Covid-19 Vaccination Clinic  Name:  Elizabeth Case    MRN: [id number] DOB: 01-31-1971  01/23/2020  Elizabeth Case was observed post Covid-19 immunization for 15 minutes without incident. She was provided with Vaccine Information Sheet and instruction to access the V-Safe system.   Elizabeth Case was instructed to call 911 with any severe reactions post vaccine: Marland Kitchen Difficulty breathing  . Swelling of face and throat  . A fast heartbeat  . A bad rash all over body  . Dizziness and weakness   Immunizations Administered    Name Date Dose VIS Date Route   Pfizer COVID-19 Vaccine 01/23/2020  8:59 AM 0.3 mL 10/12/2019 Intramuscular   Manufacturer: [employer], [employer]   Lot: [id number]   NDC: [address]-6

## 2020-05-01 ENCOUNTER — Encounter: Payer: BC Managed Care – PPO | Admitting: Family Medicine

## 2020-05-08 ENCOUNTER — Encounter: Payer: Self-pay | Admitting: Family Medicine

## 2020-05-08 ENCOUNTER — Other Ambulatory Visit: Payer: Self-pay

## 2020-05-08 ENCOUNTER — Ambulatory Visit (INDEPENDENT_AMBULATORY_CARE_PROVIDER_SITE_OTHER): Payer: BC Managed Care – PPO | Admitting: Family Medicine

## 2020-05-08 VITALS — BP 112/72 | HR 93 | Temp 97.6°F | Resp 16 | Ht 68.0 in | Wt 194.4 lb

## 2020-05-08 DIAGNOSIS — K6289 Other specified diseases of anus and rectum: Secondary | ICD-10-CM

## 2020-05-08 DIAGNOSIS — Z0000 Encounter for general adult medical examination without abnormal findings: Secondary | ICD-10-CM | POA: Diagnosis not present

## 2020-05-08 DIAGNOSIS — Z1231 Encounter for screening mammogram for malignant neoplasm of breast: Secondary | ICD-10-CM | POA: Diagnosis not present

## 2020-05-08 DIAGNOSIS — E785 Hyperlipidemia, unspecified: Secondary | ICD-10-CM | POA: Diagnosis not present

## 2020-05-08 DIAGNOSIS — I499 Cardiac arrhythmia, unspecified: Secondary | ICD-10-CM | POA: Diagnosis not present

## 2020-05-08 DIAGNOSIS — E663 Overweight: Secondary | ICD-10-CM | POA: Diagnosis not present

## 2020-05-08 DIAGNOSIS — I493 Ventricular premature depolarization: Secondary | ICD-10-CM

## 2020-05-08 MED ORDER — POLYETHYLENE GLYCOL 3350 17 GM/SCOOP PO POWD: 17.0000 g | Freq: Every day | ORAL | 0 refills | Status: DC

## 2020-05-08 NOTE — Patient Instructions (Addendum)
[facility] [place],  [place]  [address] [address] Directions Main: [phone]     Use miralax to make stool much softer to loose to avoid trauma Keep using tucks if it helps, and you can use preparation H ointment See info below and please let me know if it does not improve and you need a referral to a specialist  (I have to find out what pharmacies have the topical medicines and then I will send in and give you a call)   Anal Fissure, Adult  An anal fissure is a small tear or crack in the tissue around the opening of the butt (anus). Bleeding from the tear or crack usually stops on its own within a few minutes. The bleeding may happen every time you poop (have a bowel movement) until the tear or crack heals. What are the causes? This condition is usually caused by passing a large or hard poop (stool). Other causes include:  Trouble pooping (constipation).  Passing watery poop (diarrhea).  Inflammatory bowel disease (Crohn's disease or ulcerative colitis).  Childbirth.  Infections.  Anal sex. What are the signs or symptoms? Symptoms of this condition include:  Bleeding from the butt.  Small amounts of blood on your poop. The blood coats the outside of the poop. It is not mixed with the poop.  Small amounts of blood on the toilet paper or in the toilet after you poop.  Pain when passing poop.  Itching or irritation around the opening of the butt. How is this diagnosed? This condition may be diagnosed based on a physical exam. Your doctor may:  Check your butt. A tear can often be seen by checking the area with care.  Check your butt using a short tube (anoscope). The light in the tube will show any problems in your butt. How is this treated? Treatment for this condition may include:  Treating problems that make it hard for you to pass poop. You may be told to: ? Eat more fiber. ? Drink more fluid. ? Take  fiber supplements. ? Take medicines that make poop soft.  Taking sitz baths. This may help to heal the tear.  Using creams and ointments. If your condition gets worse, other treatments may be needed such as:  A shot near the tear or crack (botulinum injection).  Surgery to repair the tear or crack. Follow these instructions at home: Eating and drinking   Avoid bananas and dairy products. These foods can make it hard to poop.  Drink enough fluid to keep your pee (urine) pale yellow.  Eat foods that have a lot of fiber in them, such as: ? Beans. ? Whole grains. ? Fresh fruits. ? Fresh vegetables. General instructions   Take over-the-counter and prescription medicines only as told by your doctor.  Use creams or ointments only as told by your doctor.  Keep the butt area as clean and dry as you can.  Take a warm water bath (sitz bath) as told by your doctor. Do not use soap.  Keep all follow-up visits as told by your doctor. This is important. Contact a doctor if:  You have more bleeding.  You have a fever.  You have watery poop that is mixed with blood.  You have pain.  Your problem gets worse, not better. Summary  An anal fissure is a small tear or crack in the skin around the opening of the butt (anus).  This condition is  usually caused by passing a large or hard poop (stool).  Treatment includes treating the problems that make it hard for you to pass poop.  Follow your doctor's instructions about caring for your condition at home.  Keep all follow-up visits as told by your doctor. This is important. This information is not intended to replace advice given to you by your health care provider. Make sure you discuss any questions you have with your health care provider. Document Revised: 03/30/2018 Document Reviewed: 03/30/2018 Elsevier Patient Education  2020 [employer].   How to Take a [employer] A sitz bath is a warm water bath that may be used to care  for your rectum, genital area, or the area between your rectum and genitals (perineum). For a sitz bath, the water only comes up to your hips and covers your buttocks. A sitz bath may done at home in a bathtub or with a portable sitz bath that fits over the toilet. Your health care provider may recommend a sitz bath to help:  Relieve pain and discomfort after delivering a baby.  Relieve pain and itching from hemorrhoids or anal fissures.  Relieve pain after certain surgeries.  Relax muscles that are sore or tight. How to take a sitz bath Take 3-4 sitz baths a day, or as many as told by your health care provider. Bathtub sitz bath To take a sitz bath in a bathtub: 1. Partially fill a bathtub with warm water. The water should be deep enough to cover your hips and buttocks when you are sitting in the tub. 2. If your health care provider told you to put medicine in the water, follow his or her instructions. 3. Sit in the water. 4. Open the tub drain a little, and leave it open during your bath. 5. Turn on the warm water again, enough to replace the water that is draining out. Keep the water running throughout your bath. This helps keep the water at the right level and the right temperature. 6. Soak in the water for 15-20 minutes, or as long as told by your health care provider. 7. When you are done, be careful when you stand up. You may feel dizzy. 8. After the sitz bath, pat yourself dry. Do not rub your skin to dry it.  Over-the-toilet sitz bath To take a sitz bath with an over-the-toilet basin: 1. Follow the manufacturer's instructions. 2. Fill the basin with warm water. 3. If your health care provider told you to put medicine in the water, follow his or her instructions. 4. Sit on the seat. Make sure the water covers your buttocks and perineum. 5. Soak in the water for 15-20 minutes, or as long as told by your health care provider. 6. After the sitz bath, pat yourself dry. Do not rub  your skin to dry it. 7. Clean and dry the basin between uses. 8. Discard the basin if it cracks, or according to the manufacturer's instructions. Contact a health care provider if:  Your symptoms get worse. Do not continue with sitz baths if your symptoms get worse.  You have new symptoms. If this happens, do not continue with sitz baths until you talk with your health care provider. Summary  A sitz bath is a warm water bath in which the water only comes up to your hips and covers your buttocks.  A sitz bath may help relieve itching, relieve pain, and relax muscles that are sore or tight in the lower part of your body,  including your genital area.  Take 3-4 sitz baths a day, or as many as told by your health care provider. Soak in the water for 15-20 minutes.  Do not continue with sitz baths if your symptoms get worse. This information is not intended to replace advice given to you by your health care provider. Make sure you discuss any questions you have with your health care provider. Document Revised: 03/19/2019 Document Reviewed: 10/20/2017 Elsevier Patient Education  2020 [employer]   Preventive Care 65-44 Years Old, Female Preventive care refers to visits with your health care provider and lifestyle choices that can promote health and wellness. This includes:  A yearly physical exam. This may also be called an annual well check.  Regular dental visits and eye exams.  Immunizations.  Screening for certain conditions.  Healthy lifestyle choices, such as eating a healthy diet, getting regular exercise, not using drugs or products that contain nicotine and tobacco, and limiting alcohol use. What can I expect for my preventive care visit? Physical exam Your health care provider will check your:  Height and weight. This may be used to calculate body mass index (BMI), which tells if you are at a healthy weight.  Heart rate and blood pressure.  Skin for abnormal  spots. Counseling Your health care provider may ask you questions about your:  Alcohol, tobacco, and drug use.  Emotional well-being.  Home and relationship well-being.  Sexual activity.  Eating habits.  Work and work Statistician.  Method of birth control.  Menstrual cycle.  Pregnancy history. What immunizations do I need?  Influenza (flu) vaccine  This is recommended every year. Tetanus, diphtheria, and pertussis (Tdap) vaccine  You may need a Td booster every 10 years. Varicella (chickenpox) vaccine  You may need this if you have not been vaccinated. Zoster (shingles) vaccine  You may need this after age 43. Measles, mumps, and rubella (MMR) vaccine  You may need at least one dose of MMR if you were born in 1957 or later. You may also need a second dose. Pneumococcal conjugate (PCV13) vaccine  You may need this if you have certain conditions and were not previously vaccinated. Pneumococcal polysaccharide (PPSV23) vaccine  You may need one or two doses if you smoke cigarettes or if you have certain conditions. Meningococcal conjugate (MenACWY) vaccine  You may need this if you have certain conditions. Hepatitis A vaccine  You may need this if you have certain conditions or if you travel or work in places where you may be exposed to hepatitis A. Hepatitis B vaccine  You may need this if you have certain conditions or if you travel or work in places where you may be exposed to hepatitis B. Haemophilus influenzae type b (Hib) vaccine  You may need this if you have certain conditions. Human papillomavirus (HPV) vaccine  If recommended by your health care provider, you may need three doses over 6 months. You may receive vaccines as individual doses or as more than one vaccine together in one shot (combination vaccines). Talk with your health care provider about the risks and benefits of combination vaccines. What tests do I need? Blood tests  Lipid and  cholesterol levels. These may be checked every 5 years, or more frequently if you are over 64 years old.  Hepatitis C test.  Hepatitis B test. Screening  Lung cancer screening. You may have this screening every year starting at age 32 if you have a 30-pack-year history of smoking and currently smoke  or have quit within the past 15 years.  Colorectal cancer screening. All adults should have this screening starting at age 79 and continuing until age 50. Your health care provider may recommend screening at age 30 if you are at increased risk. You will have tests every 1-10 years, depending on your results and the type of screening test.  Diabetes screening. This is done by checking your blood sugar (glucose) after you have not eaten for a while (fasting). You may have this done every 1-3 years.  Mammogram. This may be done every 1-2 years. Talk with your health care provider about when you should start having regular mammograms. This may depend on whether you have a family history of breast cancer.  BRCA-related cancer screening. This may be done if you have a family history of breast, ovarian, tubal, or peritoneal cancers.  Pelvic exam and Pap test. This may be done every 3 years starting at age 21. Starting at age 23, this may be done every 5 years if you have a Pap test in combination with an HPV test. Other tests  Sexually transmitted disease (STD) testing.  Bone density scan. This is done to screen for osteoporosis. You may have this scan if you are at high risk for osteoporosis. Follow these instructions at home: Eating and drinking  Eat a diet that includes fresh fruits and vegetables, whole grains, lean protein, and low-fat dairy.  Take vitamin and mineral supplements as recommended by your health care provider.  Do not drink alcohol if: ? Your health care provider tells you not to drink. ? You are pregnant, may be pregnant, or are planning to become pregnant.  If you drink  alcohol: ? Limit how much you have to 0-1 drink a day. ? Be aware of how much alcohol is in your drink. In the [place] one drink equals one 12 oz bottle of beer (355 mL), one 5 oz glass of wine (148 mL), or one 1 oz glass of hard liquor (44 mL). Lifestyle  Take daily care of your teeth and gums.  Stay active. Exercise for at least 30 minutes on 5 or more days each week.  Do not use any products that contain nicotine or tobacco, such as cigarettes, e-cigarettes, and chewing tobacco. If you need help quitting, ask your health care provider.  If you are sexually active, practice safe sex. Use a condom or other form of birth control (contraception) in order to prevent pregnancy and STIs (sexually transmitted infections).  If told by your health care provider, take low-dose aspirin daily starting at age 35. What's next?  Visit your health care provider once a year for a well check visit.  Ask your health care provider how often you should have your eyes and teeth checked.  Stay up to date on all vaccines. This information is not intended to replace advice given to you by your health care provider. Make sure you discuss any questions you have with your health care provider. Document Revised: 06/29/2018 Document Reviewed: 06/29/2018 Elsevier Patient Education  [place].   Premature Ventricular Contraction  A premature ventricular contraction (PVC) is a common kind of irregular heartbeat (arrhythmia). These contractions are extra heartbeats that start in the ventricles of the heart and occur too early in the normal sequence. During the PVC, the heart's normal electrical pathway is not used, so the beat is shorter and less effective. In most cases, these contractions come and go and do not require treatment. What are  the causes? Common causes of the condition include:  Smoking.  Drinking alcohol.  Certain medicines.  Some illegal drugs.  Stress.  Caffeine. Certain medical  conditions can also cause PVCs:  Heart failure.  Heart attack, or coronary artery disease.  Heart valve problems.  Changes in minerals in the blood (electrolytes).  Low blood oxygen levels or high carbon dioxide levels. In many cases, the cause of this condition is not known. What are the signs or symptoms? The main symptom of this condition is fast or skipped heartbeats (palpitations). Other symptoms include:  Chest pain.  Shortness of breath.  Feeling tired.  Dizziness.  Difficulty exercising. In some cases, there are no symptoms. How is this diagnosed? This condition may be diagnosed based on:  Your medical history.  A physical exam. During the exam, the health care provider will check for irregular heartbeats.  Tests, such as: ? An ECG (electrocardiogram) to monitor the electrical activity of your heart. ? An ambulatory cardiac monitor. This device records your heartbeats for 24 hours or more. ? Stress tests to see how exercise affects your heart rhythm and blood supply. ? An echocardiogram. This test uses sound waves (ultrasound) to produce an image of your heart. ? An electrophysiology study (EPS). This test checks for electrical problems in your heart. How is this treated? Treatment for this condition depends on any underlying conditions, the type of PVCs that you are having, and how much the symptoms are interfering with your daily life. Possible treatments include:  Avoiding things that cause premature contractions (triggers). These include caffeine and alcohol.  Taking medicines if symptoms are severe or if the extra heartbeats are frequent.  Getting treatment for underlying conditions that cause PVCs.  Having an implantable cardioverter defibrillator (ICD), if you are at risk for a serious arrhythmia. The ICD is a small device that is inserted into your chest to monitor your heartbeat. When it senses an irregular heartbeat, it sends a shock to bring the  heartbeat back to normal.  Having a procedure to destroy the portion of the heart tissue that sends out abnormal signals (catheter ablation). In some cases, no treatment is required. Follow these instructions at home: Lifestyle  Do not use any products that contain nicotine or tobacco, such as cigarettes, e-cigarettes, and chewing tobacco. If you need help quitting, ask your health care provider.  Do not use illegal drugs.  Exercise regularly. Ask your health care provider what type of exercise is safe for you.  Try to get at least 7-9 hours of sleep each night, or as much as recommended by your health care provider.  Find healthy ways to manage stress. Avoid stressful situations when possible. Alcohol use  Do not drink alcohol if: ? Your health care provider tells you not to drink. ? You are pregnant, may be pregnant, or are planning to become pregnant. ? Alcohol triggers your episodes.  If you drink alcohol: ? Limit how much you use to:  0-1 drink a day for women.  0-2 drinks a day for men.  Be aware of how much alcohol is in your drink. In the [place] one drink equals one 12 oz bottle of beer (355 mL), one 5 oz glass of wine (148 mL), or one 1 oz glass of hard liquor (44 mL). General instructions  Take over-the-counter and prescription medicines only as told by your health care provider.  If caffeine triggers episodes of PVC, do not eat, drink, or use anything with caffeine in it.  Keep all follow-up visits as told by your health care provider. This is important. Contact a health care provider if you:  Feel palpitations. Get help right away if you:  Have chest pain.  Have shortness of breath.  Have sweating for no reason.  Have nausea and vomiting.  Become light-headed or you faint. Summary  A premature ventricular contraction (PVC) is a common kind of irregular heartbeat (arrhythmia).  In most cases, these contractions come and go and do not require  treatment.  You may need to wear an ambulatory cardiac monitor. This records your heartbeats for 24 hours or more.  Treatment depends on any underlying conditions, the type of PVCs that you are having, and how much the symptoms are interfering with your daily life. This information is not intended to replace advice given to you by your health care provider. Make sure you discuss any questions you have with your health care provider. Document Revised: 07/13/2018 Document Reviewed: 07/13/2018 Elsevier Patient Education  2020 [employer].

## 2020-05-08 NOTE — Progress Notes (Signed)
Patient: Elizabeth Case, Female    DOB: 1971/02/28, 49 y.o.   MRN: [id number] Elizabeth Courser, MD Visit Date: 05/08/2020  Today's Provider: Delsa Grana, PA-C   Chief Complaint  Patient presents with  . Annual Exam    no pap   Subjective:   Annual physical exam:  Elizabeth Case is a 49 y.o. female who presents today for complete physical exam:  Exercise/Activity:  3 miles walking every day   Diet/nutrition:  noom  Sleep:  Sleeps "pretty good"  7-8 hours  Pt wished to discuss acute complaints in addition to CPE. Advised pt of separate visit billing/coding   Concerns of hemorrhoids and rectal pain - tearing and some scant blood.  No constipation, though she had previously with hemorrhoids, she is not having normal daily BMs that are not hard, no straining, but severe ripping, stabbing tearing pain.  No improvement with OTC retal creams and wipes.     [name] lost 30 lbs with Noom program  USPSTF grade A and B recommendations - reviewed and addressed today  Depression:  Phq 9 completed today by patient, was reviewed by me with patient in the room [name] is neg, pt feels good PHQ 2/9 Scores 05/08/2020 05/01/2019 01/11/2019 02/17/2018  PHQ - 2 Score 0 0 0 0  PHQ- 9 Score 0 0 0 -   Depression screen [facility] 2/9 05/08/2020 05/01/2019 01/11/2019 02/17/2018 10/03/2017  Decreased Interest 0 0 0 0 0  Down, Depressed, Hopeless 0 0 0 0 0  PHQ - 2 Score 0 0 0 0 0  Altered sleeping 0 0 0 - -  Tired, decreased energy 0 0 0 - -  Change in appetite 0 0 0 - -  Feeling bad or failure about yourself  0 0 0 - -  Trouble concentrating 0 0 0 - -  Moving slowly or fidgety/restless 0 0 0 - -  Suicidal thoughts 0 0 0 - -  PHQ-9 Score 0 0 0 - -  Difficult doing work/chores Not difficult at all Not difficult at all Not difficult at all - -    Alcohol screening:   Office Visit from 05/08/2020 in [facility]  AUDIT-C Score 1      Immunizations and Health Maintenance: Health  Maintenance  Topic Date Due  . MAMMOGRAM  03/01/2019  . INFLUENZA VACCINE  06/01/2020  . PAP SMEAR-Modifier  01/31/2021  . TETANUS/TDAP  04/30/2029  . COVID-19 Vaccine  Completed  . Hepatitis C Screening  Completed  . HIV Screening  Completed     Hep C Screening: done  STD testing and prevention (HIV/chl/gon/syphilis):  see above, no additional testing desired by pt today HIV or STDs  -  2019   Intimate partner violence:   denies  Sexual History/Pain during Intercourse: Married, no probs, denies discomfort  Menstrual History/LMP/Abnormal Bleeding: IUD - had breaks wth past IUDs, since last IUD replacement she has reg cycles - [facility] - [name] Patient's last menstrual period was 04/20/2020.  Incontinence Symptoms: none   Breast cancer:  Due - breast exam today Last Mammogram: *see HM list above BRCA gene screening: none   Cervical cancer screening: UTD, not due until next year Pt denies family hx of cancers - breast, ovarian, uterine, colon:     Osteoporosis:   Discussion on osteoporosis per age, including high calcium and vitamin D supplementation, weight bearing exercises  Skin cancer:  Hx of skin CA -  NO  Discussed atypical lesions   Colorectal cancer:   Colonoscopy is UTD Discussed concerning signs and sx of CRC, pt denies melena, hematochezia, no change in BM of caliber  Rectal pain, hemorrhoids and suspected fissure   Lung cancer:   Low Dose CT Chest recommended if Age 53-80 years, 30 pack-year currently smoking OR have quit w/in 15years. Patient does not qualify.    Social History   Tobacco Use  . Smoking status: Never Smoker  . Smokeless tobacco: Never Used  Vaping Use  . Vaping Use: Never used  Substance Use Topics  . Alcohol use: Yes    Alcohol/week: 0.0 standard drinks    Comment: holidays  . Drug use: No       Office Visit from 05/08/2020 in [facility]  AUDIT-C Score 1      Family History  Problem  Relation Age of Onset  . Hypertension Mother   . Pulmonary fibrosis Mother   . Hypertension Father   . Cancer Paternal Grandmother        breast  . Stroke Paternal Grandfather        cerebral aneurysm caused the stroke  . Aneurysm Paternal Grandfather   . Breast cancer Paternal Grandfather 43  . Stroke Maternal Grandmother   . Diabetes Neg Hx   . Heart disease Neg Hx      Blood pressure/Hypertension: BP Readings from Last 3 Encounters:  05/08/20 112/72  05/01/19 120/74  01/11/19 122/76    Weight/Obesity: Wt Readings from Last 3 Encounters:  05/08/20 194 lb 6.4 oz (88.2 kg)  05/01/19 224 lb 11.2 oz (101.9 kg)  01/11/19 227 lb 4.8 oz (103.1 kg)   BMI Readings from Last 3 Encounters:  05/08/20 29.56 kg/m  05/01/19 34.17 kg/m  01/11/19 33.57 kg/m     Lipids:  Lab Results  Component Value Date   CHOL 167 05/01/2019   CHOL 168 02/17/2018   CHOL 158 02/08/2017   Lab Results  Component Value Date   HDL 46 (L) 05/01/2019   HDL 50 (L) 02/17/2018   HDL 42 (L) 02/08/2017   Lab Results  Component Value Date   LDLCALC 97 05/01/2019   LDLCALC 102 (H) 02/17/2018   LDLCALC 96 02/08/2017   Lab Results  Component Value Date   TRIG 137 05/01/2019   TRIG 73 02/17/2018   TRIG 102 02/08/2017   Lab Results  Component Value Date   CHOLHDL 3.6 05/01/2019   CHOLHDL 3.4 02/17/2018   CHOLHDL 3.8 02/08/2017   No results found for: LDLDIRECT Based on the results of lipid panel his/her cardiovascular risk factor ( using [place] )  in the next 10 years is: The 10-year ASCVD risk score Mikey Bussing DC Brooke Bonito., et al., 2013) is: 0.8%   Values used to calculate the score:     Age: 63 years     Sex: Female     Is Non-Hispanic African American: No     Diabetic: No     Tobacco smoker: No     Systolic Blood Pressure: 563 mmHg     Is BP treated: No     HDL Cholesterol: 46 mg/dL     Total Cholesterol: 167 mg/dL Glucose:  Glucose, Bld  Date Value Ref Range Status  05/01/2019 92 65  - 99 mg/dL Final    Comment:    .            Fasting reference interval .   02/17/2018 87 65 - 99 mg/dL Final  Comment:    .            Fasting reference interval .   02/08/2017 79 65 - 99 mg/dL Final   Hypertension: BP Readings from Last 3 Encounters:  05/08/20 112/72  05/01/19 120/74  01/11/19 122/76   Obesity: Wt Readings from Last 3 Encounters:  05/08/20 194 lb 6.4 oz (88.2 kg)  05/01/19 224 lb 11.2 oz (101.9 kg)  01/11/19 227 lb 4.8 oz (103.1 kg)   BMI Readings from Last 3 Encounters:  05/08/20 29.56 kg/m  05/01/19 34.17 kg/m  01/11/19 33.57 kg/m      Advanced Care Planning:  A voluntary discussion about advance care planning including the explanation and discussion of advance directives.   Discussed health care proxy and Living will, and the patient was able to identify a health care proxy as husband.   Patient does have a living will at present time.  Asked her to bring in for [place] to copy and scan to chart  Social History      She        Social History   Socioeconomic History  . Marital status: Married    Spouse name: Audelia Acton  . Number of children: 2  . Years of education: 16  . Highest education level: Bachelor's degree (e.g., BA, AB, BS)  Occupational History  . Not on file  Tobacco Use  . Smoking status: Never Smoker  . Smokeless tobacco: Never Used  Vaping Use  . Vaping Use: Never used  Substance and Sexual Activity  . Alcohol use: Yes    Alcohol/week: 0.0 standard drinks    Comment: holidays  . Drug use: No  . Sexual activity: Yes    Birth control/protection: I.U.D.  Other Topics Concern  . Not on file  Social History Narrative  . Not on file   Social Determinants of Health   Financial Resource Strain:   . Difficulty of Paying Living Expenses:   Food Insecurity:   . Worried About Charity fundraiser in the Last Year:   . Arboriculturist in the Last Year:   Transportation Needs:   . Film/video editor (Medical):   Marland Kitchen Lack of  Transportation (Non-Medical):   Physical Activity:   . Days of Exercise per Week:   . Minutes of Exercise per Session:   Stress:   . Feeling of Stress :   Social Connections:   . Frequency of Communication with Friends and Family:   . Frequency of Social Gatherings with Friends and Family:   . Attends Religious Services:   . Active Member of Clubs or Organizations:   . Attends Archivist Meetings:   Marland Kitchen Marital Status:     Family History        Family History  Problem Relation Age of Onset  . Hypertension Mother   . Pulmonary fibrosis Mother   . Hypertension Father   . Cancer Paternal Grandmother        breast  . Stroke Paternal Grandfather        cerebral aneurysm caused the stroke  . Aneurysm Paternal Grandfather   . Breast cancer Paternal Grandfather 62  . Stroke Maternal Grandmother   . Diabetes Neg Hx   . Heart disease Neg Hx     Patient Active Problem List   Diagnosis Date Noted  . First degree hemorrhoids   . Allergic rhinitis   . Migraines   . Eczema     Past Surgical History:  Procedure Laterality Date  . BREAST BIOPSY    . BREAST EXCISIONAL BIOPSY Right   . BREAST REDUCTION SURGERY  1994  . BREAST SURGERY Right 2012   lumpectomy  . CHOLECYSTECTOMY  1998  . COLONOSCOPY WITH PROPOFOL N/A 09/08/2016   Procedure: COLONOSCOPY WITH PROPOFOL;  Surgeon: Jonathon Bellows, MD;  Location: [place];  Service: Endoscopy;  Laterality: N/A;  Latex sensitivity  . REDUCTION MAMMAPLASTY Bilateral 1994     Current Outpatient Medications:  .  cholecalciferol (VITAMIN D) 1000 UNITS tablet, Take 1,000 Units by mouth daily., Disp: , Rfl:  .  triamcinolone (KENALOG) 0.025 % cream, Apply 1 application topically 2 (two) times daily., Disp: , Rfl:  .  desonide (DESOWEN) 0.05 % lotion, Apply 1 application topically 2 (two) times daily as needed. (Patient not taking: Reported on 05/08/2020), Disp: , Rfl:  .  levonorgestrel (MIRENA) 20 MCG/24HR IUD, by Intrauterine  route once for 1 dose., Disp: 1 each, Rfl: 0  Allergies  Allergen Reactions  . Kiwi Extract Shortness Of Breath  . Latex Dermatitis  . Other Swelling    Walnuts - angioedema    Patient Care Team: Elizabeth Courser, MD as PCP - General (Family Medicine) Bary Castilla Forest Gleason, MD (General Surgery)  Review of Systems  Constitutional: Negative.  Negative for activity change, appetite change, chills, diaphoresis, fatigue, fever and unexpected weight change.  HENT: Negative.   Eyes: Negative.  Negative for visual disturbance.  Respiratory: Negative.  Negative for chest tightness and shortness of breath.   Cardiovascular: Negative.  Negative for chest pain, palpitations and leg swelling.  Gastrointestinal: Positive for rectal pain. Negative for abdominal distention, abdominal pain, blood in stool, constipation and diarrhea.  Endocrine: Negative.   Genitourinary: Negative.   Musculoskeletal: Negative.  Negative for arthralgias, gait problem, joint swelling and myalgias.  Skin: Negative.  Negative for color change, pallor and rash.  Allergic/Immunologic: Negative.   Neurological: Negative.  Negative for dizziness, syncope, weakness, light-headedness and headaches.  Hematological: Negative.   Psychiatric/Behavioral: Negative.   All other systems reviewed and are negative.    I personally reviewed active problem list, medication list, allergies, family history, social history, health maintenance, notes from last encounter, lab results, imaging with the patient/caregiver today.        Objective:   Vitals:  Vitals:   05/08/20 1427  BP: 112/72  Pulse: 93  Resp: 16  Temp: 97.6 F (36.4 C)  TempSrc: Temporal  SpO2: 98%  Weight: 194 lb 6.4 oz (88.2 kg)  Height: _0  (1.727 m)    Body mass index is 29.56 kg/m.  Physical Exam Vitals and nursing note reviewed.  Constitutional:      General: She is not in acute distress.    Appearance: Normal appearance. She is well-developed. She  is not toxic-appearing or diaphoretic.  HENT:     Head: Normocephalic and atraumatic.     Right Ear: External ear normal.     Left Ear: External ear normal.     Nose: Nose normal.     Mouth/Throat:     Pharynx: Uvula midline.  Eyes:     General: Lids are normal.     Conjunctiva/sclera: Conjunctivae normal.     Pupils: Pupils are equal, round, and reactive to light.  Neck:     Thyroid: No thyroid mass, thyromegaly or thyroid tenderness.     Trachea: Phonation normal. No tracheal deviation.  Cardiovascular:     Rate and Rhythm: Normal rate and regular rhythm. Frequent  extrasystoles are present.    Pulses: Normal pulses.          Radial pulses are 2+ on the right side and 2+ on the left side.       Posterior tibial pulses are 2+ on the right side and 2+ on the left side.     Heart sounds: No murmur heard.  No friction rub. No gallop.   Pulmonary:     Effort: Pulmonary effort is normal. No respiratory distress.     Breath sounds: Normal breath sounds. No stridor. No wheezing, rhonchi or rales.  Chest:     Chest wall: No tenderness.     Breasts:        Right: Normal. No swelling, bleeding, inverted nipple, mass, nipple discharge, skin change or tenderness.        Left: Normal. No swelling, bleeding, inverted nipple, mass, nipple discharge, skin change or tenderness.     Comments: Normal appearing surgical scars bilaterally Abdominal:     General: Bowel sounds are normal. There is no distension.     Palpations: Abdomen is soft.     Tenderness: There is no abdominal tenderness. There is no guarding or rebound.  Genitourinary:    Rectum: Tenderness and anal fissure present.     Comments: Skin tags, suspected anal fissure at 12 o'clock Musculoskeletal:        General: No deformity. Normal range of motion.     Cervical back: Normal range of motion and neck supple.     Right lower leg: No edema.     Left lower leg: No edema.  Lymphadenopathy:     Cervical: No cervical adenopathy.      Upper Body:     Right upper body: No supraclavicular, axillary or pectoral adenopathy.     Left upper body: No supraclavicular, axillary or pectoral adenopathy.  Skin:    General: Skin is warm and dry.     Capillary Refill: Capillary refill takes less than 2 seconds.     Coloration: Skin is not pale.     Findings: No rash.  Neurological:     Mental Status: She is alert and oriented to person, place, and time.     Motor: No abnormal muscle tone.     Gait: Gait normal.  Psychiatric:        Speech: Speech normal.        Behavior: Behavior normal.    ECG interpretation   Date: 05/08/20  Rate: 64  Rhythm: sinus arrhythmia with frequent PVCs  QRS Axis: normal  Intervals: normal  ST/T Wave abnormalities: normal on rhythm strip - difficult to assess on V1-V3 due to PVCs  Conduction Disutrbances: none  Narrative Interpretation: "abnormal ECG"  Old EKG Reviewed: no past ECG to compare to       Fall Risk: Fall Risk  05/08/2020 05/01/2019 01/11/2019 09/20/2018 02/17/2018  Falls in the past year? 0 0 0 0 No  Number falls in past yr: 0 0 - - -  Injury with Fall? 0 0 - - -  Follow up Falls evaluation completed - - - -    Functional Status Survey: Is the patient deaf or have difficulty hearing?: No Does the patient have difficulty seeing, even when wearing glasses/contacts?: No Does the patient have difficulty concentrating, remembering, or making decisions?: No Does the patient have difficulty walking or climbing stairs?: No Does the patient have difficulty dressing or bathing?: No Does the patient have difficulty doing errands alone such as visiting a  doctor's office or shopping?: No   Assessment & Plan:    CPE completed today  . USPSTF grade A and B recommendations reviewed with patient; age-appropriate recommendations, preventive care, screening tests, etc discussed and encouraged; healthy living encouraged; see AVS for patient education given to patient  . Discussed  importance of 150 minutes of physical activity weekly, AHA exercise recommendations given to pt in AVS/handout  . Discussed importance of healthy diet:  eating lean meats and proteins, avoiding trans fats and saturated fats, avoid simple sugars and excessive carbs in diet, eat 6 servings of fruit/vegetables daily and drink plenty of water and avoid sweet beverages.    . Recommended pt to do annual eye exam and routine dental exams/cleanings  . Depression, alcohol, fall screening completed as documented above and per flowsheets  . Reviewed Health Maintenance: Health Maintenance  Topic Date Due  . MAMMOGRAM  03/01/2019  . INFLUENZA VACCINE  06/01/2020  . PAP SMEAR-Modifier  01/31/2021  . TETANUS/TDAP  04/30/2029  . COVID-19 Vaccine  Completed  . Hepatitis C Screening  Completed  . HIV Screening  Completed    . Immunizations: Immunization History  Administered Date(s) Administered  . Influenza,inj,Quad PF,6+ Mos 10/03/2017, 09/20/2018  . PFIZER SARS-COV-2 Vaccination 12/30/2019, 01/23/2020  . Td 02/25/2009  . Tdap 05/01/2019     ICD-10-CM   1. Annual physical exam  Z00.00 Lipid panel    CBC w/Diff/Platelet    COMPLETE METABOLIC PANEL WITH GFR  2. Encounter for screening mammogram for malignant neoplasm of breast  Z12.31 MM 3D SCREEN BREAST BILATERAL  3. Overweight  E66.3 Lipid panel    CBC w/Diff/Platelet    COMPLETE METABOLIC PANEL WITH GFR   doing great with weightloss with Noom program, lost 30 lbs and working out - continue   4. Dyslipidemia  E78.5 Lipid panel    COMPLETE METABOLIC PANEL WITH GFR   Prior high LDL and consistently low HDL, with significant weight loss and improved physical activity, recheck labs  5. Rectal pain  K62.89    skin tags, hx of hemorrhoids and suspected anal fissure - tx fiber, fluids, stool softeners and topical ointment/meds to sooth/protect, f/up as needed  Would like to find a compounding pharmacy for topical analgesic and nifedipine 0.2 to  0.3% nifedipine ointment two to four times daily - 2% lidocaine jelly  Reviewed other care with her and handouts were given F/up 4 weeks     6. 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  Pt asx, no palpitations, she is exercising daily w/o any exertional sx.   EKG captured 4 PVCs, they did seem to occur at least every 4-6 beats. Due to frequency for age, suggested cardiac consult.   - EKG 12-Lead - TSH  - Ambulatory referral to Cardiology        Elizabeth Grana, PA-C 05/08/20 2:45 PM  [facility]

## 2020-05-09 LAB — COMPLETE METABOLIC PANEL WITH GFR
AG Ratio: 1.8 (calc) (ref 1.0–2.5)
ALT: 11 U/L (ref 6–29)
AST: 13 U/L (ref 10–35)
Albumin: 4.3 g/dL (ref 3.6–5.1)
Alkaline phosphatase (APISO): 65 U/L (ref 31–125)
BUN: 8 mg/dL (ref 7–25)
CO2: 26 mmol/L (ref 20–32)
Calcium: 9.3 mg/dL (ref 8.6–10.2)
Chloride: 105 mmol/L (ref 98–110)
Creat: 0.63 mg/dL (ref 0.50–1.10)
GFR, Est African American: 123 mL/min/{1.73_m2} (ref >=60)
GFR, Est Non African American: 106 mL/min/{1.73_m2} (ref >=60)
Globulin: 2.4 g/dL (calc) (ref 1.9–3.7)
Glucose, Bld: 84 mg/dL (ref 65–99)
Potassium: 3.9 mmol/L (ref 3.5–5.3)
Sodium: 138 mmol/L (ref 135–146)
Total Bilirubin: 0.5 mg/dL (ref 0.2–1.2)
Total Protein: 6.7 g/dL (ref 6.1–8.1)

## 2020-05-09 LAB — CBC WITH DIFFERENTIAL/PLATELET
Absolute Monocytes: 607 cells/uL (ref 200–950)
Basophils Absolute: 59 cells/uL (ref 0–200)
Basophils Relative: 0.8 %
Eosinophils Absolute: 266 cells/uL (ref 15–500)
Eosinophils Relative: 3.6 %
HCT: 45.2 % — ABNORMAL HIGH (ref 35.0–45.0)
Hemoglobin: 15 g/dL (ref 11.7–15.5)
Lymphs Abs: 1746 cells/uL (ref 850–3900)
MCH: 30.2 pg (ref 27.0–33.0)
MCHC: 33.2 g/dL (ref 32.0–36.0)
MCV: 91.1 fL (ref 80.0–100.0)
MPV: 11.7 fL (ref 7.5–12.5)
Monocytes Relative: 8.2 %
Neutro Abs: 4721 cells/uL (ref 1500–7800)
Neutrophils Relative %: 63.8 %
Platelets: 245 10*3/uL (ref 140–400)
RBC: 4.96 10*6/uL (ref 3.80–5.10)
RDW: 12.5 % (ref 11.0–15.0)
Total Lymphocyte: 23.6 %
WBC: 7.4 10*3/uL (ref 3.8–10.8)

## 2020-05-09 LAB — LIPID PANEL
Cholesterol: 144 mg/dL (ref <200)
HDL: 42 mg/dL — ABNORMAL LOW (ref >=50)
LDL Cholesterol (Calc): 86 mg/dL (calc)
Non-HDL Cholesterol (Calc): 102 mg/dL (calc) (ref <130)
Total CHOL/HDL Ratio: 3.4 (calc) (ref <5.0)
Triglycerides: 70 mg/dL (ref <150)

## 2020-05-09 LAB — TEST AUTHORIZATION

## 2020-05-09 LAB — TSH: TSH: 2.02 mIU/L

## 2020-06-02 ENCOUNTER — Ambulatory Visit: Payer: BC Managed Care – PPO | Admitting: Cardiology

## 2020-06-02 ENCOUNTER — Encounter: Payer: Self-pay | Admitting: Cardiology

## 2020-06-02 ENCOUNTER — Other Ambulatory Visit (HOSPITAL_COMMUNITY): Payer: Self-pay

## 2020-06-02 ENCOUNTER — Other Ambulatory Visit: Payer: Self-pay

## 2020-06-02 ENCOUNTER — Ambulatory Visit (INDEPENDENT_AMBULATORY_CARE_PROVIDER_SITE_OTHER): Payer: BC Managed Care – PPO

## 2020-06-02 VITALS — BP 122/80 | HR 80 | Ht 68.0 in | Wt 190.0 lb

## 2020-06-02 DIAGNOSIS — I499 Cardiac arrhythmia, unspecified: Secondary | ICD-10-CM

## 2020-06-02 NOTE — Progress Notes (Signed)
Cardiology Office Note:    Date:  06/02/2020   ID:  Elizabeth Case, DOB 02-May-1971, MRN [id number]  PCP:  Danelle Berry, PA-C  [facility] Cardiologist:  Debbe Odea, MD  [facility] Electrophysiologist:  None   Referring MD: Danelle Berry, PA-C   Chief Complaint  Patient presents with  . New Patient (Initial Visit)    Referred by PCP for irregular heart rhythm and PVCs. Meds reviewed verbally with patient.    Elizabeth Case is a 49 y.o. female who is being seen today for the evaluation of irregular heartbeats at the request of Danelle Berry, PA-C.   History of Present Illness:    Elizabeth Case is a 49 y.o. female with a hx of GERD who presents due to irregular heartbeat.  Patient states having symptoms of heart fluttering/irregular heartbeats over the past month.  Symptoms alcohol almost daily lasting a couple of seconds.  She denies chest pain, shortness of breath at rest or with exertion.  Denies any symptoms of dizziness or syncope.  Denies drinking caffeinated drinks.  Denies any history of heart disease, able to perform all her activities of daily living without any restrictions.  Patient saw her primary care provider on 05/30/2020 for very little physical exam.  Skipped heartbeats were noted on physical exam.  EKG performed revealed frequent PVCs.   Past Medical History:  Diagnosis Date  . Allergic rhinitis   . Eczema   . GERD (gastroesophageal reflux disease)   . Migraines    1x/mo  . Vitamin D deficiency disease     Past Surgical History:  Procedure Laterality Date  . BREAST BIOPSY    . BREAST EXCISIONAL BIOPSY Right   . BREAST REDUCTION SURGERY  1994  . BREAST SURGERY Right 2012   lumpectomy  . CHOLECYSTECTOMY  1998  . COLONOSCOPY WITH PROPOFOL N/A 09/08/2016   Procedure: COLONOSCOPY WITH PROPOFOL;  Surgeon: Wyline Mood, MD;  Location: [facility];  Service: Endoscopy;  Laterality: N/A;  Latex sensitivity  . REDUCTION MAMMAPLASTY  Bilateral 1994    Current Medications: Current Meds  Medication Sig  . levonorgestrel (MIRENA) 20 MCG/24HR IUD by Intrauterine route once for 1 dose.  . triamcinolone (KENALOG) 0.025 % cream Apply 1 application topically 2 (two) times daily.     Allergies:   Kiwi extract, Latex, and Other   Social History   Socioeconomic History  . Marital status: Married    Spouse name: Vincenza Hews  . Number of children: 2  . Years of education: 16  . Highest education level: Bachelor's degree (e.g., BA, AB, BS)  Occupational History  . Not on file  Tobacco Use  . Smoking status: Never Smoker  . Smokeless tobacco: Never Used  Vaping Use  . Vaping Use: Never used  Substance and Sexual Activity  . Alcohol use: Yes    Alcohol/week: 0.0 standard drinks    Comment: holidays  . Drug use: No  . Sexual activity: Yes    Birth control/protection: I.U.D.  Other Topics Concern  . Not on file  Social History Narrative  . Not on file   Social Determinants of Health   Financial Resource Strain:   . Difficulty of Paying Living Expenses:   Food Insecurity:   . Worried About Programme researcher, broadcasting/film/video in the Last Year:   . Barista in the Last Year:   Transportation Needs:   . Freight forwarder (Medical):   Marland Kitchen Lack of Transportation (Non-Medical):  Physical Activity:   . Days of Exercise per Week:   . Minutes of Exercise per Session:   Stress:   . Feeling of Stress :   Social Connections:   . Frequency of Communication with Friends and Family:   . Frequency of Social Gatherings with Friends and Family:   . Attends Religious Services:   . Active Member of Clubs or Organizations:   . Attends Banker Meetings:   Marland Kitchen Marital Status:      Family History: The patient's family history includes Aneurysm in her paternal grandfather; Breast cancer in her paternal grandmother; Cancer in her paternal grandmother; Cancer (age of onset: 60) in her father; Hypertension in her father and  mother; Pulmonary fibrosis in her mother; Stroke in her maternal grandmother and paternal grandfather. There is no history of Diabetes or Heart disease.  ROS:   Please see the history of present illness.     All other systems reviewed and are negative.  EKGs/Labs/Other Studies Reviewed:    The following studies were reviewed today:   EKG:  EKG is  ordered today.  The ekg ordered today demonstrates sinus rhythm, occasional PVC.  Recent Labs: 05/08/2020: ALT 11; BUN 8; Creat 0.63; Hemoglobin 15.0; Platelets 245; Potassium 3.9; Sodium 138; TSH 2.02  Recent Lipid Panel    Component Value Date/Time   CHOL 144 05/08/2020 1533   TRIG 70 05/08/2020 1533   HDL 42 (L) 05/08/2020 1533   CHOLHDL 3.4 05/08/2020 1533   VLDL 20 02/08/2017 1418   LDLCALC 86 05/08/2020 1533    Physical Exam:    VS:  BP 122/80 (BP Location: Left Arm, Patient Position: Sitting, Cuff Size: Normal)   Pulse 80   Ht 5\' 8"  (1.727 m)   Wt 190 lb (86.2 kg)   SpO2 98%   BMI 28.89 kg/m     Wt Readings from Last 3 Encounters:  06/02/20 190 lb (86.2 kg)  05/08/20 194 lb 6.4 oz (88.2 kg)  05/01/19 224 lb 11.2 oz (101.9 kg)     GEN:  Well nourished, well developed in no acute distress HEENT: Normal NECK: No JVD; No carotid bruits LYMPHATICS: No lymphadenopathy CARDIAC: RRR, occasional skipped beats, no murmurs, rubs, gallops RESPIRATORY:  Clear to auscultation without rales, wheezing or rhonchi  ABDOMEN: Soft, non-tender, non-distended MUSCULOSKELETAL:  No edema; No deformity  SKIN: Warm and dry NEUROLOGIC:  Alert and oriented x 3 PSYCHIATRIC:  Normal affect   ASSESSMENT:    1. Irregular heart beats    PLAN:    In order of problems listed above:  1. Patient with occasional skipped beats on physical exam.  History of floaters lasting couple of minutes multiple days a week.  Will evaluate patient with a cardiac monitor for any significant arrhythmias.  If no significant arrhythmias are found, trial of  beta-blocker pending for follow-up.  EKG in the office today shows occasional PVCs.  Follow-up after cardiac monitor.  This note was generated in part or whole with voice recognition software. Voice recognition is usually quite accurate but there are transcription errors that can and very often do occur. I apologize for any typographical errors that were not detected and corrected.  Medication Adjustments/Labs and Tests Ordered: Current medicines are reviewed at length with the patient today.  Concerns regarding medicines are outlined above.  Orders Placed This Encounter  Procedures  . LONG TERM MONITOR (3-14 DAYS)  . EKG 12-Lead   No orders of the defined types were placed in this  encounter.   Patient Instructions  Medication Instructions:  No Changes *If you need a refill on your cardiac medications before your next appointment, please call your pharmacy*   Lab Work: None Ordered If you have labs (blood work) drawn today and your tests are completely normal, you will receive your results only by: Marland Kitchen MyChart Message (if you have MyChart) OR . A paper copy in the mail If you have any lab test that is abnormal or we need to change your treatment, we will call you to review the results.   Testing/Procedures: Your physician has recommended that you wear a Zio monitor. This monitor is a medical device that records the heart's electrical activity. Doctors most often use these monitors to diagnose arrhythmias. Arrhythmias are problems with the speed or rhythm of the heartbeat. The monitor is a small device applied to your chest. You can wear one while you do your normal daily activities. While wearing this monitor if you have any symptoms to push the button and record what you felt. Once you have worn this monitor for the period of time provider prescribed (Usually 14 days), you will return the monitor device in the postage paid box. Once it is returned they will download the data collected  and provide [place] with a report which the provider will then review and we will call you with those results. Important tips:  1. Avoid showering during the first 24 hours of wearing the monitor. 2. Avoid excessive sweating to help maximize wear time. 3. Do not submerge the device, no hot tubs, and no swimming pools. 4. Keep any lotions or oils away from the patch. 5. After 24 hours you may shower with the patch on. Take brief showers with your back facing the shower head.  6. Do not remove patch once it has been placed because that will interrupt data and decrease adhesive wear time. 7. Push the button when you have any symptoms and write down what you were feeling. 8. Once you have completed wearing your monitor, remove and place into box which has postage paid and place in your outgoing mailbox.  9. If for some reason you have misplaced your box then call our office and we can provide another box and/or mail it off for you.         Follow-Up: At [facility], you and your health needs are our priority.  As part of our continuing mission to provide you with exceptional heart care, we have created designated Provider Care Teams.  These Care Teams include your primary Cardiologist (physician) and Advanced Practice Providers (APPs -  Physician Assistants and Nurse Practitioners) who all work together to provide you with the care you need, when you need it.  We recommend signing up for the patient portal called "MyChart".  Sign up information is provided on this After Visit Summary.  MyChart is used to connect with patients for Virtual Visits (Telemedicine).  Patients are able to view lab/test results, encounter notes, upcoming appointments, etc.  Non-urgent messages can be sent to your provider as well.   To learn more about what you can do with MyChart, go to [email or web address].    Your next appointment:   5 week(s)  The format for your next appointment:   In Person  Provider:     Debbe Odea, MD   Other Instructions      Signed, Debbe Odea, MD  06/02/2020 12:43 PM    [facility]

## 2020-06-02 NOTE — Patient Instructions (Signed)
Medication Instructions:  No Changes *If you need a refill on your cardiac medications before your next appointment, please call your pharmacy*   Lab Work: None Ordered If you have labs (blood work) drawn today and your tests are completely normal, you will receive your results only by: Marland Kitchen MyChart Message (if you have MyChart) OR . A paper copy in the mail If you have any lab test that is abnormal or we need to change your treatment, we will call you to review the results.   Testing/Procedures: Your physician has recommended that you wear a Zio monitor. This monitor is a medical device that records the heart's electrical activity. Doctors most often use these monitors to diagnose arrhythmias. Arrhythmias are problems with the speed or rhythm of the heartbeat. The monitor is a small device applied to your chest. You can wear one while you do your normal daily activities. While wearing this monitor if you have any symptoms to push the button and record what you felt. Once you have worn this monitor for the period of time provider prescribed (Usually 14 days), you will return the monitor device in the postage paid box. Once it is returned they will download the data collected and provide [place] with a report which the provider will then review and we will call you with those results. Important tips:  1. Avoid showering during the first 24 hours of wearing the monitor. 2. Avoid excessive sweating to help maximize wear time. 3. Do not submerge the device, no hot tubs, and no swimming pools. 4. Keep any lotions or oils away from the patch. 5. After 24 hours you may shower with the patch on. Take brief showers with your back facing the shower head.  6. Do not remove patch once it has been placed because that will interrupt data and decrease adhesive wear time. 7. Push the button when you have any symptoms and write down what you were feeling. 8. Once you have completed wearing your monitor, remove and  place into box which has postage paid and place in your outgoing mailbox.  9. If for some reason you have misplaced your box then call our office and we can provide another box and/or mail it off for you.         Follow-Up: At [facility], you and your health needs are our priority.  As part of our continuing mission to provide you with exceptional heart care, we have created designated Provider Care Teams.  These Care Teams include your primary Cardiologist (physician) and Advanced Practice Providers (APPs -  Physician Assistants and Nurse Practitioners) who all work together to provide you with the care you need, when you need it.  We recommend signing up for the patient portal called "MyChart".  Sign up information is provided on this After Visit Summary.  MyChart is used to connect with patients for Virtual Visits (Telemedicine).  Patients are able to view lab/test results, encounter notes, upcoming appointments, etc.  Non-urgent messages can be sent to your provider as well.   To learn more about what you can do with MyChart, go to [email or web address].    Your next appointment:   5 week(s)  The format for your next appointment:   In Person  Provider:   Debbe Odea, MD   Other Instructions

## 2020-06-06 ENCOUNTER — Ambulatory Visit: Payer: BC Managed Care – PPO | Admitting: Family Medicine

## 2020-06-17 ENCOUNTER — Telehealth: Payer: Self-pay

## 2020-06-17 NOTE — Telephone Encounter (Signed)
Copied from CRM [id number]. Topic: Referral - Request for Referral >> Jun 17, 2020 10:08 AM Leafy Ro wrote: Has patient seen PCP for this complaint? Yes. Pt would like a referral to GI specialist for rectal bleeding. Pt will travel to [place], [place]. Pt called [facility] and their next new pt appt is nov 2021. Pt does not want to wait that long. Pt has [employer]

## 2020-06-17 NOTE — Telephone Encounter (Signed)
Can we refer or does she need an appt

## 2020-06-24 ENCOUNTER — Telehealth: Payer: Self-pay

## 2020-06-24 NOTE — Telephone Encounter (Signed)
Called and left a detailed VM per patients DPR on file with Zio monitor results.

## 2020-07-10 ENCOUNTER — Other Ambulatory Visit: Payer: Self-pay

## 2020-07-10 ENCOUNTER — Ambulatory Visit: Payer: BC Managed Care – PPO | Admitting: Cardiology

## 2020-07-10 ENCOUNTER — Encounter: Payer: Self-pay | Admitting: Cardiology

## 2020-07-10 ENCOUNTER — Telehealth: Payer: Self-pay | Admitting: Nurse Practitioner

## 2020-07-10 VITALS — BP 118/70 | HR 71 | Ht 68.0 in | Wt 183.0 lb

## 2020-07-10 DIAGNOSIS — I493 Ventricular premature depolarization: Secondary | ICD-10-CM

## 2020-07-10 DIAGNOSIS — I499 Cardiac arrhythmia, unspecified: Secondary | ICD-10-CM | POA: Diagnosis not present

## 2020-07-10 MED ORDER — METOPROLOL SUCCINATE ER 25 MG PO TB24: 25.0000 mg | ORAL_TABLET | Freq: Every day | ORAL | 5 refills | Status: DC

## 2020-07-10 NOTE — Progress Notes (Signed)
Cardiology Office Note:    Date:  07/10/2020   ID:  Elizabeth Case, DOB 1971-01-31, MRN [id number]  PCP:  Danelle Berry, PA-C  [facility] Cardiologist:  Debbe Odea, MD  [facility] Electrophysiologist:  None   Referring MD: Danelle Berry, PA-C   Chief Complaint  Patient presents with  . follow up    History of Present Illness:    Elizabeth Case is a 49 y.o. female with a hx of GERD who presents for follow-up.  Patient last seen due to irregular heartbeats lasting over a month.  Denies chest pain shortness of breath or syncope.  A 2-week cardiac monitor was placed to evaluate any significant arrhythmias.  Previous physical exam did not note skipped heartbeats, EKG showing frequent PVCs.  She still has occasional PVCs.  States feeling her heart racing whenever she drinks caffeinated drinks.    Past Medical History:  Diagnosis Date  . Allergic rhinitis   . Eczema   . GERD (gastroesophageal reflux disease)   . Migraines    1x/mo  . Vitamin D deficiency disease     Past Surgical History:  Procedure Laterality Date  . BREAST BIOPSY    . BREAST EXCISIONAL BIOPSY Right   . BREAST REDUCTION SURGERY  1994  . BREAST SURGERY Right 2012   lumpectomy  . CHOLECYSTECTOMY  1998  . COLONOSCOPY WITH PROPOFOL N/A 09/08/2016   Procedure: COLONOSCOPY WITH PROPOFOL;  Surgeon: Wyline Mood, MD;  Location: [facility];  Service: Endoscopy;  Laterality: N/A;  Latex sensitivity  . REDUCTION MAMMAPLASTY Bilateral 1994    Current Medications: Current Meds  Medication Sig  . levonorgestrel (MIRENA) 20 MCG/24HR IUD by Intrauterine route once for 1 dose.  . Multiple Vitamin (MULTIVITAMIN) tablet Take 1 tablet by mouth daily.  Marland Kitchen triamcinolone (KENALOG) 0.025 % cream Apply 1 application topically 2 (two) times daily.     Allergies:   Kiwi extract, Latex, and Other   Social History   Socioeconomic History  . Marital status: Married    Spouse name: Vincenza Hews  . Number of  children: 2  . Years of education: 16  . Highest education level: Bachelor's degree (e.g., BA, AB, BS)  Occupational History  . Not on file  Tobacco Use  . Smoking status: Never Smoker  . Smokeless tobacco: Never Used  Vaping Use  . Vaping Use: Never used  Substance and Sexual Activity  . Alcohol use: Yes    Alcohol/week: 0.0 standard drinks    Comment: holidays  . Drug use: No  . Sexual activity: Yes    Birth control/protection: I.U.D.  Other Topics Concern  . Not on file  Social History Narrative  . Not on file   Social Determinants of Health   Financial Resource Strain:   . Difficulty of Paying Living Expenses: Not on file  Food Insecurity:   . Worried About Programme researcher, broadcasting/film/video in the Last Year: Not on file  . Ran Out of Food in the Last Year: Not on file  Transportation Needs:   . Lack of Transportation (Medical): Not on file  . Lack of Transportation (Non-Medical): Not on file  Physical Activity:   . Days of Exercise per Week: Not on file  . Minutes of Exercise per Session: Not on file  Stress:   . Feeling of Stress : Not on file  Social Connections:   . Frequency of Communication with Friends and Family: Not on file  . Frequency of Social Gatherings with Friends  and Family: Not on file  . Attends Religious Services: Not on file  . Active Member of Clubs or Organizations: Not on file  . Attends Banker Meetings: Not on file  . Marital Status: Not on file     Family History: The patient's family history includes Aneurysm in her paternal grandfather; Breast cancer in her paternal grandmother; Cancer in her paternal grandmother; Cancer (age of onset: 7) in her father; Hypertension in her father and mother; Pulmonary fibrosis in her mother; Stroke in her maternal grandmother and paternal grandfather. There is no history of Diabetes or Heart disease.  ROS:   Please see the history of present illness.     All other systems reviewed and are  negative.  EKGs/Labs/Other Studies Reviewed:    The following studies were reviewed today:   EKG:  EKG is  ordered today.  The ekg ordered today demonstrates sinus rhythm, frequent PVC's.  Recent Labs: 05/08/2020: ALT 11; BUN 8; Creat 0.63; Hemoglobin 15.0; Platelets 245; Potassium 3.9; Sodium 138; TSH 2.02  Recent Lipid Panel    Component Value Date/Time   CHOL 144 05/08/2020 1533   TRIG 70 05/08/2020 1533   HDL 42 (L) 05/08/2020 1533   CHOLHDL 3.4 05/08/2020 1533   VLDL 20 02/08/2017 1418   LDLCALC 86 05/08/2020 1533    Physical Exam:    VS:  BP 118/70   Pulse 71   Ht 5\' 8"  (1.727 m)   Wt 183 lb (83 kg)   SpO2 100%   BMI 27.83 kg/m     Wt Readings from Last 3 Encounters:  07/10/20 183 lb (83 kg)  06/02/20 190 lb (86.2 kg)  05/08/20 194 lb 6.4 oz (88.2 kg)     GEN:  Well nourished, well developed in no acute distress HEENT: Normal NECK: No JVD; No carotid bruits LYMPHATICS: No lymphadenopathy CARDIAC: RRR, occasional skipped beats, no murmurs, rubs, gallops RESPIRATORY:  Clear to auscultation without rales, wheezing or rhonchi  ABDOMEN: Soft, non-tender, non-distended MUSCULOSKELETAL:  No edema; No deformity  SKIN: Warm and dry NEUROLOGIC:  Alert and oriented x 3 PSYCHIATRIC:  Normal affect   ASSESSMENT:    1. Frequent unifocal PVCs   2. Irregular heart beats    PLAN:    In order of problems listed above:  1. Patient with history of irregular heartbeats.  Cardiac monitor did reveal frequent PVCs, 15% burden.  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  We will start Toprol-XL 25mg  qd to help suppress PVC burden.  Get echocardiogram to evaluate presence of cardiac dysfunction.  If EF is low, will consider EP referral for possible ablation after ruling out CAD.  Follow-up after echo  Total encounter time 40 minutes  Greater than 50% was spent in counseling and coordination of care with the  patient   This note was generated in part or whole with voice recognition software. Voice recognition is usually quite accurate but there are transcription errors that can and very often do occur. I apologize for any typographical errors that were not detected and corrected.  Medication Adjustments/Labs and Tests Ordered: Current medicines are reviewed at length with the patient today.  Concerns regarding medicines are outlined above.  Orders Placed This Encounter  Procedures  . ECHOCARDIOGRAM COMPLETE   Meds ordered this encounter  Medications  . metoprolol succinate (TOPROL-XL) 25 MG 24 hr tablet    Sig: Take 1 tablet (25 mg total) by mouth daily.  Take with or immediately following a meal.    Dispense:  30 tablet    Refill:  5    Patient Instructions  Medication Instructions:  Your physician has recommended you make the following change in your medication:   START metoprolol succinate (TOPROL-XL) 25 MG 24 hr tablet: Take 1 tablet (25 mg total) by mouth daily.  *If you need a refill on your cardiac medications before your next appointment, please call your pharmacy*   Lab Work: None Ordered If you have labs (blood work) drawn today and your tests are completely normal, you will receive your results only by: Marland Kitchen MyChart Message (if you have MyChart) OR . A paper copy in the mail If you have any lab test that is abnormal or we need to change your treatment, we will call you to review the results.   Testing/Procedures:  Your physician has requested that you have an echocardiogram. Echocardiography is a painless test that uses sound waves to create images of your heart. It provides your doctor with information about the size and shape of your heart and how well your heart's chambers and valves are working. This procedure takes approximately one hour. There are no restrictions for this procedure.   Follow-Up: At [facility], you and your health needs are our priority.  As part  of our continuing mission to provide you with exceptional heart care, we have created designated Provider Care Teams.  These Care Teams include your primary Cardiologist (physician) and Advanced Practice Providers (APPs -  Physician Assistants and Nurse Practitioners) who all work together to provide you with the care you need, when you need it.  We recommend signing up for the patient portal called "MyChart".  Sign up information is provided on this After Visit Summary.  MyChart is used to connect with patients for Virtual Visits (Telemedicine).  Patients are able to view lab/test results, encounter notes, upcoming appointments, etc.  Non-urgent messages can be sent to your provider as well.   To learn more about what you can do with MyChart, go to [email or web address].    Your next appointment:   Follow up after Echo   The format for your next appointment:   In Person  Provider:   Debbe Odea, MD   Other Instructions      Signed, Debbe Odea, MD  07/10/2020 1:04 PM    [facility]

## 2020-07-10 NOTE — Telephone Encounter (Signed)
Called pt back to schedule, no answer, left vm. If pt calls back please contact the office.  Copied from CRM [id number]. Topic: General - Inquiry >> Jul 10, 2020  1:51 PM Elizabeth Case wrote: Reason for CRM: Patient states that her husband Elizabeth Case is a patient of Dr.Cannady.  Patient husband saw [place] today and [place] states that she would take wife on as a new patient.  Patient is calling to check on status. Call back [phone]

## 2020-07-10 NOTE — Patient Instructions (Signed)
Medication Instructions:  Your physician has recommended you make the following change in your medication:   START metoprolol succinate (TOPROL-XL) 25 MG 24 hr tablet: Take 1 tablet (25 mg total) by mouth daily.  *If you need a refill on your cardiac medications before your next appointment, please call your pharmacy*   Lab Work: None Ordered If you have labs (blood work) drawn today and your tests are completely normal, you will receive your results only by:  MyChart Message (if you have MyChart) OR  A paper copy in the mail If you have any lab test that is abnormal or we need to change your treatment, we will call you to review the results.   Testing/Procedures:  Your physician has requested that you have an echocardiogram. Echocardiography is a painless test that uses sound waves to create images of your heart. It provides your doctor with information about the size and shape of your heart and how well your hearts chambers and valves are working. This procedure takes approximately one hour. There are no restrictions for this procedure.   Follow-Up: At [facility], you and your health needs are our priority.  As part of our continuing mission to provide you with exceptional heart care, we have created designated Provider Care Teams.  These Care Teams include your primary Cardiologist (physician) and Advanced Practice Providers (APPs -  Physician Assistants and Nurse Practitioners) who all work together to provide you with the care you need, when you need it.  We recommend signing up for the patient portal called "MyChart".  Sign up information is provided on this After Visit Summary.  MyChart is used to connect with patients for Virtual Visits (Telemedicine).  Patients are able to view lab/test results, encounter notes, upcoming appointments, etc.  Non-urgent messages can be sent to your provider as well.   To learn more about what you can do with MyChart, go to  [email or web address].    Your next appointment:   Follow up after Echo   The format for your next appointment:   In Person  Provider:   Debbe Odea, MD   Other Instructions

## 2020-07-28 ENCOUNTER — Encounter: Payer: Self-pay | Admitting: Family Medicine

## 2020-07-28 ENCOUNTER — Telehealth: Payer: Self-pay | Admitting: Nurse Practitioner

## 2020-07-28 NOTE — Telephone Encounter (Signed)
Called pt to r/s np appt no answer left vm

## 2020-07-29 ENCOUNTER — Ambulatory Visit: Payer: BC Managed Care – PPO | Admitting: Nurse Practitioner

## 2020-07-29 MED ORDER — POLYETHYLENE GLYCOL 3350 17 GM/SCOOP PO POWD: 17.0000 g | Freq: Every day | ORAL | 1 refills | Status: DC

## 2020-07-30 ENCOUNTER — Other Ambulatory Visit: Payer: Self-pay

## 2020-07-30 ENCOUNTER — Ambulatory Visit
Admission: RE | Admit: 2020-07-30 | Discharge: 2020-07-30 | Disposition: A | Discharge status: Home / Self Care | Payer: BC Managed Care – PPO | Source: Ambulatory Visit | Attending: Emergency Medicine | Admitting: Emergency Medicine

## 2020-07-30 VITALS — BP 113/65 | HR 79 | Temp 98.3°F | Resp 18 | Ht 68.0 in | Wt 183.0 lb

## 2020-07-30 DIAGNOSIS — H9222 Otorrhagia, left ear: Secondary | ICD-10-CM | POA: Diagnosis not present

## 2020-07-30 MED ORDER — NEOMYCIN-POLYMYXIN-HC 3.5-10000-1 OT SUSP: 4.0000 [drp] | Freq: Three times a day (TID) | OTIC | 0 refills | Status: DC

## 2020-07-30 NOTE — ED Provider Notes (Signed)
[facility]    CSN: [id number] Arrival date & time: 07/30/20  1139      History   Chief Complaint Chief Complaint  Patient presents with  . Appointment  . Otalgia    HPI Elizabeth Case is a 49 y.o. female.   48 yo female here for evaluation of bleeding from her left ear. ' She reports that she felt like her ear was draining last night, inserted a Q-tip and it had blood on it when she removed it. She has had some ear pain since the bleeding started.  She denies changes to hearing, fever, ringing in her ears, cough, or ST. She has ahd some nasal congestion consistent with her allergies.       Past Medical History:  Diagnosis Date  . Allergic rhinitis   . Eczema   . GERD (gastroesophageal reflux disease)   . Migraines    1x/mo  . Vitamin D deficiency disease     Patient Active Problem List   Diagnosis Date Noted  . First degree hemorrhoids   . Allergic rhinitis   . Migraines   . Eczema     Past Surgical History:  Procedure Laterality Date  . BREAST BIOPSY    . BREAST EXCISIONAL BIOPSY Right   . BREAST REDUCTION SURGERY  1994  . BREAST SURGERY Right 2012   lumpectomy  . CHOLECYSTECTOMY  1998  . COLONOSCOPY WITH PROPOFOL N/A 09/08/2016   Procedure: COLONOSCOPY WITH PROPOFOL;  Surgeon: Wyline Mood, MD;  [facility]: [facility];  Service: Endoscopy;  Laterality: N/A;  Latex sensitivity  . REDUCTION MAMMAPLASTY Bilateral 1994    OB History    Gravida  2   Para  2   Term  2   Preterm      AB      Living  2     SAB      TAB      Ectopic      Multiple      Live Births  2        Obstetric Comments  1st Menstrual Cycle: 14  1st Pregnancy:  26          Home Medications    Prior to Admission medications   Medication Sig Start Date End Date Taking? Authorizing Provider  metoprolol succinate (TOPROL-XL) 25 MG 24 hr tablet Take 1 tablet (25 mg total) by mouth daily. Take with or immediately following a meal. 07/10/20  10/08/20 Yes Agbor-Etang, Arlys John, MD  Multiple Vitamin (MULTIVITAMIN) tablet Take 1 tablet by mouth daily.   Yes [provider]  polyethylene glycol powder (GLYCOLAX/MIRALAX) 17 GM/SCOOP powder Take 17 g by mouth daily. 07/29/20  Yes Danelle Berry, PA-C  triamcinolone (KENALOG) 0.025 % cream Apply 1 application topically 2 (two) times daily.   Yes [provider]  levonorgestrel (MIRENA) 20 MCG/24HR IUD by Intrauterine route once for 1 dose. 12/28/17 07/10/20  Copland, Ilona Sorrel, PA-C  neomycin-polymyxin-hydrocortisone (CORTISPORIN) 3.5-10000-1 OTIC suspension Place 4 drops into the left ear 3 (three) times daily. 07/30/20   Becky Augusta, NP    Family History Family History  Problem Relation Age of Onset  . Hypertension Mother   . Pulmonary fibrosis Mother   . Hypertension Father   . Cancer Father 17       prostate   . Cancer Paternal Grandmother        breast  . Breast cancer Paternal Grandmother   . Stroke Paternal Grandfather  cerebral aneurysm caused the stroke  . Aneurysm Paternal Grandfather   . Stroke Maternal Grandmother   . Diabetes Neg Hx   . Heart disease Neg Hx     Social History Social History   Tobacco Use  . Smoking status: Never Smoker  . Smokeless tobacco: Never Used  Vaping Use  . Vaping Use: Never used  Substance Use Topics  . Alcohol use: Yes    Alcohol/week: 0.0 standard drinks    Comment: holidays  . Drug use: No     Allergies   Kiwi extract, Latex, and Other   Review of Systems Review of Systems  Constitutional: Negative for activity change, chills and fever.  HENT: Positive for congestion, ear discharge, ear pain and rhinorrhea. Negative for postnasal drip, sore throat and tinnitus.        Blood  Respiratory: Negative for cough and shortness of breath.   Cardiovascular: Negative for chest pain.  Gastrointestinal: Negative for nausea and vomiting.  Genitourinary: Negative for difficulty urinating and frequency.    Musculoskeletal: Negative for arthralgias and myalgias.  Skin: Negative.   Neurological: Negative for dizziness, syncope and headaches.  Hematological: Negative.   Psychiatric/Behavioral: Negative.      Physical Exam Triage Vital Signs ED Triage Vitals  Enc Vitals Group     BP 07/30/20 1241 113/65     Pulse Rate 07/30/20 1241 79     Resp 07/30/20 1241 18     Temp 07/30/20 1241 98.3 F (36.8 C)     Temp Source 07/30/20 1241 Oral     SpO2 07/30/20 1241 99 %     Weight 07/30/20 1237 182 lb 15.7 oz (83 kg)     Height 07/30/20 1237 5\' 8"  (1.727 m)     Head Circumference --      Peak Flow --      Pain Score 07/30/20 1235 2     Pain Loc --      Pain Edu? --      Excl. in GC? --    No data found.  Updated Vital Signs BP 113/65 (BP Location: Right Arm)   Pulse 79   Temp 98.3 F (36.8 C) (Oral)   Resp 18   Ht 5\' 8"  (1.727 m)   Wt 182 lb 15.7 oz (83 kg)   SpO2 99%   BMI 27.82 kg/m   Visual Acuity Right Eye Distance:   Left Eye Distance:   Bilateral Distance:    Right Eye Near:   Left Eye Near:    Bilateral Near:     Physical Exam Vitals and nursing note reviewed.  Constitutional:      Appearance: Normal appearance.  HENT:     Head: Normocephalic and atraumatic.     Right Ear: Tympanic membrane, ear canal and external ear normal.     Left Ear: Tympanic membrane and external ear normal.     Ears:     Comments: There is some venous oozing for a scrape in the left EAC. The TM's are clear bilaterally. There is no visible lesion. Bleeding is bright red.  Eyes:     Extraocular Movements: Extraocular movements intact.     Conjunctiva/sclera: Conjunctivae normal.     Pupils: Pupils are equal, round, and reactive to light.  Cardiovascular:     Rate and Rhythm: Normal rate and regular rhythm.     Pulses: Normal pulses.     Heart sounds: Normal heart sounds.  Pulmonary:     Effort: Pulmonary effort is  normal.     Breath sounds: Normal breath sounds.   Musculoskeletal:        General: Normal range of motion.     Cervical back: Normal range of motion and neck supple.  Skin:    General: Skin is warm and dry.     Capillary Refill: Capillary refill takes less than 2 seconds.  Neurological:     General: No focal deficit present.     Mental Status: She is alert and oriented to person, place, and time.  Psychiatric:        Mood and Affect: Mood normal.        Behavior: Behavior normal.        Thought Content: Thought content normal.        Judgment: Judgment normal.      UC Treatments / Results  Labs (all labs ordered are listed, but only abnormal results are displayed) Labs Reviewed - No data to display  EKG   Radiology No results found.  Procedures Procedures (including critical care time)  Medications Ordered in UC Medications - No data to display  Initial Impression / Assessment and Plan / UC Course  I have reviewed the triage vital signs and the nursing notes.  Pertinent labs & imaging results that were available during my care of the patient were reviewed by me and considered in my medical decision making (see chart for details).   Patient has minor bleeding from the left EAC not in the setting of trauma or cold symptoms. Blood easily cleared with cotton tipped applicator and there is a minor scrape to the mucosa noted. NO lesions or erosions noted. No erythema.  Will treat prophylactic with Cortisporin Otic and have her follow-up with ENT if symptoms persist.   Final Clinical Impressions(s) / UC Diagnoses   Final diagnoses:  Bleeding from ear, left     Discharge Instructions     Instill 4 drops of Cortisporin otic in your left ear 4 times a day for 5 days.  Avoid putting Q-tips in ear.  If your symptoms continue follow-up with Dr. Willeen Cass at [facility].     ED Prescriptions    Medication Sig Dispense Auth. Provider   neomycin-polymyxin-hydrocortisone (CORTISPORIN) 3.5-10000-1 OTIC suspension Place  4 drops into the left ear 3 (three) times daily. 10 mL Becky Augusta, NP     PDMP not reviewed this encounter.   Becky Augusta, NP 07/30/20 1328

## 2020-07-30 NOTE — Discharge Instructions (Addendum)
Instill 4 drops of Cortisporin otic in your left ear 4 times a day for 5 days.  Avoid putting Q-tips in ear.  If your symptoms continue follow-up with Dr. Willeen Cass at [facility].

## 2020-07-30 NOTE — ED Triage Notes (Signed)
Patient c/o left ear that started bleeding last night. She also reports pain in her left ear.

## 2020-07-31 ENCOUNTER — Ambulatory Visit (INDEPENDENT_AMBULATORY_CARE_PROVIDER_SITE_OTHER): Payer: BC Managed Care – PPO

## 2020-07-31 DIAGNOSIS — I493 Ventricular premature depolarization: Secondary | ICD-10-CM

## 2020-07-31 LAB — ECHOCARDIOGRAM COMPLETE
AR max vel: 3.31 cm2
AV Area VTI: 3.28 cm2
AV Area mean vel: 3.19 cm2
AV Mean grad: 2 mmHg
AV Peak grad: 4.3 mmHg
Ao pk vel: 1.03 m/s
Area-P 1/2: 4.04 cm2
Calc EF: 54.8 %
S' Lateral: 3.5 cm
Single Plane A2C EF: 58.3 %
Single Plane A4C EF: 51.4 %

## 2020-08-04 ENCOUNTER — Telehealth: Payer: Self-pay

## 2020-08-04 NOTE — Telephone Encounter (Signed)
The patient has been notified of the result via VM per DPR on file. Encouraged patient to call back with any questions or concerns.  "Normal systolic and diastolic function, normal echocardiogram."

## 2020-08-07 ENCOUNTER — Ambulatory Visit: Payer: BC Managed Care – PPO | Admitting: Cardiology

## 2020-08-07 ENCOUNTER — Encounter: Payer: Self-pay | Admitting: Cardiology

## 2020-08-07 ENCOUNTER — Other Ambulatory Visit: Payer: Self-pay

## 2020-08-07 VITALS — BP 100/60 | HR 68 | Ht 68.0 in | Wt 183.1 lb

## 2020-08-07 DIAGNOSIS — I493 Ventricular premature depolarization: Secondary | ICD-10-CM

## 2020-08-07 DIAGNOSIS — R42 Dizziness and giddiness: Secondary | ICD-10-CM

## 2020-08-07 NOTE — Progress Notes (Signed)
Cardiology Office Note:    Date:  08/07/2020   ID:  Elizabeth Case, DOB [birth year], MRN [id number]  PCP:  Danelle Berry, PA-C  [facility] Cardiologist:  Debbe Odea, MD  [facility] Electrophysiologist:  None   Referring MD: Danelle Berry, PA-C   Chief Complaint  Patient presents with  . other    Follow up Echo results. Meds reviewed by the pt. verbally. Pt. c/o dizziness with position changes & fatigue.     History of Present Illness:    Elizabeth Case is a 49 y.o. female with a hx of GERD, frequent PVCs who presents for follow-up.  She was previously seen due to irregular heartbeats.  Cardiac monitor showed frequent PVCs with 15% burden.  She was started on Toprol-XL to help suppress ectopy.  Echocardiogram was also ordered to evaluate any significant cardiac dysfunction due to high PVC burden.  She states Toprol XL made her fatigue and dizzy. She denies any symptoms of palpitations. Does not drink caffeinated products.    Past Medical History:  Diagnosis Date  . Allergic rhinitis   . Eczema   . GERD (gastroesophageal reflux disease)   . Migraines    1x/mo  . Vitamin D deficiency disease     Past Surgical History:  Procedure Laterality Date  . BREAST BIOPSY    . BREAST EXCISIONAL BIOPSY Right   . BREAST REDUCTION SURGERY  1994  . BREAST SURGERY Right 2012   lumpectomy  . CHOLECYSTECTOMY  1998  . COLONOSCOPY WITH PROPOFOL N/A 09/08/2016   Procedure: COLONOSCOPY WITH PROPOFOL;  Surgeon: Wyline Mood, MD;  Location: [facility];  Service: Endoscopy;  Laterality: N/A;  Latex sensitivity  . REDUCTION MAMMAPLASTY Bilateral 1994    Current Medications: Current Meds  Medication Sig  . levonorgestrel (MIRENA) 20 MCG/24HR IUD by Intrauterine route once for 1 dose.  . Multiple Vitamin (MULTIVITAMIN) tablet Take 1 tablet by mouth daily.  Marland Kitchen neomycin-polymyxin-hydrocortisone (CORTISPORIN) 3.5-10000-1 OTIC suspension Place 4 drops into the left ear 3  (three) times daily.  . polyethylene glycol powder (GLYCOLAX/MIRALAX) 17 GM/SCOOP powder Take 17 g by mouth daily.  Marland Kitchen triamcinolone (KENALOG) 0.025 % cream Apply 1 application topically 2 (two) times daily.  . [DISCONTINUED] metoprolol succinate (TOPROL-XL) 25 MG 24 hr tablet Take 1 tablet (25 mg total) by mouth daily. Take with or immediately following a meal.     Allergies:   Kiwi extract, Latex, and Other   Social History   Socioeconomic History  . Marital status: Married    Spouse name: Vincenza Hews  . Number of children: 2  . Years of education: 16  . Highest education level: Bachelor's degree (e.g., BA, AB, BS)  Occupational History  . Not on file  Tobacco Use  . Smoking status: Never Smoker  . Smokeless tobacco: Never Used  Vaping Use  . Vaping Use: Never used  Substance and Sexual Activity  . Alcohol use: Yes    Alcohol/week: 0.0 standard drinks    Comment: holidays  . Drug use: No  . Sexual activity: Yes    Birth control/protection: I.U.D.  Other Topics Concern  . Not on file  Social History Narrative  . Not on file   Social Determinants of Health   Financial Resource Strain:   . Difficulty of Paying Living Expenses: Not on file  Food Insecurity:   . Worried About Programme researcher, broadcasting/film/video in the Last Year: Not on file  . Ran Out of Food in the Last Year:  Not on file  Transportation Needs:   . Lack of Transportation (Medical): Not on file  . Lack of Transportation (Non-Medical): Not on file  Physical Activity:   . Days of Exercise per Week: Not on file  . Minutes of Exercise per Session: Not on file  Stress:   . Feeling of Stress : Not on file  Social Connections:   . Frequency of Communication with Friends and Family: Not on file  . Frequency of Social Gatherings with Friends and Family: Not on file  . Attends Religious Services: Not on file  . Active Member of Clubs or Organizations: Not on file  . Attends Banker Meetings: Not on file  . Marital  Status: Not on file     Family History: The patient's family history includes Aneurysm in her paternal grandfather; Breast cancer in her paternal grandmother; Cancer in her paternal grandmother; Cancer (age of onset: 84) in her father; Hypertension in her father and mother; Pulmonary fibrosis in her mother; Stroke in her maternal grandmother and paternal grandfather. There is no history of Diabetes or Heart disease.  ROS:   Please see the history of present illness.     All other systems reviewed and are negative.  EKGs/Labs/Other Studies Reviewed:    The following studies were reviewed today:   EKG:  EKG is  ordered today.  The ekg ordered today demonstrates sinus rhythm, frequent PVC's.  Recent Labs: 05/08/2020: ALT 11; BUN 8; Creat 0.63; Hemoglobin 15.0; Platelets 245; Potassium 3.9; Sodium 138; TSH 2.02  Recent Lipid Panel    Component Value Date/Time   CHOL 144 05/08/2020 1533   TRIG 70 05/08/2020 1533   HDL 42 (L) 05/08/2020 1533   CHOLHDL 3.4 05/08/2020 1533   VLDL 20 02/08/2017 1418   LDLCALC 86 05/08/2020 1533    Physical Exam:    VS:  BP 100/60 (BP Location: Left Arm, Patient Position: Sitting, Cuff Size: Normal)   Pulse 68   Ht 5\' 8"  (1.727 m)   Wt 183 lb 2 oz (83.1 kg)   SpO2 98%   BMI 27.84 kg/m     Wt Readings from Last 3 Encounters:  08/07/20 183 lb 2 oz (83.1 kg)  07/30/20 182 lb 15.7 oz (83 kg)  07/10/20 183 lb (83 kg)     GEN:  Well nourished, well developed in no acute distress HEENT: Normal NECK: No JVD; No carotid bruits LYMPHATICS: No lymphadenopathy CARDIAC: RRR, occasional skipped beats, no murmurs, rubs, gallops RESPIRATORY:  Clear to auscultation without rales, wheezing or rhonchi  ABDOMEN: Soft, non-tender, non-distended MUSCULOSKELETAL:  No edema; No deformity  SKIN: Warm and dry NEUROLOGIC:  Alert and oriented x 3 PSYCHIATRIC:  Normal affect   ASSESSMENT:    1. Frequent unifocal PVCs   2. Dizziness    PLAN:    In order of  problems listed above:  1. 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  15% burden.  Echocardiogram on 07/2020 showed normal systolic and diastolic function.  EF 55 to 60%. Patient is asymptomatic, having symptoms of fatigue and dizziness from Toprol-XL. Since echocardiogram is normal and patient is asymptomatic, will stop Toprol-XL and monitor patient clinically. Will consider serial echoes in the future if patient becomes symptomatic. Patient educated on management options for frequent PVCs including ablation if her cardiac function was to worsen in the future. 2. Dizziness likely from Toprol-XL. Stop Toprol-XL. Orthostatic vitals in the office with no evidence for orthostasis.  Follow-up in 6 months.  Total encounter time 35 minutes  Greater than 50% was spent in counseling and coordination of care with the patient   This note was generated in part or whole with voice recognition software. Voice recognition is usually quite accurate but there are transcription errors that can and very often do occur. I apologize for any typographical errors that were not detected and corrected.  Medication Adjustments/Labs and Tests Ordered: Current medicines are reviewed at length with the patient today.  Concerns regarding medicines are outlined above.  Orders Placed This Encounter  Procedures  . EKG 12-Lead   No orders of the defined types were placed in this encounter.   Patient Instructions  Medication Instructions:   Your physician has recommended you make the following change in your medication:   STOP taking Toprol XL.  *If you need a refill on your cardiac medications before your next appointment, please call your pharmacy*   Lab Work: None Ordered If you have labs (blood work) drawn today and your tests are completely normal, you will receive your results only by: Marland Kitchen MyChart Message (if you have MyChart) OR . A paper copy in the mail If you have any  lab test that is abnormal or we need to change your treatment, we will call you to review the results.   Testing/Procedures: None Ordered   Follow-Up: At [facility], you and your health needs are our priority.  As part of our continuing mission to provide you with exceptional heart care, we have created designated Provider Care Teams.  These Care Teams include your primary Cardiologist (physician) and Advanced Practice Providers (APPs -  Physician Assistants and Nurse Practitioners) who all work together to provide you with the care you need, when you need it.  We recommend signing up for the patient portal called "MyChart".  Sign up information is provided on this After Visit Summary.  MyChart is used to connect with patients for Virtual Visits (Telemedicine).  Patients are able to view lab/test results, encounter notes, upcoming appointments, etc.  Non-urgent messages can be sent to your provider as well.   To learn more about what you can do with MyChart, go to [email or web address].    Your next appointment:   6 month(s)  The format for your next appointment:   In Person  Provider:   Debbe Odea, MD   Other Instructions      Signed, Debbe Odea, MD  08/07/2020 12:25 PM    [facility]

## 2020-08-07 NOTE — Patient Instructions (Signed)
Medication Instructions:   Your physician has recommended you make the following change in your medication:   STOP taking Toprol XL.  *If you need a refill on your cardiac medications before your next appointment, please call your pharmacy*   Lab Work: None Ordered If you have labs (blood work) drawn today and your tests are completely normal, you will receive your results only by: Marland Kitchen MyChart Message (if you have MyChart) OR . A paper copy in the mail If you have any lab test that is abnormal or we need to change your treatment, we will call you to review the results.   Testing/Procedures: None Ordered   Follow-Up: At [facility], you and your health needs are our priority.  As part of our continuing mission to provide you with exceptional heart care, we have created designated Provider Care Teams.  These Care Teams include your primary Cardiologist (physician) and Advanced Practice Providers (APPs -  Physician Assistants and Nurse Practitioners) who all work together to provide you with the care you need, when you need it.  We recommend signing up for the patient portal called "MyChart".  Sign up information is provided on this After Visit Summary.  MyChart is used to connect with patients for Virtual Visits (Telemedicine).  Patients are able to view lab/test results, encounter notes, upcoming appointments, etc.  Non-urgent messages can be sent to your provider as well.   To learn more about what you can do with MyChart, go to [email or web address].    Your next appointment:   6 month(s)  The format for your next appointment:   In Person  Provider:   Debbe Odea, MD   Other Instructions

## 2020-08-13 ENCOUNTER — Ambulatory Visit: Payer: BC Managed Care – PPO | Admitting: Nurse Practitioner

## 2020-08-18 ENCOUNTER — Ambulatory Visit: Payer: BC Managed Care – PPO | Admitting: Nurse Practitioner

## 2020-08-18 ENCOUNTER — Encounter: Payer: Self-pay | Admitting: Nurse Practitioner

## 2020-08-18 ENCOUNTER — Other Ambulatory Visit: Payer: Self-pay

## 2020-08-18 VITALS — BP 122/87 | HR 93 | Temp 98.3°F | Resp 16 | Ht 68.0 in | Wt 183.0 lb

## 2020-08-18 DIAGNOSIS — I493 Ventricular premature depolarization: Secondary | ICD-10-CM | POA: Diagnosis not present

## 2020-08-18 DIAGNOSIS — L308 Other specified dermatitis: Secondary | ICD-10-CM

## 2020-08-18 DIAGNOSIS — K640 First degree hemorrhoids: Secondary | ICD-10-CM | POA: Diagnosis not present

## 2020-08-18 DIAGNOSIS — Z7689 Persons encountering health services in other specified circumstances: Secondary | ICD-10-CM

## 2020-08-18 DIAGNOSIS — K5900 Constipation, unspecified: Secondary | ICD-10-CM

## 2020-08-18 MED ORDER — TRIAMCINOLONE ACETONIDE 0.025 % EX CREA: 1.0000 "application " | TOPICAL_CREAM | Freq: Two times a day (BID) | CUTANEOUS | 5 refills | Status: DC

## 2020-08-18 NOTE — Assessment & Plan Note (Signed)
Followed by cardiology at this time.  Asymptomatic at this time.  Did not tolerate beta blocker therapy.  Continue to monitor and continue collaboration with cardiology.

## 2020-08-18 NOTE — Assessment & Plan Note (Signed)
Ongoing issue, currently not inflamed or irritated.  Recommend to continue at home OTC regimen and monitor.  If worsening symptoms return to office.

## 2020-08-18 NOTE — Patient Instructions (Addendum)
Linaclotide oral capsules What is this medicine? LINACLOTIDE (lin a KLOE tide) is used to treat irritable bowel syndrome (IBS) with constipation as the main problem. It may also be used for relief of chronic constipation. This medicine may be used for other purposes; ask your health care provider or pharmacist if you have questions. COMMON BRAND NAME(S): Linzess What should I tell my health care provider before I take this medicine? They need to know if you have any of these conditions:  history of stool (fecal) impaction  now have diarrhea or have diarrhea often  other medical condition  stomach or intestinal disease, including bowel obstruction or abdominal adhesions  an unusual or allergic reaction to linaclotide, other medicines, foods, dyes, or preservatives  pregnant or trying to get pregnant  breast-feeding How should I use this medicine? Take this medicine by mouth with a glass of water. Follow the directions on the prescription label. Do not cut, crush or chew this medicine. Take on an empty stomach, at least 30 minutes before your first meal of the day. Take your medicine at regular intervals. Do not take your medicine more often than directed. Do not stop taking except on your doctor's advice. A special MedGuide will be given to you by the pharmacist with each prescription and refill. Be sure to read this information carefully each time. Talk to your pediatrician regarding the use of this medicine in children. This medicine is not approved for use in children. Overdosage: If you think you have taken too much of this medicine contact a poison control center or emergency room at once. NOTE: This medicine is only for you. Do not share this medicine with others. What if I miss a dose? If you miss a dose, just skip that dose. Wait until your next dose, and take only that dose. Do not take double or extra doses. What may interact with this medicine?  certain medicines for bowel  problems or bladder incontinence (these can cause constipation) This list may not describe all possible interactions. Give your health care provider a list of all the medicines, herbs, non-prescription drugs, or dietary supplements you use. Also tell them if you smoke, drink alcohol, or use illegal drugs. Some items may interact with your medicine. What should I watch for while using this medicine? Visit your doctor for regular check ups. Tell your doctor if your symptoms do not get better or if they get worse. Diarrhea is a common side effect of this medicine. It often begins within 2 weeks of starting this medicine. Stop taking this medicine and call your doctor if you get severe diarrhea. Stop taking this medicine and call your doctor or go to the nearest hospital emergency room right away if you develop unusual or severe stomach-area (abdominal) pain, especially if you also have bright red, bloody stools or black stools that look like tar. What side effects may I notice from receiving this medicine? Side effects that you should report to your doctor or health care professional as soon as possible:  allergic reactions like skin rash, itching or hives, swelling of the face, lips, or tongue  black, tarry stools  bloody or watery diarrhea  new or worsening stomach pain  severe or prolonged diarrhea Side effects that usually do not require medical attention (report to your doctor or health care professional if they continue or are bothersome):  bloating  gas  loose stools This list may not describe all possible side effects. Call your doctor for medical advice  about side effects. You may report side effects to FDA at 1-800-FDA-1088. Where should I keep my medicine? Keep out of the reach of children. Store at room temperature between 20 and 25 degrees C (68 and 77 degrees F). Keep this medicine in the original container. Keep tightly closed in a dry place. Do not remove the desiccant packet  from the bottle, it helps to protect your medicine from moisture. Throw away any unused medicine after the expiration date. NOTE: This sheet is a summary. It may not cover all possible information. If you have questions about this medicine, talk to your doctor, pharmacist, or health care provider.  2020 Elsevier/Gold Standard (2015-11-20 12:17:04)   Hemorrhoids Hemorrhoids are swollen veins that may develop:  In the butt (rectum). These are called internal hemorrhoids.  Around the opening of the butt (anus). These are called external hemorrhoids. Hemorrhoids can cause pain, itching, or bleeding. Most of the time, they do not cause serious problems. They usually get better with diet changes, lifestyle changes, and other home treatments. What are the causes? This condition may be caused by:  Having trouble pooping (constipation).  Pushing hard (straining) to poop.  Watery poop (diarrhea).  Pregnancy.  Being very overweight (obese).  Sitting for long periods of time.  Heavy lifting or other activity that causes you to strain.  Anal sex.  Riding a bike for a long period of time. What are the signs or symptoms? Symptoms of this condition include:  Pain.  Itching or soreness in the butt.  Bleeding from the butt.  Leaking poop.  Swelling in the area.  One or more lumps around the opening of your butt. How is this diagnosed? A doctor can often diagnose this condition by looking at the affected area. The doctor may also:  Do an exam that involves feeling the area with a gloved hand (digital rectal exam).  Examine the area inside your butt using a small tube (anoscope).  Order blood tests. This may be done if you have lost a lot of blood.  Have you get a test that involves looking inside the colon using a flexible tube with a camera on the end (sigmoidoscopy or colonoscopy). How is this treated? This condition can usually be treated at home. Your doctor may tell you to  change what you eat, make lifestyle changes, or try home treatments. If these do not help, procedures can be done to remove the hemorrhoids or make them smaller. These may involve:  Placing rubber bands at the base of the hemorrhoids to cut off their blood supply.  Injecting medicine into the hemorrhoids to shrink them.  Shining a type of light energy onto the hemorrhoids to cause them to fall off.  Doing surgery to remove the hemorrhoids or cut off their blood supply. Follow these instructions at home: Eating and drinking   Eat foods that have a lot of fiber in them. These include whole grains, beans, nuts, fruits, and vegetables.  Ask your doctor about taking products that have added fiber (fibersupplements).  Reduce the amount of fat in your diet. You can do this by: ? Eating low-fat dairy products. ? Eating less red meat. ? Avoiding processed foods.  Drink enough fluid to keep your pee (urine) pale yellow. Managing pain and swelling   Take a warm-water bath (sitz bath) for 20 minutes to ease pain. Do this 3-4 times a day. You may do this in a bathtub or using a portable sitz bath that fits over  the toilet.  If told, put ice on the painful area. It may be helpful to use ice between your warm baths. ? Put ice in a plastic bag. ? Place a towel between your skin and the bag. ? Leave the ice on for 20 minutes, 2-3 times a day. General instructions  Take over-the-counter and prescription medicines only as told by your doctor. ? Medicated creams and medicines may be used as told.  Exercise often. Ask your doctor how much and what kind of exercise is best for you.  Go to the bathroom when you have the urge to poop. Do not wait.  Avoid pushing too hard when you poop.  Keep your butt dry and clean. Use wet toilet paper or moist towelettes after pooping.  Do not sit on the toilet for a long time.  Keep all follow-up visits as told by your doctor. This is important. Contact a  doctor if you:  Have pain and swelling that do not get better with treatment or medicine.  Have trouble pooping.  Cannot poop.  Have pain or swelling outside the area of the hemorrhoids. Get help right away if you have:  Bleeding that will not stop. Summary  Hemorrhoids are swollen veins in the butt or around the opening of the butt.  They can cause pain, itching, or bleeding.  Eat foods that have a lot of fiber in them. These include whole grains, beans, nuts, fruits, and vegetables.  Take a warm-water bath (sitz bath) for 20 minutes to ease pain. Do this 3-4 times a day. This information is not intended to replace advice given to you by your health care provider. Make sure you discuss any questions you have with your health care provider. Document Revised: 10/26/2018 Document Reviewed: 03/09/2018 Elsevier Patient Education  2020 [employer].

## 2020-08-18 NOTE — Assessment & Plan Note (Signed)
Ongoing, currently benefiting from Miralax.  Suspect underlying cause of irritation to hemorrhoids. Continue Miralax and Colace at this time.  Suspect some underlying IBS, more constipation.  If ongoing issues or worsening could trial Linzess.  Provided information to patient on this.

## 2020-08-18 NOTE — Assessment & Plan Note (Signed)
Chronic, ongoing.  Continue current medication regimen and adjust as needed.  Refills sent.    

## 2020-08-18 NOTE — Progress Notes (Signed)
New Patient Office Visit  Subjective:  Patient ID: Elizabeth Case, female    DOB: October 27, 1971  Age: 49 y.o. MRN: [id number]  CC:  Chief Complaint  Patient presents with  . New Patient (Initial Visit)    HPI Elizabeth Case presents for new patient visit to establish care.  Introduced to Publishing rights manager role and practice setting.  All questions answered.  Discussed provider/patient relationship and expectations.  Has eczema and needs refill on her Kenalog today.  HEMORRHOIDS Reports this is better now, had issues for 5 months with these.  Started taking Miralax, which is helping with regularity.  Has history of 9 pound infant, 23 years ago, and hemorrhoids started around that time. Waxes and wanes between inflammation. 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  Had monitoring performed that noted PVCs with 15% burden.  A trial of Toprol XL was given, but this made her fatigued and dizzy.  Echo in 07/2020 showed normal systolic and diastolic function with EF 55-60%.  Toprol was discontinued and the plan if to monitor clinically and if symptoms present then serial echo to be considered.  To return to cardiology in 6 months for echo.   Past Medical History:  Diagnosis Date  . Allergic rhinitis   . Eczema   . GERD (gastroesophageal reflux disease)   . Migraines    1x/mo  . Vitamin D deficiency disease     Past Surgical History:  Procedure Laterality Date  . BREAST  BIOPSY    . BREAST EXCISIONAL BIOPSY Right   . BREAST REDUCTION SURGERY  1994  . BREAST SURGERY Right 2012   lumpectomy  . CHOLECYSTECTOMY  1998  . COLONOSCOPY WITH PROPOFOL N/A 09/08/2016   Procedure: COLONOSCOPY WITH PROPOFOL;  Surgeon: Wyline Mood, MD;  Location: [facility];  Service: Endoscopy;  Laterality: N/A;  Latex sensitivity  . REDUCTION MAMMAPLASTY Bilateral 1994    Family History  Problem Relation Age of Onset  . Hypertension Mother   . Pulmonary fibrosis Mother   . Hypertension Father   . Cancer Father 36       prostate   . Cancer Paternal Grandmother        breast  . Breast cancer Paternal Grandmother   . Stroke Paternal Grandfather        cerebral aneurysm caused the stroke  . Aneurysm Paternal Grandfather   . Stroke Maternal Grandmother   . Diabetes Neg Hx   . Heart disease Neg Hx     Social History   Socioeconomic History  . Marital status: Married    Spouse name: Vincenza Hews  . Number of children: 2  . Years of education: 16  . Highest education level: Bachelor's degree (e.g., BA, AB, BS)  Occupational History  . Not on file  Tobacco Use  . Smoking status: Never Smoker  . Smokeless tobacco: Never Used  Vaping Use  . Vaping Use: Never used  Substance and Sexual Activity  . Alcohol use: Yes    Alcohol/week: 0.0 standard drinks    Comment: holidays  . Drug use: No  . Sexual activity: Yes  Birth control/protection: I.U.D.  Other Topics Concern  . Not on file  Social History Narrative  . Not on file   Social Determinants of Health   Financial Resource Strain:   . Difficulty of Paying Living Expenses: Not on file  Food Insecurity:   . Worried About Programme researcher, broadcasting/film/video in the Last Year: Not on file  . Ran Out of Food in the Last Year: Not on file  Transportation Needs:   . Lack of Transportation (Medical): Not on file  . Lack of Transportation (Non-Medical): Not on file  Physical Activity:   . Days of Exercise per Week: Not on file  .  Minutes of Exercise per Session: Not on file  Stress:   . Feeling of Stress : Not on file  Social Connections:   . Frequency of Communication with Friends and Family: Not on file  . Frequency of Social Gatherings with Friends and Family: Not on file  . Attends Religious Services: Not on file  . Active Member of Clubs or Organizations: Not on file  . Attends Banker Meetings: Not on file  . Marital Status: Not on file  Intimate Partner Violence:   . Fear of Current or Ex-Partner: Not on file  . Emotionally Abused: Not on file  . Physically Abused: Not on file  . Sexually Abused: Not on file    ROS Review of Systems  Constitutional: Negative for activity change, appetite change, diaphoresis, fatigue and fever.  Respiratory: Negative for cough, chest tightness and shortness of breath.   Cardiovascular: Negative for chest pain, palpitations and leg swelling.  Gastrointestinal: Negative.   Neurological: Negative.   Psychiatric/Behavioral: Negative.     Objective:   Today's Vitals: BP 122/87 (BP Location: Left Arm, Patient Position: Sitting, Cuff Size: Normal)   Pulse 93   Temp 98.3 F (36.8 C) (Oral)   Resp 16   Ht 5\' 8"  (1.727 m)   Wt 183 lb (83 kg)   SpO2 98%   BMI 27.83 kg/m   Physical Exam Vitals and nursing note reviewed.  Constitutional:      General: She is awake. She is not in acute distress.    Appearance: She is well-developed, well-groomed and overweight. She is not ill-appearing.  HENT:     Head: Normocephalic.     Right Ear: Hearing normal.     Left Ear: Hearing normal.  Eyes:     General: Lids are normal.        Right eye: No discharge.        Left eye: No discharge.     Conjunctiva/sclera: Conjunctivae normal.     Pupils: Pupils are equal, round, and reactive to light.  Neck:     Thyroid: No thyromegaly.     Vascular: No carotid bruit.  Cardiovascular:     Rate and Rhythm: Normal rate and regular rhythm.     Heart sounds: Normal heart  sounds. No murmur heard.  No gallop.      Comments: Intermittent skipped beats with auscultation. Pulmonary:     Effort: Pulmonary effort is normal. No accessory muscle usage or respiratory distress.     Breath sounds: Normal breath sounds.  Abdominal:     General: Bowel sounds are normal.     Palpations: Abdomen is soft. There is no hepatomegaly or splenomegaly.     Tenderness: There is no abdominal tenderness.  Genitourinary:    Rectum: External hemorrhoid present. No anal fissure.    Musculoskeletal:  Cervical back: Normal range of motion and neck supple.     Right lower leg: No edema.     Left lower leg: No edema.  Skin:    General: Skin is warm and dry.  Neurological:     Mental Status: She is alert and oriented to person, place, and time.  Psychiatric:        Attention and Perception: Attention normal.        Mood and Affect: Mood normal.        Speech: Speech normal.        Behavior: Behavior normal. Behavior is cooperative.        Thought Content: Thought content normal.     Assessment & Plan:   Problem List Items Addressed This Visit      Cardiovascular and Mediastinum   First degree hemorrhoids    Ongoing issue, currently not inflamed or irritated.  Recommend to continue at home OTC regimen and monitor.  If worsening symptoms return to office.      Frequent unifocal PVCs    Followed by cardiology at this time.  Asymptomatic at this time.  Did not tolerate beta blocker therapy.  Continue to monitor and continue collaboration with cardiology.        Musculoskeletal and Integument   Eczema    Chronic, ongoing.  Continue current medication regimen and adjust as needed.  Refills sent.        Other   Constipation    Ongoing, currently benefiting from Miralax.  Suspect underlying cause of irritation to hemorrhoids. Continue Miralax and Colace at this time.  Suspect some underlying IBS, more constipation.  If ongoing issues or worsening could trial Linzess.   Provided information to patient on this.       Other Visit Diagnoses    Encounter to establish care    -  Primary      Outpatient Encounter Medications as of 08/18/2020  Medication Sig  . Multiple Vitamin (MULTIVITAMIN) tablet Take 1 tablet by mouth daily.  . polyethylene glycol powder (GLYCOLAX/MIRALAX) 17 GM/SCOOP powder Take 17 g by mouth daily.  Marland Kitchen triamcinolone (KENALOG) 0.025 % cream Apply 1 application topically 2 (two) times daily.  . [DISCONTINUED] triamcinolone (KENALOG) 0.025 % cream Apply 1 application topically 2 (two) times daily.  Marland Kitchen levonorgestrel (MIRENA) 20 MCG/24HR IUD by Intrauterine route once for 1 dose.  . [DISCONTINUED] neomycin-polymyxin-hydrocortisone (CORTISPORIN) 3.5-10000-1 OTIC suspension Place 4 drops into the left ear 3 (three) times daily. (Patient not taking: Reported on 08/18/2020)   No facility-administered encounter medications on file as of 08/18/2020.    Follow-up: Return in about 9 months (around 05/18/2021) for Annual physical is due after 05/08/2021.   Marjie Skiff, NP

## 2021-02-05 ENCOUNTER — Ambulatory Visit (INDEPENDENT_AMBULATORY_CARE_PROVIDER_SITE_OTHER): Payer: BC Managed Care – PPO | Admitting: Cardiology

## 2021-02-05 ENCOUNTER — Encounter: Payer: Self-pay | Admitting: Cardiology

## 2021-02-05 ENCOUNTER — Other Ambulatory Visit: Payer: Self-pay

## 2021-02-05 VITALS — BP 122/80 | HR 73 | Ht 68.5 in | Wt 178.0 lb

## 2021-02-05 DIAGNOSIS — I493 Ventricular premature depolarization: Secondary | ICD-10-CM | POA: Diagnosis not present

## 2021-02-05 NOTE — Progress Notes (Signed)
Cardiology Office Note:    Date:  02/05/2021   ID:  Elizabeth Case, DOB [birth year], MRN [id number]  PCP:  Marjie Skiff, NP  [facility] Cardiologist:  Debbe Odea, MD  [facility] Electrophysiologist:  None   Referring MD: Marjie Skiff, NP   Chief Complaint  Patient presents with  . Other    6 month follow up. Meds reviewed verbally with patient.     History of Present Illness:    Elizabeth Case is a 50 y.o. female with a hx of GERD, frequent PVCs who presents for follow-up.    Previously seen for irregular heartbeats, cardiac monitor showed frequent PVCs 15% burden.  She is asymptomatic.  Toprol-XL initially tried, but patient became dizzy and fatigued.  Beta-blocker was stopped with resolution of symptoms.  She currently feels okay, has no concerns at this time.  Prior notes Echocardiogram 07/2020 normal systolic and diastolic function, EF 55 to 60%. Cardiac monitor: 06/2020 frequent PVCs, 15% PVC burden.   Past Medical History:  Diagnosis Date  . Allergic rhinitis   . Eczema   . GERD (gastroesophageal reflux disease)   . Migraines    1x/mo  . Vitamin D deficiency disease     Past Surgical History:  Procedure Laterality Date  . BREAST BIOPSY    . BREAST EXCISIONAL BIOPSY Right   . BREAST REDUCTION SURGERY  1994  . BREAST SURGERY Right 2012   lumpectomy  . CHOLECYSTECTOMY  1998  . COLONOSCOPY WITH PROPOFOL N/A 09/08/2016   Procedure: COLONOSCOPY WITH PROPOFOL;  Surgeon: Wyline Mood, MD;  Location: [facility];  Service: Endoscopy;  Laterality: N/A;  Latex sensitivity  . REDUCTION MAMMAPLASTY Bilateral 1994    Current Medications: Current Meds  Medication Sig  . levonorgestrel (MIRENA) 20 MCG/24HR IUD by Intrauterine route once for 1 dose.  . Multiple Vitamin (MULTIVITAMIN) tablet Take 1 tablet by mouth daily.  . polyethylene glycol powder (GLYCOLAX/MIRALAX) 17 GM/SCOOP powder Take 17 g by mouth daily.  Marland Kitchen triamcinolone (KENALOG)  0.025 % cream Apply 1 application topically 2 (two) times daily.     Allergies:   Kiwi extract, Latex, and Other   Social History   Socioeconomic History  . Marital status: Married    Spouse name: Vincenza Hews  . Number of children: 2  . Years of education: 16  . Highest education level: Bachelor's degree (e.g., BA, AB, BS)  Occupational History  . Not on file  Tobacco Use  . Smoking status: Never Smoker  . Smokeless tobacco: Never Used  Vaping Use  . Vaping Use: Never used  Substance and Sexual Activity  . Alcohol use: Yes    Alcohol/week: 0.0 standard drinks    Comment: holidays  . Drug use: No  . Sexual activity: Yes    Birth control/protection: I.U.D.  Other Topics Concern  . Not on file  Social History Narrative  . Not on file   Social Determinants of Health   Financial Resource Strain: Not on file  Food Insecurity: Not on file  Transportation Needs: Not on file  Physical Activity: Not on file  Stress: Not on file  Social Connections: Not on file     Family History: The patient's family history includes Aneurysm in her paternal grandfather; Breast cancer in her paternal grandmother; Cancer in her paternal grandmother; Cancer (age of onset: 19) in her father; Hypertension in her father and mother; Pulmonary fibrosis in her mother; Stroke in her maternal grandmother and paternal grandfather. There is  no history of Diabetes or Heart disease.  ROS:   Please see the history of present illness.     All other systems reviewed and are negative.  EKGs/Labs/Other Studies Reviewed:    The following studies were reviewed today:   EKG:  EKG is  ordered today.  The ekg ordered today demonstrates sinus rhythm, frequent PVC's.  Recent Labs: 05/08/2020: ALT 11; BUN 8; Creat 0.63; Hemoglobin 15.0; Platelets 245; Potassium 3.9; Sodium 138; TSH 2.02  Recent Lipid Panel    Component Value Date/Time   CHOL 144 05/08/2020 1533   TRIG 70 05/08/2020 1533   HDL 42 (L) 05/08/2020 1533    CHOLHDL 3.4 05/08/2020 1533   VLDL 20 02/08/2017 1418   LDLCALC 86 05/08/2020 1533    Physical Exam:    VS:  BP 122/80 (BP Location: Left Arm, Patient Position: Sitting, Cuff Size: Normal)   Pulse 73   Ht 5' 8.5" (1.74 m)   Wt 178 lb (80.7 kg)   BMI 26.67 kg/m     Wt Readings from Last 3 Encounters:  02/05/21 178 lb (80.7 kg)  08/18/20 183 lb (83 kg)  08/07/20 183 lb 2 oz (83.1 kg)     GEN:  Well nourished, well developed in no acute distress HEENT: Normal NECK: No JVD; No carotid bruits LYMPHATICS: No lymphadenopathy CARDIAC: RRR, occasional skipped beats, no murmurs, rubs, gallops RESPIRATORY:  Clear to auscultation without rales, wheezing or rhonchi  ABDOMEN: Soft, non-tender, non-distended MUSCULOSKELETAL:  No edema; No deformity  SKIN: Warm and dry NEUROLOGIC:  Alert and oriented x 3 PSYCHIATRIC:  Normal affect   ASSESSMENT:    1. Frequent unifocal PVCs    PLAN:    In order of problems listed above:  1. History of frequent PVCs, LBBB pattern, 5% burden.  Last echo with normal systolic and diastolic function, EF 55 to 60%.  Patient asymptomatic.  Previously intolerant to beta-blocker/Toprol-XL with symptoms of dizziness.  Beta-blocker stopped.  Currently asymptomatic.  Plan for repeat echocardiogram if patient becomes symptomatic.  Management options including ablation previously discussed with patient.  We will continue to monitor patient.   Follow-up in 12 months.  Total encounter time 30 minutes  Greater than 50% was spent in counseling and coordination of care with the patient   This note was generated in part or whole with voice recognition software. Voice recognition is usually quite accurate but there are transcription errors that can and very often do occur. I apologize for any typographical errors that were not detected and corrected.  Medication Adjustments/Labs and Tests Ordered: Current medicines are reviewed at length with the patient today.   Concerns regarding medicines are outlined above.  Orders Placed This Encounter  Procedures  . EKG 12-Lead   No orders of the defined types were placed in this encounter.   Patient Instructions  Medication Instructions:  Your physician recommends that you continue on your current medications as directed. Please refer to the Current Medication list given to you today.  *If you need a refill on your cardiac medications before your next appointment, please call your pharmacy*   Lab Work: None ordered If you have labs (blood work) drawn today and your tests are completely normal, you will receive your results only by: Marland Kitchen MyChart Message (if you have MyChart) OR . A paper copy in the mail If you have any lab test that is abnormal or we need to change your treatment, we will call you to review the results.  Testing/Procedures: None ordered   Follow-Up: At [facility], you and your health needs are our priority.  As part of our continuing mission to provide you with exceptional heart care, we have created designated Provider Care Teams.  These Care Teams include your primary Cardiologist (physician) and Advanced Practice Providers (APPs -  Physician Assistants and Nurse Practitioners) who all work together to provide you with the care you need, when you need it.  We recommend signing up for the patient portal called "MyChart".  Sign up information is provided on this After Visit Summary.  MyChart is used to connect with patients for Virtual Visits (Telemedicine).  Patients are able to view lab/test results, encounter notes, upcoming appointments, etc.  Non-urgent messages can be sent to your provider as well.   To learn more about what you can do with MyChart, go to [email or web address].    Your next appointment:   1 year(s)  The format for your next appointment:   In Person  Provider:   Debbe Odea, MD   Other Instructions      Signed, Debbe Odea, MD   02/05/2021 12:29 PM    [facility]

## 2021-02-05 NOTE — Patient Instructions (Signed)

## 2021-03-31 ENCOUNTER — Telehealth: Payer: Self-pay

## 2021-03-31 NOTE — Telephone Encounter (Signed)
Copied from CRM [id number]. Topic: Appointment Scheduling - Scheduling Inquiry for Clinic >> Mar 31, 2021  3:06 PM Daphine Deutscher D wrote: Reason for CRM: Pt called saying she might have a YTI and needs to be seen asap.  I seen Lauren CB#  [phone] have some appts but they are locked.  CB#  [phone]   LVm to make this apt.

## 2021-04-01 ENCOUNTER — Other Ambulatory Visit: Payer: Self-pay

## 2021-04-01 ENCOUNTER — Encounter: Payer: Self-pay | Admitting: Nurse Practitioner

## 2021-04-01 ENCOUNTER — Ambulatory Visit: Payer: BC Managed Care – PPO | Admitting: Nurse Practitioner

## 2021-04-01 VITALS — BP 128/76 | HR 79 | Temp 98.5°F | Ht 68.03 in | Wt 177.6 lb

## 2021-04-01 DIAGNOSIS — N3001 Acute cystitis with hematuria: Secondary | ICD-10-CM | POA: Diagnosis not present

## 2021-04-01 DIAGNOSIS — R300 Dysuria: Secondary | ICD-10-CM

## 2021-04-01 LAB — URINALYSIS, ROUTINE W REFLEX MICROSCOPIC
Bilirubin, UA: NEGATIVE
Glucose, UA: NEGATIVE
Ketones, UA: NEGATIVE
Nitrite, UA: NEGATIVE
Protein,UA: NEGATIVE
Specific Gravity, UA: 1.01 (ref 1.005–1.030)
Urobilinogen, Ur: 0.2 mg/dL (ref 0.2–1.0)
pH, UA: 6.5 (ref 5.0–7.5)

## 2021-04-01 LAB — MICROSCOPIC EXAMINATION

## 2021-04-01 LAB — WET PREP FOR TRICH, YEAST, CLUE
Clue Cell Exam: NEGATIVE
Trichomonas Exam: NEGATIVE
Yeast Exam: NEGATIVE

## 2021-04-01 MED ORDER — FLUCONAZOLE 150 MG PO TABS: 150.0000 mg | ORAL_TABLET | Freq: Once | ORAL | 0 refills | Status: AC

## 2021-04-01 MED ORDER — NITROFURANTOIN MONOHYD MACRO 100 MG PO CAPS: 100.0000 mg | ORAL_CAPSULE | Freq: Two times a day (BID) | ORAL | 0 refills | Status: DC

## 2021-04-01 NOTE — Progress Notes (Signed)
Acute Office Visit  Subjective:    Patient ID: Elizabeth Case, female    DOB: [birth year], 50 y.o.   MRN: [id number]  Chief Complaint  Patient presents with  . Dysuria    Started Sunday  . Back Pain    HPI Patient is in today for dysuria for 3 days.  URINARY SYMPTOMS  Dysuria: burning Urinary frequency: yes Urgency: no Small volume voids: no Symptom severity: mild Urinary incontinence: no Foul odor: yes Hematuria: no Abdominal pain: no Back pain: yes Suprapubic pain/pressure: yes Flank pain: yes Fever:  no Vomiting: no Relief with cranberry juice: no Relief with pyridium: no Status: worse Previous urinary tract infection: yes Recurrent urinary tract infection: no Sexual activity: monogomous History of sexually transmitted disease: no Treatments attempted: increasing fluids    Past Medical History:  Diagnosis Date  . Allergic rhinitis   . Eczema   . GERD (gastroesophageal reflux disease)   . Migraines    1x/mo  . Vitamin D deficiency disease     Past Surgical History:  Procedure Laterality Date  . BREAST BIOPSY    . BREAST EXCISIONAL BIOPSY Right   . BREAST REDUCTION SURGERY  1994  . BREAST SURGERY Right 2012   lumpectomy  . CHOLECYSTECTOMY  1998  . COLONOSCOPY WITH PROPOFOL N/A 09/08/2016   Procedure: COLONOSCOPY WITH PROPOFOL;  Surgeon: Wyline Mood, MD;  Location: [facility];  Service: Endoscopy;  Laterality: N/A;  Latex sensitivity  . REDUCTION MAMMAPLASTY Bilateral 1994    Family History  Problem Relation Age of Onset  . Hypertension Mother   . Pulmonary fibrosis Mother   . Hypertension Father   . Cancer Father 33       prostate   . Cancer Paternal Grandmother        breast  . Breast cancer Paternal Grandmother   . Stroke Paternal Grandfather        cerebral aneurysm caused the stroke  . Aneurysm Paternal Grandfather   . Stroke Maternal Grandmother   . Diabetes Neg Hx   . Heart disease Neg Hx     Social History    Socioeconomic History  . Marital status: Married    Spouse name: Vincenza Hews  . Number of children: 2  . Years of education: 16  . Highest education level: Bachelor's degree (e.g., BA, AB, BS)  Occupational History  . Not on file  Tobacco Use  . Smoking status: Never Smoker  . Smokeless tobacco: Never Used  Vaping Use  . Vaping Use: Never used  Substance and Sexual Activity  . Alcohol use: Yes    Alcohol/week: 0.0 standard drinks    Comment: holidays  . Drug use: No  . Sexual activity: Yes    Birth control/protection: I.U.D.  Other Topics Concern  . Not on file  Social History Narrative  . Not on file   Social Determinants of Health   Financial Resource Strain: Not on file  Food Insecurity: Not on file  Transportation Needs: Not on file  Physical Activity: Not on file  Stress: Not on file  Social Connections: Not on file  Intimate Partner Violence: Not on file    Outpatient Medications Prior to Visit  Medication Sig Dispense Refill  . levonorgestrel (MIRENA) 20 MCG/24HR IUD by Intrauterine route once for 1 dose. 1 each 0  . Multiple Vitamin (MULTIVITAMIN) tablet Take 1 tablet by mouth daily.    . polyethylene glycol powder (GLYCOLAX/MIRALAX) 17 GM/SCOOP powder Take 17 g by mouth daily. 3350  g 1  . triamcinolone (KENALOG) 0.025 % cream Apply 1 application topically 2 (two) times daily. 30 g 5   No facility-administered medications prior to visit.    Allergies  Allergen Reactions  . Kiwi Extract Shortness Of Breath  . Latex Dermatitis  . Other Swelling    Walnuts - angioedema    Review of Systems  Respiratory: Negative.   Cardiovascular: Negative.   Gastrointestinal: Negative.   Genitourinary: Positive for dysuria, flank pain and frequency.  Musculoskeletal: Positive for back pain.  Skin: Negative.   Neurological: Negative.        Objective:    Physical Exam Vitals and nursing note reviewed.  Constitutional:      General: She is not in acute  distress.    Appearance: Normal appearance.  HENT:     Head: Normocephalic.  Eyes:     Conjunctiva/sclera: Conjunctivae normal.  Cardiovascular:     Rate and Rhythm: Normal rate and regular rhythm.     Pulses: Normal pulses.     Heart sounds: Normal heart sounds.  Pulmonary:     Effort: Pulmonary effort is normal.     Breath sounds: Normal breath sounds.  Abdominal:     Palpations: Abdomen is soft.     Tenderness: There is abdominal tenderness (slight pressure over suprapubic region). There is no right CVA tenderness or left CVA tenderness.  Musculoskeletal:     Cervical back: Normal range of motion.  Skin:    General: Skin is warm.  Neurological:     General: No focal deficit present.     Mental Status: She is alert and oriented to person, place, and time.  Psychiatric:        Mood and Affect: Mood normal.        Behavior: Behavior normal.        Thought Content: Thought content normal.        Judgment: Judgment normal.     BP 128/76   Pulse 79   Temp 98.5 F (36.9 C)   Ht 5' 8.03" (1.728 m)   Wt 177 lb 9.6 oz (80.6 kg)   SpO2 96%   BMI 26.98 kg/m  Wt Readings from Last 3 Encounters:  04/01/21 177 lb 9.6 oz (80.6 kg)  02/05/21 178 lb (80.7 kg)  08/18/20 183 lb (83 kg)    Health Maintenance Due  Topic Date Due  . MAMMOGRAM  03/01/2019  . COVID-19 Vaccine (3 - Booster for Pfizer series) 06/24/2020    There are no preventive care reminders to display for this patient.   Lab Results  Component Value Date   TSH 2.02 05/08/2020   Lab Results  Component Value Date   WBC 7.4 05/08/2020   HGB 15.0 05/08/2020   HCT 45.2 (H) 05/08/2020   MCV 91.1 05/08/2020   PLT 245 05/08/2020   Lab Results  Component Value Date   NA 138 05/08/2020   K 3.9 05/08/2020   CO2 26 05/08/2020   GLUCOSE 84 05/08/2020   BUN 8 05/08/2020   CREATININE 0.63 05/08/2020   BILITOT 0.5 05/08/2020   ALKPHOS 71 02/08/2017   AST 13 05/08/2020   ALT 11 05/08/2020   PROT 6.7  05/08/2020   ALBUMIN 4.5 02/08/2017   CALCIUM 9.3 05/08/2020   Lab Results  Component Value Date   CHOL 144 05/08/2020   Lab Results  Component Value Date   HDL 42 (L) 05/08/2020   Lab Results  Component Value Date   LDLCALC 86 05/08/2020  Lab Results  Component Value Date   TRIG 70 05/08/2020   Lab Results  Component Value Date   CHOLHDL 3.4 05/08/2020   No results found for: HGBA1C     Assessment & Plan:   Problem List Items Addressed This Visit   None   Visit Diagnoses    Dysuria    -  Primary   U/A positive for UTI, wet prep negative   Relevant Orders   Urinalysis, Routine w reflex microscopic   WET PREP FOR TRICH, YEAST, CLUE   Urine Culture   Acute cystitis with hematuria       Will treat with macrobid and diflucan. Encouraged plenty of fluids and can try cranberry juice. F/U if symptoms worsen or don't improve.       Meds ordered this encounter  Medications  . nitrofurantoin, macrocrystal-monohydrate, (MACROBID) 100 MG capsule    Sig: Take 1 capsule (100 mg total) by mouth 2 (two) times daily.    Dispense:  10 capsule    Refill:  0  . fluconazole (DIFLUCAN) 150 MG tablet    Sig: Take 1 tablet (150 mg total) by mouth once for 1 dose.    Dispense:  1 tablet    Refill:  0     Gerre Scull, NP

## 2021-04-01 NOTE — Patient Instructions (Signed)
Urinary Tract Infection, Adult  A urinary tract infection (UTI) is an infection of any part of the urinary tract. The urinary tract includes the kidneys, ureters, bladder, and urethra. These organs make, store, and get rid of urine in the body. An upper UTI affects the ureters and kidneys. A lower UTI affects the bladder and urethra. What are the causes? Most urinary tract infections are caused by bacteria in your genital area around your urethra, where urine leaves your body. These bacteria grow and cause inflammation of your urinary tract. What increases the risk? You are more likely to develop this condition if:  You have a urinary catheter that stays in place.  You are not able to control when you urinate or have a bowel movement (incontinence).  You are female and you: ? Use a spermicide or diaphragm for birth control. ? Have low estrogen levels. ? Are pregnant.  You have certain genes that increase your risk.  You are sexually active.  You take antibiotic medicines.  You have a condition that causes your flow of urine to slow down, such as: ? An enlarged prostate, if you are female. ? Blockage in your urethra. ? A kidney stone. ? A nerve condition that affects your bladder control (neurogenic bladder). ? Not getting enough to drink, or not urinating often.  You have certain medical conditions, such as: ? Diabetes. ? A weak disease-fighting system (immunesystem). ? Sickle cell disease. ? Gout. ? Spinal cord injury. What are the signs or symptoms? Symptoms of this condition include:  Needing to urinate right away (urgency).  Frequent urination. This may include small amounts of urine each time you urinate.  Pain or burning with urination.  Blood in the urine.  Urine that smells bad or unusual.  Trouble urinating.  Cloudy urine.  Vaginal discharge, if you are female.  Pain in the abdomen or the lower back. You may also have:  Vomiting or a decreased  appetite.  Confusion.  Irritability or tiredness.  A fever or chills.  Diarrhea. The first symptom in older adults may be confusion. In some cases, they may not have any symptoms until the infection has worsened. How is this diagnosed? This condition is diagnosed based on your medical history and a physical exam. You may also have other tests, including:  Urine tests.  Blood tests.  Tests for STIs (sexually transmitted infections). If you have had more than one UTI, a cystoscopy or imaging studies may be done to determine the cause of the infections. How is this treated? Treatment for this condition includes:  Antibiotic medicine.  Over-the-counter medicines to treat discomfort.  Drinking enough water to stay hydrated. If you have frequent infections or have other conditions such as a kidney stone, you may need to see a health care provider who specializes in the urinary tract (urologist). In rare cases, urinary tract infections can cause sepsis. Sepsis is a life-threatening condition that occurs when the body responds to an infection. Sepsis is treated in the hospital with IV antibiotics, fluids, and other medicines. Follow these instructions at home: Medicines  Take over-the-counter and prescription medicines only as told by your health care provider.  If you were prescribed an antibiotic medicine, take it as told by your health care provider. Do not stop using the antibiotic even if you start to feel better. General instructions  Make sure you: ? Empty your bladder often and completely. Do not hold urine for long periods of time. ? Empty your bladder after   sex. ? Wipe from front to back after urinating or having a bowel movement if you are female. Use each tissue only one time when you wipe.  Drink enough fluid to keep your urine pale yellow.  Keep all follow-up visits. This is important.   Contact a health care provider if:  Your symptoms do not get better after 1-2  days.  Your symptoms go away and then return. Get help right away if:  You have severe pain in your back or your lower abdomen.  You have a fever or chills.  You have nausea or vomiting. Summary  A urinary tract infection (UTI) is an infection of any part of the urinary tract, which includes the kidneys, ureters, bladder, and urethra.  Most urinary tract infections are caused by bacteria in your genital area.  Treatment for this condition often includes antibiotic medicines.  If you were prescribed an antibiotic medicine, take it as told by your health care provider. Do not stop using the antibiotic even if you start to feel better.  Keep all follow-up visits. This is important. This information is not intended to replace advice given to you by your health care provider. Make sure you discuss any questions you have with your health care provider. Document Revised: 05/30/2020 Document Reviewed: 05/30/2020 Elsevier Patient Education  2021 [employer]  

## 2021-04-04 LAB — URINE CULTURE

## 2021-05-14 ENCOUNTER — Encounter: Payer: BC Managed Care – PPO | Admitting: Family Medicine

## 2021-05-21 ENCOUNTER — Encounter: Payer: Self-pay | Admitting: Obstetrics and Gynecology

## 2021-05-21 ENCOUNTER — Ambulatory Visit: Payer: BC Managed Care – PPO | Admitting: Obstetrics and Gynecology

## 2021-05-21 ENCOUNTER — Other Ambulatory Visit: Payer: Self-pay

## 2021-05-21 ENCOUNTER — Encounter: Payer: Self-pay | Admitting: Nurse Practitioner

## 2021-05-21 ENCOUNTER — Ambulatory Visit (INDEPENDENT_AMBULATORY_CARE_PROVIDER_SITE_OTHER): Payer: BC Managed Care – PPO | Admitting: Nurse Practitioner

## 2021-05-21 VITALS — BP 124/80 | Ht 68.0 in | Wt 179.0 lb

## 2021-05-21 VITALS — BP 106/64 | HR 61 | Temp 98.1°F | Ht 68.0 in | Wt 179.4 lb

## 2021-05-21 DIAGNOSIS — M545 Low back pain, unspecified: Secondary | ICD-10-CM

## 2021-05-21 DIAGNOSIS — Z30431 Encounter for routine checking of intrauterine contraceptive device: Secondary | ICD-10-CM

## 2021-05-21 DIAGNOSIS — N939 Abnormal uterine and vaginal bleeding, unspecified: Secondary | ICD-10-CM | POA: Diagnosis not present

## 2021-05-21 DIAGNOSIS — Z1231 Encounter for screening mammogram for malignant neoplasm of breast: Secondary | ICD-10-CM | POA: Diagnosis not present

## 2021-05-21 DIAGNOSIS — Z8269 Family history of other diseases of the musculoskeletal system and connective tissue: Secondary | ICD-10-CM | POA: Diagnosis not present

## 2021-05-21 DIAGNOSIS — Z975 Presence of (intrauterine) contraceptive device: Secondary | ICD-10-CM | POA: Diagnosis not present

## 2021-05-21 DIAGNOSIS — I493 Ventricular premature depolarization: Secondary | ICD-10-CM

## 2021-05-21 DIAGNOSIS — Z87440 Personal history of urinary (tract) infections: Secondary | ICD-10-CM | POA: Diagnosis not present

## 2021-05-21 DIAGNOSIS — Z836 Family history of other diseases of the respiratory system: Secondary | ICD-10-CM

## 2021-05-21 DIAGNOSIS — Z0000 Encounter for general adult medical examination without abnormal findings: Secondary | ICD-10-CM | POA: Diagnosis not present

## 2021-05-21 NOTE — Progress Notes (Addendum)
Elizabeth Guarneri T, NP   Chief Complaint  Patient presents with   Menstrual Problem    Spotting everyday since June, last normal cycle was in May   Urinary Tract Infection    Lower back pain, had one without sx in June     HPI:      [name] is a 50 y.o. [id number] whose LMP was Patient's last menstrual period was 03/22/2021 (approximate)., presents today for NP> 3 yrs eval for irregular bleeding, referred by PCP.  Mirena replaced 12/28/17. No menses for the first year, menses Q1-2 months, lasting 3-4 days, light/spotting only, rare BTB, no dysmen. Started with light bleeding 6/22 and it hasn'Case stopped. Light flow, no dysmen/pelvic pain.  She is sex activve, no pain/bleeding. Does have vag dryness, improved with lubricants. Neg pap /neg HPV DNA 4/19.  Pt with low back pain, no other UTI sx. Had UTI 6/22 on C&S and treated with abx, but didn'Case have any sx other than back pain. Would like urine rechecked today. No vag sx.  Past Medical History:  Diagnosis Date   Allergic rhinitis    Eczema    GERD (gastroesophageal reflux disease)    Migraines    1x/mo   Vitamin D deficiency disease     Past Surgical History:  Procedure Laterality Date   BREAST BIOPSY     BREAST EXCISIONAL BIOPSY Right    BREAST REDUCTION SURGERY  1994   BREAST SURGERY Right 2012   lumpectomy   CHOLECYSTECTOMY  1998   COLONOSCOPY WITH PROPOFOL N/A 09/08/2016   Procedure: COLONOSCOPY WITH PROPOFOL;  Surgeon: Jonathon Bellows, MD;  Location: [facility];  Service: Endoscopy;  Laterality: N/A;  Latex sensitivity   REDUCTION MAMMAPLASTY Bilateral 1994    Family History  Problem Relation Age of Onset   Hypertension Mother    Pulmonary fibrosis Mother    Hypertension Father    Cancer Father 38       prostate    Stroke Maternal Grandmother    Cancer Paternal Grandmother        breast   Breast cancer Paternal Grandmother    Stroke Paternal Grandfather        cerebral aneurysm caused the  stroke   Aneurysm Paternal Grandfather    Diabetes Neg Hx    Heart disease Neg Hx     Social History   Socioeconomic History   Marital status: Married    Spouse name: Audelia Acton   Number of children: 2   Years of education: 16   Highest education level: Bachelor's degree (e.g., BA, AB, BS)  Occupational History   Not on file  Tobacco Use   Smoking status: Never   Smokeless tobacco: Never  Vaping Use   Vaping Use: Never used  Substance and Sexual Activity   Alcohol use: Yes    Alcohol/week: 0.0 standard drinks    Comment: holidays   Drug use: No   Sexual activity: Yes    Birth control/protection: I.U.D.    Comment: Mirena  Other Topics Concern   Not on file  Social History Narrative   Not on file   Social Determinants of Health   Financial Resource Strain: Not on file  Food Insecurity: Not on file  Transportation Needs: Not on file  Physical Activity: Not on file  Stress: Not on file  Social Connections: Not on file  Intimate Partner Violence: Not on file    Outpatient Medications Prior to Visit  Medication Sig Dispense  Refill   levonorgestrel (MIRENA) 20 MCG/24HR IUD by Intrauterine route once for 1 dose. 1 each 0   Multiple Vitamin (MULTIVITAMIN) tablet Take 1 tablet by mouth daily.     polyethylene glycol powder (GLYCOLAX/MIRALAX) 17 GM/SCOOP powder Take 17 g by mouth daily. 3350 g 1   triamcinolone (KENALOG) 0.025 % cream Apply 1 application topically 2 (two) times daily. 30 g 5   ID NOW COVID-19 KIT TEST AS DIRECTED TODAY     No facility-administered medications prior to visit.      ROS:  Review of Systems  Constitutional:  Negative for fever.  Gastrointestinal:  Negative for blood in stool, constipation, diarrhea, nausea and vomiting.  Genitourinary:  Positive for menstrual problem. Negative for dyspareunia, dysuria, flank pain, frequency, hematuria, urgency, vaginal bleeding, vaginal discharge and vaginal pain.  Musculoskeletal:  Positive for back  pain.  Skin:  Negative for rash.  BREAST: No symptoms   OBJECTIVE:   Vitals:  BP 124/80   Ht '5\' 8"'  (1.727 m)   Wt 179 lb (81.2 kg)   LMP 03/22/2021 (Approximate)   BMI 27.22 kg/m   Physical Exam Vitals reviewed.  Constitutional:      Appearance: She is well-developed. She is not ill-appearing or toxic-appearing.  Pulmonary:     Effort: Pulmonary effort is normal.  Abdominal:     Tenderness: There is no right CVA tenderness or left CVA tenderness.  Genitourinary:    General: Normal vulva.     Pubic Area: No rash.      Labia:        Right: No rash, tenderness or lesion.        Left: No rash, tenderness or lesion.      Vagina: Bleeding present. No vaginal discharge, erythema or tenderness.     Cervix: Normal.     Uterus: Normal. Not enlarged and not tender.      Adnexa: Right adnexa normal and left adnexa normal.       Right: No mass or tenderness.         Left: No mass or tenderness.       Comments: LIGHT BROWN D/C; IUD STRINGS IN CX OS Musculoskeletal:        General: Normal range of motion.     Cervical back: Normal range of motion.  Skin:    General: Skin is warm and dry.  Neurological:     General: No focal deficit present.     Mental Status: She is alert and oriented to person, place, and time.     Cranial Nerves: No cranial nerve deficit.  Psychiatric:        Mood and Affect: Mood normal.        Behavior: Behavior normal.        Thought Content: Thought content normal.        Judgment: Judgment normal.    Results: Results for orders placed or performed in visit on 05/21/21 (from the past 24 hour(s))  POCT urine pregnancy     Status: Normal   Collection Time: 05/22/21 11:01 AM  Result Value Ref Range   Preg Test, Ur Negative Negative  POCT Urinalysis Dipstick     Status: Normal   Collection Time: 05/22/21 11:02 AM  Result Value Ref Range   Color, UA yellow    Clarity, UA clear    Glucose, UA Negative Negative   Bilirubin, UA neg    Ketones, UA neg     Spec Grav, UA 1.010 1.010 - 1.025  Blood, UA neg    pH, UA 6.5 5.0 - 8.0   Protein, UA Negative Negative   Urobilinogen, UA     Nitrite, UA neg    Leukocytes, UA Negative Negative   Appearance     Odor       Assessment/Plan: Abnormal uterine bleeding (AUB) - Plan: POCT urine pregnancy, neg UPT. For 1 mo with IUD. May now be time for regular menses. F/u if sx persist after this wk for GYN u/s for IUD placement.   Encounter for routine checking of intrauterine contraceptive device (IUD)--IUD strings in cx os.   Acute left-sided low back pain without sciatica - Plan: Urine Culture, POCT Urinalysis Dipstick; no CVAT. Check C&S. Will f/u if pos.   History of UTI - Plan: Urine Culture, POCT Urinalysis Dipstick    Return if symptoms worsen or fail to improve.  Scherry Laverne B. Kaleigha Chamberlin, PA-C 05/22/2021 11:07 AM

## 2021-05-21 NOTE — Assessment & Plan Note (Signed)
Mother recently passed -- will check CRP, ESR, ANA, Alpha 1 Antitrypsin + CBC, CMP, and TSH.

## 2021-05-21 NOTE — Assessment & Plan Note (Signed)
Mother recently passed -- will check CRP, ESR, ANA, Alpha 1 Antitrypsin.

## 2021-05-21 NOTE — Progress Notes (Signed)
BP 106/64   Pulse 61   Temp 98.1 F (36.7 C) (Oral)   Ht 5' 8" (1.727 m)   Wt 179 lb 6.4 oz (81.4 kg)   SpO2 95%   BMI 27.28 kg/m    Subjective:    Patient ID: Elizabeth Case, female    DOB: 28-Jan-1971, 50 y.o.   MRN: [id number]  HPI: Elizabeth Case is a 50 y.o. female presenting on 05/21/2021 for comprehensive medical examination. Current medical complaints include:none  She currently lives with: husband Menopausal Symptoms: no  Is having menstrual cycles -- has IUD in place and at end June started a cycle and continues to bleed, light flow -- mainly notices when goes to bathroom.  IUD place by [place] on 7/93/9030.  Would like hormone levels checked today.  Her mother just passed, had pulmonary fibrosis and other inflammatory disease processes, including Raynaud.  Mother was not a smoker and did not live with smoker. Was followed by pulmonary for connective tissue disease.  Depression Screen done today and results listed below:  Depression screen [facility] 2/9 05/21/2021 04/01/2021 05/08/2020 05/01/2019 01/11/2019  Decreased Interest 0 0 0 0 0  Down, Depressed, Hopeless 3 0 0 0 0  PHQ - 2 Score 3 0 0 0 0  Altered sleeping 3 - 0 0 0  Tired, decreased energy 3 - 0 0 0  Change in appetite 0 - 0 0 0  Feeling bad or failure about yourself  0 - 0 0 0  Trouble concentrating 0 - 0 0 0  Moving slowly or fidgety/restless 0 - 0 0 0  Suicidal thoughts 0 - 0 0 0  PHQ-9 Score 9 - 0 0 0  Difficult doing work/chores Somewhat difficult - Not difficult at all Not difficult at all Not difficult at all    The patient does not have a history of falls. I did not complete a risk assessment for falls. A plan of care for falls was not documented.   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   Family history of connective tissue  disease in mother 05/21/2021   IUD (intrauterine device) in place 05/21/2021   Family history of pulmonary fibrosis 05/21/2021   Resolved Ambulatory Problems    Diagnosis Date Noted   Epigastric pain 02/24/2015   Vitamin D deficiency disease    Allergic conjunctivitis 09/01/2015   Hemorrhage of rectum and anus 09/01/2016   NSAID long-term use 09/01/2016   Cervical cancer screening 02/08/2017   Elevated red blood cell count 02/09/2017   Preventative health care 02/09/2017   Past Medical History:  Diagnosis Date   GERD (gastroesophageal reflux disease)      Surgical History:  Past Surgical History:  Procedure Laterality Date   BREAST BIOPSY     BREAST EXCISIONAL BIOPSY Right    BREAST REDUCTION SURGERY  1994   BREAST SURGERY Right 2012   lumpectomy   CHOLECYSTECTOMY  1998   COLONOSCOPY WITH PROPOFOL N/A 09/08/2016   Procedure: COLONOSCOPY WITH PROPOFOL;  Surgeon: Jonathon Bellows, MD;  Location: [facility];  Service: Endoscopy;  Laterality: N/A;  Latex sensitivity   REDUCTION MAMMAPLASTY Bilateral 1994    Medications:  Current Outpatient Medications on File Prior to Visit  Medication Sig   ID NOW COVID-19 KIT TEST  AS DIRECTED TODAY   levonorgestrel (MIRENA) 20 MCG/24HR IUD by Intrauterine route once for 1 dose.   Multiple Vitamin (MULTIVITAMIN) tablet Take 1 tablet by mouth daily.   polyethylene glycol powder (GLYCOLAX/MIRALAX) 17 GM/SCOOP powder Take 17 g by mouth daily.   triamcinolone (KENALOG) 0.025 % cream Apply 1 application topically 2 (two) times daily.   No current facility-administered medications on file prior to visit.    Allergies:  Allergies  Allergen Reactions   Kiwi Extract Shortness Of Breath   Latex Dermatitis   Other Swelling    Walnuts - angioedema    Social History:  Social History   Socioeconomic History   Marital status: Married    Spouse name: Audelia Acton   Number of children: 2   Years of education: 16   Highest education level:  Bachelor's degree (e.g., BA, AB, BS)  Occupational History   Not on file  Tobacco Use   Smoking status: Never   Smokeless tobacco: Never  Vaping Use   Vaping Use: Never used  Substance and Sexual Activity   Alcohol use: Yes    Alcohol/week: 0.0 standard drinks    Comment: holidays   Drug use: No   Sexual activity: Yes    Birth control/protection: I.U.D.  Other Topics Concern   Not on file  Social History Narrative   Not on file   Social Determinants of Health   Financial Resource Strain: Not on file  Food Insecurity: Not on file  Transportation Needs: Not on file  Physical Activity: Not on file  Stress: Not on file  Social Connections: Not on file  Intimate Partner Violence: Not on file   Social History   Tobacco Use  Smoking Status Never  Smokeless Tobacco Never   Social History   Substance and Sexual Activity  Alcohol Use Yes   Alcohol/week: 0.0 standard drinks   Comment: holidays    Family History:  Family History  Problem Relation Age of Onset   Hypertension Mother    Pulmonary fibrosis Mother    Hypertension Father    Cancer Father 54       prostate    Stroke Maternal Grandmother    Cancer Paternal Grandmother        breast   Breast cancer Paternal Grandmother    Stroke Paternal Grandfather        cerebral aneurysm caused the stroke   Aneurysm Paternal Grandfather    Diabetes Neg Hx    Heart disease Neg Hx     Past medical history, surgical history, medications, allergies, family history and social history reviewed with patient today and changes made to appropriate areas of the chart.   ROS All other ROS negative except what is listed above and in the HPI.      Objective:    BP 106/64   Pulse 61   Temp 98.1 F (36.7 C) (Oral)   Ht 5' 8" (1.727 m)   Wt 179 lb 6.4 oz (81.4 kg)   SpO2 95%   BMI 27.28 kg/m   Wt Readings from Last 3 Encounters:  05/21/21 179 lb 6.4 oz (81.4 kg)  04/01/21 177 lb 9.6 oz (80.6 kg)  02/05/21 178 lb (80.7  kg)    Physical Exam Vitals and nursing note reviewed. Exam conducted with a chaperone present.  Constitutional:      General: She is awake. She is not in acute distress.    Appearance: She is well-developed and well-groomed. She is not ill-appearing or toxic-appearing.  HENT:     Head: Normocephalic and atraumatic.     Right Ear: Hearing, tympanic membrane, ear canal and external ear normal. No drainage.     Left Ear: Hearing, tympanic membrane, ear canal and external ear normal. No drainage.     Nose: Nose normal.     Right Sinus: No maxillary sinus tenderness or frontal sinus tenderness.     Left Sinus: No maxillary sinus tenderness or frontal sinus tenderness.     Mouth/Throat:     Mouth: Mucous membranes are moist.     Pharynx: Oropharynx is clear. Uvula midline. No pharyngeal swelling, oropharyngeal exudate or posterior oropharyngeal erythema.  Eyes:     General: Lids are normal.        Right eye: No discharge.        Left eye: No discharge.     Extraocular Movements: Extraocular movements intact.     Conjunctiva/sclera: Conjunctivae normal.     Pupils: Pupils are equal, round, and reactive to light.     Visual Fields: Right eye visual fields normal and left eye visual fields normal.  Neck:     Thyroid: No thyromegaly.     Vascular: No carotid bruit.     Trachea: Trachea normal.  Cardiovascular:     Rate and Rhythm: Normal rate and regular rhythm.     Heart sounds: Normal heart sounds. No murmur heard.   No gallop.  Pulmonary:     Effort: Pulmonary effort is normal. No accessory muscle usage or respiratory distress.     Breath sounds: Normal breath sounds.  Chest:  Breasts:    Right: Normal. No axillary adenopathy or supraclavicular adenopathy.     Left: Normal. No axillary adenopathy or supraclavicular adenopathy.  Abdominal:     General: Bowel sounds are normal.     Palpations: Abdomen is soft. There is no hepatomegaly or splenomegaly.     Tenderness: There is no  abdominal tenderness.  Musculoskeletal:        General: Normal range of motion.     Cervical back: Normal range of motion and neck supple.     Right lower leg: No edema.     Left lower leg: No edema.  Lymphadenopathy:     Head:     Right side of head: No submental, submandibular, tonsillar, preauricular or posterior auricular adenopathy.     Left side of head: No submental, submandibular, tonsillar, preauricular or posterior auricular adenopathy.     Cervical: No cervical adenopathy.     Upper Body:     Right upper body: No supraclavicular, axillary or pectoral adenopathy.     Left upper body: No supraclavicular, axillary or pectoral adenopathy.  Skin:    General: Skin is warm and dry.     Capillary Refill: Capillary refill takes less than 2 seconds.     Findings: No rash.  Neurological:     Mental Status: She is alert and oriented to person, place, and time.     Cranial Nerves: Cranial nerves are intact.     Gait: Gait is intact.     Deep Tendon Reflexes: Reflexes are normal and symmetric.     Reflex Scores:      Brachioradialis reflexes are 2+ on the right side and 2+ on the left side.      Patellar reflexes are 2+ on the right side and 2+ on the left side. Psychiatric:        Attention and Perception: Attention normal.  Mood and Affect: Mood normal.        Speech: Speech normal.        Behavior: Behavior normal. Behavior is cooperative.        Thought Content: Thought content normal.        Judgment: Judgment normal.    Results for orders placed or performed in visit on 04/01/21  WET PREP FOR [place], YEAST, CLUE   Specimen: Sterile Swab   Sterile Swab  Result Value Ref Range   Trichomonas Exam Negative Negative   Yeast Exam Negative Negative   Clue Cell Exam Negative Negative  Urine Culture   Specimen: Urine   UR  Result Value Ref Range   Urine Culture, Routine Final report (A)    Organism ID, Bacteria Escherichia coli (A)    Antimicrobial Susceptibility  Comment   Microscopic Examination   Urine  Result Value Ref Range   WBC, UA 11-30 (A) 0 - 5 /hpf   RBC 3-10 (A) 0 - 2 /hpf   Epithelial Cells (non renal) 0-10 0 - 10 /hpf   Bacteria, UA Many (A) None seen/Few  Urinalysis, Routine w reflex microscopic  Result Value Ref Range   Specific Gravity, UA 1.010 1.005 - 1.030   pH, UA 6.5 5.0 - 7.5   Color, UA Yellow Yellow   Appearance Ur Cloudy (A) Clear   Leukocytes,UA 2+ (A) Negative   Protein,UA Negative Negative/Trace   Glucose, UA Negative Negative   Ketones, UA Negative Negative   RBC, UA 2+ (A) Negative   Bilirubin, UA Negative Negative   Urobilinogen, Ur 0.2 0.2 - 1.0 mg/dL   Nitrite, UA Negative Negative   Microscopic Examination See below:       Assessment & Plan:   Problem List Items Addressed This Visit       Cardiovascular and Mediastinum   Frequent unifocal PVCs - Primary    Followed by cardiology at this time.  Asymptomatic.  Did not tolerate beta blocker therapy.  Continue to monitor and continue collaboration with cardiology.  Lipid panel and TSH today.       Relevant Orders   CBC with Differential/Platelet   Comprehensive metabolic panel   Lipid Panel w/o Chol/HDL Ratio   TSH     Other   Family history of connective tissue disease in mother    Mother recently passed -- will check CRP, ESR, ANA, Alpha 1 Antitrypsin + CBC, CMP, and TSH.       Relevant Orders   CBC with Differential/Platelet   Comprehensive metabolic panel   C-reactive protein   Sed Rate (ESR)   ANA w/Reflex if Positive   Alpha-1-Antitrypsin Deficiency   IUD (intrauterine device) in place    Referral back to GYN for check.  Patient would like hormone levels checked: FSH and LH ordered.       Relevant Orders   FSH/LH   Ambulatory referral to Gynecology   Family history of pulmonary fibrosis    Mother recently passed -- will check CRP, ESR, ANA, Alpha 1 Antitrypsin.         Relevant Orders   CBC with Differential/Platelet    Comprehensive metabolic panel   C-reactive protein   Sed Rate (ESR)   ANA w/Reflex if Positive   Alpha-1-Antitrypsin Deficiency   Other Visit Diagnoses     Encounter for screening mammogram for malignant neoplasm of breast       Mammogram ordered and instructed on where to obtain.   Relevant Orders  MM 3D SCREEN BREAST BILATERAL   Encounter for annual physical exam       Annual physical today with labs and health maintenance reviewed.        Follow up plan: Return in about 1 year (around 05/21/2022) for Annual physical.   LABORATORY TESTING:  - Pap smear: up to date  IMMUNIZATIONS:   - Tdap: Tetanus vaccination status reviewed: last tetanus booster within 10 years. - Influenza: Up to date - Pneumovax: Not applicable - Prevnar: Not applicable - HPV: Not applicable - Zostavax vaccine: Not applicable  SCREENING: -Mammogram: Ordered today  - Colonoscopy: Up to date  - Bone Density: not applicable -Hearing Test: not applicable -Spirometry: Not applicable   PATIENT COUNSELING:   Advised to take 1 mg of folate supplement per day if capable of pregnancy.   Sexuality: Discussed sexually transmitted diseases, partner selection, use of condoms, avoidance of unintended pregnancy  and contraceptive alternatives.   Advised to avoid cigarette smoking.  I discussed with the patient that most people either abstain from alcohol or drink within safe limits (<=14/week and <=4 drinks/occasion for males, <=7/weeks and <= 3 drinks/occasion for females) and that the risk for alcohol disorders and other health effects rises proportionally with the number of drinks per week and how often a drinker exceeds daily limits.  Discussed cessation/primary prevention of drug use and availability of treatment for abuse.   Diet: Encouraged to adjust caloric intake to maintain  or achieve ideal body weight, to reduce intake of dietary saturated fat and total fat, to limit sodium intake by avoiding high  sodium foods and not adding table salt, and to maintain adequate dietary potassium and calcium preferably from fresh fruits, vegetables, and low-fat dairy products.    Stressed the importance of regular exercise  Injury prevention: Discussed safety belts, safety helmets, smoke detector, smoking near bedding or upholstery.   Dental health: Discussed importance of regular tooth brushing, flossing, and dental visits.    NEXT PREVENTATIVE PHYSICAL DUE IN 1 YEAR. Return in about 1 year (around 05/21/2022) for Annual physical.

## 2021-05-21 NOTE — Assessment & Plan Note (Signed)
Referral back to GYN for check.  Patient would like hormone levels checked: FSH and LH ordered.

## 2021-05-21 NOTE — Patient Instructions (Signed)
[facility]  Address: [address], [place], [place] [address]  Phone: [phone]  Mammogram A mammogram is an X-ray of the breasts. This is done to check for changes that are not normal. This test can look for changes that may be caused by breast cancer or other problems. Mammograms are regularly done on women beginning at age 49. A man may have a mammogram if he has a lump or swelling in his breast. Tell a doctor: About any allergies you have. If you have breast implants. If you have had breast disease, biopsy, or surgery. If you have a family history of breast cancer. If you are breastfeeding. Whether you are pregnant or may be pregnant. What are the risks? Generally, this is a safe procedure. But problems may occur, including: Being exposed to radiation. Radiation levels are very low with this test. The need for more tests. The results were not read properly. Trouble finding breast cancer in women with dense breasts. What happens before the test? Have this test done about 1-2 weeks after your menstrual period. This is often when your breasts are the least tender. If you are visiting a new doctor or clinic, have any past mammogram images sent to your new doctor's office. Wash your breasts and under your arms on the day of the test. Do not use deodorants, perfumes, lotions, or powders on the day of the test. Take off any jewelry from your neck. Wear clothes that you can change into and out of easily. What happens during the test?  You will take off your clothes from the waist up. You will put on a gown. You will stand in front of the X-ray machine. Each breast will be placed between two plastic or glass plates. The plates will press down on your breast for a few seconds. Try to relax. This does not cause any harm to your breasts. It may not feel comfortable, but it will be very brief. X-rays will be taken from different angles of each  breast. The procedure may vary among doctors and hospitals. What can I expect after the test? The mammogram will be read by a specialist (radiologist). You may need to do parts of the test again. This depends on the quality of the images. You may go back to your normal activities. It is up to you to get the results of your test. Ask how to get your results when they are ready. Summary A mammogram is an X-ray of the breasts. It looks for changes that may be caused by breast cancer or other problems. A man may have this test if he has a lump or swelling in his breast. Before the test, tell your doctor about any breast problems that you have had in the past. Have this test done about 1-2 weeks after your menstrual period. Ask when your test results will be ready. Make sure you get your test results. This information is not intended to replace advice given to you by your health care provider. Make sure you discuss any questions you have with your health care provider. Document Revised: 08/18/2020 Document Reviewed: 08/18/2020 Elsevier Patient Education  2022 [employer]   

## 2021-05-21 NOTE — Assessment & Plan Note (Signed)
Followed by cardiology at this time.  Asymptomatic.  Did not tolerate beta blocker therapy.  Continue to monitor and continue collaboration with cardiology.  Lipid panel and TSH today.

## 2021-05-22 ENCOUNTER — Encounter: Payer: Self-pay | Admitting: Obstetrics and Gynecology

## 2021-05-22 LAB — POCT URINALYSIS DIPSTICK
Bilirubin, UA: NEGATIVE
Blood, UA: NEGATIVE
Glucose, UA: NEGATIVE
Ketones, UA: NEGATIVE
Leukocytes, UA: NEGATIVE
Nitrite, UA: NEGATIVE
Protein, UA: NEGATIVE
Spec Grav, UA: 1.01 (ref 1.010–1.025)
pH, UA: 6.5 (ref 5.0–8.0)

## 2021-05-22 LAB — POCT URINE PREGNANCY: Preg Test, Ur: NEGATIVE

## 2021-05-22 NOTE — Progress Notes (Signed)
Contacted via MyChart   Good afternoon Elizabeth Case, your labs have some returned, still waiting on one inflammatory test (ANA) and the Alpha 1 testing.  The remainder of labs at this time look fantastic, including cholesterol levels.  Hormone labs show no menopause at this time, but you could be in pre menopause time period, which these labs do not necessarily show.  I see you saw GYN today and all is well with IUD, there were no concerns there.  Which is great news.  They suspected more regular menstrual cycle.  If bleeding continues though I would return to see them.  Any questions?  I will let you know when remainder of labs return. Keep being awesome!!  Thank you for allowing me to participate in your care.  I appreciate you. Kindest regards, Delois Tolbert

## 2021-05-23 LAB — URINE CULTURE

## 2021-05-23 NOTE — Progress Notes (Signed)
Good morning Elizabeth Case, your ANA testing returned negative.  Great news.  I recall your mom's returned positive on this in past.  We will continue to monitor.  Alpha 1 testing still pending.  Will alert you when it returns.

## 2021-05-25 ENCOUNTER — Telehealth: Payer: Self-pay | Admitting: Obstetrics and Gynecology

## 2021-05-25 NOTE — Telephone Encounter (Signed)
Called pt. I reviewed labs with PCP and they were normal for her 1 mo of AUB/BTB with IUD. Pt states bleeding very minimal so thinks she was due for her period afterall and AUB resolved. Will cont to follow cycles for now. If AUB/BTB persists, will check GYN u/s. Pt to f/u prn.

## 2021-05-29 LAB — COMPREHENSIVE METABOLIC PANEL
ALT: 15 IU/L (ref 0–32)
AST: 16 IU/L (ref 0–40)
Albumin/Globulin Ratio: 2.2 (ref 1.2–2.2)
Albumin: 4.6 g/dL (ref 3.8–4.8)
Alkaline Phosphatase: 77 IU/L (ref 44–121)
BUN/Creatinine Ratio: 19 (ref 9–23)
BUN: 10 mg/dL (ref 6–24)
Bilirubin Total: 0.2 mg/dL (ref 0.0–1.2)
CO2: 24 mmol/L (ref 20–29)
Calcium: 8.9 mg/dL (ref 8.7–10.2)
Chloride: 103 mmol/L (ref 96–106)
Creatinine, Ser: 0.52 mg/dL — ABNORMAL LOW (ref 0.57–1.00)
Globulin, Total: 2.1 g/dL (ref 1.5–4.5)
Glucose: 84 mg/dL (ref 65–99)
Potassium: 4.5 mmol/L (ref 3.5–5.2)
Sodium: 139 mmol/L (ref 134–144)
Total Protein: 6.7 g/dL (ref 6.0–8.5)
eGFR: 114 mL/min/{1.73_m2} (ref >59)

## 2021-05-29 LAB — LIPID PANEL W/O CHOL/HDL RATIO
Cholesterol, Total: 163 mg/dL (ref 100–199)
HDL: 65 mg/dL (ref >39)
LDL Chol Calc (NIH): 88 mg/dL (ref 0–99)
Triglycerides: 47 mg/dL (ref 0–149)
VLDL Cholesterol Cal: 10 mg/dL (ref 5–40)

## 2021-05-29 LAB — CBC WITH DIFFERENTIAL/PLATELET
Basophils Absolute: 0.1 10*3/uL (ref 0.0–0.2)
Basos: 1 %
EOS (ABSOLUTE): 0.1 10*3/uL (ref 0.0–0.4)
Eos: 3 %
Hematocrit: 46.2 % (ref 34.0–46.6)
Hemoglobin: 15.1 g/dL (ref 11.1–15.9)
Immature Grans (Abs): 0 10*3/uL (ref 0.0–0.1)
Immature Granulocytes: 0 %
Lymphocytes Absolute: 1.5 10*3/uL (ref 0.7–3.1)
Lymphs: 31 %
MCH: 30.3 pg (ref 26.6–33.0)
MCHC: 32.7 g/dL (ref 31.5–35.7)
MCV: 93 fL (ref 79–97)
Monocytes Absolute: 0.3 10*3/uL (ref 0.1–0.9)
Monocytes: 7 %
Neutrophils Absolute: 2.8 10*3/uL (ref 1.4–7.0)
Neutrophils: 58 %
Platelets: 256 10*3/uL (ref 150–450)
RBC: 4.99 x10E6/uL (ref 3.77–5.28)
RDW: 12.3 % (ref 11.7–15.4)
WBC: 4.9 10*3/uL (ref 3.4–10.8)

## 2021-05-29 LAB — FSH/LH
FSH: 22.1 m[IU]/mL
LH: 10.4 m[IU]/mL

## 2021-05-29 LAB — ANA W/REFLEX IF POSITIVE: Anti Nuclear Antibody (ANA): NEGATIVE

## 2021-05-29 LAB — C-REACTIVE PROTEIN: CRP: <1 mg/L (ref 0–10)

## 2021-05-29 LAB — ALPHA-1-ANTITRYPSIN DEFICIENCY

## 2021-05-29 LAB — SEDIMENTATION RATE: Sed Rate: 2 mm/hr (ref 0–32)

## 2021-05-29 LAB — TSH: TSH: 3.84 u[IU]/mL (ref 0.450–4.500)

## 2021-06-01 ENCOUNTER — Other Ambulatory Visit: Payer: Self-pay

## 2021-06-01 ENCOUNTER — Ambulatory Visit
Admission: RE | Admit: 2021-06-01 | Discharge: 2021-06-01 | Disposition: A | Discharge status: Home / Self Care | Payer: BC Managed Care – PPO | Source: Ambulatory Visit | Attending: Nurse Practitioner | Admitting: Nurse Practitioner

## 2021-06-01 DIAGNOSIS — Z1231 Encounter for screening mammogram for malignant neoplasm of breast: Secondary | ICD-10-CM | POA: Insufficient documentation

## 2021-06-29 ENCOUNTER — Encounter: Payer: Self-pay | Admitting: Nurse Practitioner

## 2021-06-29 ENCOUNTER — Ambulatory Visit: Payer: BC Managed Care – PPO | Admitting: Nurse Practitioner

## 2021-06-29 ENCOUNTER — Other Ambulatory Visit: Payer: Self-pay

## 2021-06-29 VITALS — BP 132/66 | HR 89 | Temp 99.8°F | Wt 186.2 lb

## 2021-06-29 DIAGNOSIS — L2084 Intrinsic (allergic) eczema: Secondary | ICD-10-CM | POA: Diagnosis not present

## 2021-06-29 DIAGNOSIS — L219 Seborrheic dermatitis, unspecified: Secondary | ICD-10-CM

## 2021-06-29 MED ORDER — EUCRISA 2 % EX OINT: TOPICAL_OINTMENT | CUTANEOUS | 4 refills | Status: DC

## 2021-06-29 MED ORDER — KETOCONAZOLE 2 % EX SHAM: 1.0000 "application " | MEDICATED_SHAMPOO | CUTANEOUS | 4 refills | Status: DC

## 2021-06-29 NOTE — Assessment & Plan Note (Signed)
Chronic, ongoing with current flare not responding to steroid cream.  Will trial Eucrisa, educated her on this medication and use, can apply to patches on face -- do no get inside mouth.  If ongoing discussed possible need for referral to dermatology and use of monoclonal antibody treatment.

## 2021-06-29 NOTE — Assessment & Plan Note (Signed)
To scalp, at this time will treat with Ketoconazole shampoo and may apply a small amount of Eucrisa to patches as needed.  Return for worsening or ongoing.

## 2021-06-29 NOTE — Progress Notes (Signed)
BP 132/66   Pulse 89   Temp 99.8 F (37.7 C) (Oral)   Wt 186 lb 3.2 oz (84.5 kg)   SpO2 98%   BMI 28.31 kg/m    Subjective:    Patient ID: Elizabeth Case, female    DOB: 1971-09-21, 50 y.o.   MRN: [id number]  HPI: Elizabeth Case is a 50 y.o. female  Chief Complaint  Patient presents with   Eczema    Patient is here Eczema. Patient states she is itching, her mouth is itching and scalp. Patient states it is better than Thursday and states that stress triggers. Patient states her prescription Triamcinolone cream isn't helping any more with itching and would like to discuss.    ECZEMA Currently treating with Triamcinolone -- she does endorse increased stress due to heading back to school.   Duration:  chronic  Location:  scalp, face, and legs  Itching: yes Burning: yes Redness: yes Oozing: no Scaling: yes Blisters: no Painful: no Fevers: no Change in detergents/soaps/personal care products: no Recent illness: no Recent travel:no History of same: yes Context: worse Alleviating factors: nothing Treatments attempted: Aveeda lotion and Triamcinolone Shortness of breath: no  Throat/tongue swelling: no Myalgias/arthralgias: no   Relevant past medical, surgical, family and social history reviewed and updated as indicated. Interim medical history since our last visit reviewed. Allergies and medications reviewed and updated.  Review of Systems  Constitutional:  Negative for activity change, appetite change, diaphoresis, fatigue and fever.  Respiratory:  Negative for cough, chest tightness and shortness of breath.   Cardiovascular:  Negative for chest pain, palpitations and leg swelling.  Gastrointestinal: Negative.   Skin:  Positive for rash.  Neurological: Negative.   Psychiatric/Behavioral: Negative.     Per HPI unless specifically indicated above     Objective:    BP 132/66   Pulse 89   Temp 99.8 F (37.7 C) (Oral)   Wt 186 lb 3.2 oz (84.5 kg)   SpO2  98%   BMI 28.31 kg/m   Wt Readings from Last 3 Encounters:  06/29/21 186 lb 3.2 oz (84.5 kg)  05/21/21 179 lb (81.2 kg)  05/21/21 179 lb 6.4 oz (81.4 kg)    Physical Exam Vitals and nursing note reviewed.  Constitutional:      General: She is awake. She is not in acute distress.    Appearance: She is well-developed and well-groomed. She is not ill-appearing or toxic-appearing.  HENT:     Head: Normocephalic.     Right Ear: Hearing normal.     Left Ear: Hearing normal.  Eyes:     General: Lids are normal.        Right eye: No discharge.        Left eye: No discharge.     Conjunctiva/sclera: Conjunctivae normal.     Pupils: Pupils are equal, round, and reactive to light.  Neck:     Thyroid: No thyromegaly.     Vascular: No carotid bruit.  Cardiovascular:     Rate and Rhythm: Normal rate and regular rhythm.     Heart sounds: Normal heart sounds. No murmur heard.   No gallop.  Pulmonary:     Effort: Pulmonary effort is normal. No accessory muscle usage or respiratory distress.     Breath sounds: Normal breath sounds.  Abdominal:     General: Bowel sounds are normal.     Palpations: Abdomen is soft. There is no hepatomegaly or splenomegaly.  Musculoskeletal:  Cervical back: Normal range of motion and neck supple.     Right lower leg: No edema.     Left lower leg: No edema.  Lymphadenopathy:     Cervical: No cervical adenopathy.  Skin:    General: Skin is warm and dry.     Comments: Flaky, white skin noted to frontal aspect of scalp with shiny/scaly appearance.  To bilateral hands, R>L, MCP with erythema and scaling appearance, multiple patches.  Similar patches noted to posterior upper thighs.  Neurological:     Mental Status: She is alert and oriented to person, place, and time.  Psychiatric:        Attention and Perception: Attention normal.        Mood and Affect: Mood normal.        Speech: Speech normal.        Behavior: Behavior normal. Behavior is cooperative.         Thought Content: Thought content normal.   Results for orders placed or performed in visit on 05/21/21  Urine Culture   Specimen: Urine, Clean Catch   UC  Result Value Ref Range   Urine Culture, Routine Final report    Organism ID, Bacteria Comment   POCT urine pregnancy  Result Value Ref Range   Preg Test, Ur Negative Negative  POCT Urinalysis Dipstick  Result Value Ref Range   Color, UA yellow    Clarity, UA clear    Glucose, UA Negative Negative   Bilirubin, UA neg    Ketones, UA neg    Spec Grav, UA 1.010 1.010 - 1.025   Blood, UA neg    pH, UA 6.5 5.0 - 8.0   Protein, UA Negative Negative   Urobilinogen, UA     Nitrite, UA neg    Leukocytes, UA Negative Negative   Appearance     Odor        Assessment & Plan:   Problem List Items Addressed This Visit       Musculoskeletal and Integument   Eczema - Primary    Chronic, ongoing with current flare not responding to steroid cream.  Will trial Eucrisa, educated her on this medication and use, can apply to patches on face -- do no get inside mouth.  If ongoing discussed possible need for referral to dermatology and use of monoclonal antibody treatment.      Seborrheic dermatitis    To scalp, at this time will treat with Ketoconazole shampoo and may apply a small amount of Eucrisa to patches as needed.  Return for worsening or ongoing.        Follow up plan: Return if symptoms worsen or fail to improve.

## 2021-06-29 NOTE — Patient Instructions (Signed)
Crisaborole topical ointment What is this medication? CRISABOROLE Gaylyn Rong a BO role) is used topically to treat atopic dermatitis. This medicine may be used for other purposes; ask your health care provider orpharmacist if you have questions. COMMON BRAND NAME(S): EUCRISA What should I tell my care team before I take this medication? They need to know if you have any of these conditions: an unusual or allergic reaction to crisaborole, other medicines, foods, dyes, or preservatives pregnant or trying to get pregnant breast-feeding How should I use this medication? This medicine is for external use only. Do not take by mouth. Follow the direction on the prescription label. Wash your hands before and after use. Apply a thin film to the affected areas twice a day, or as often as directed by your doctor or health care professional. Rub the ointment in thoroughly. Do notuse your medicine more often than directed. Talk to your pediatrician regarding the use of this medicine in children. While this drug may be prescribed for children as young as 45 months of age forselected conditions, precautions do apply. Overdosage: If you think you have taken too much of this medicine contact apoison control center or emergency room at once. NOTE: This medicine is only for you. Do not share this medicine with others. What if I miss a dose? If you miss a dose, use it as soon as you can. If it is almost time for yournext dose, use only that dose. Do not use double or extra doses. What may interact with this medication? Interactions are not expected. Do not use any other skin products on theaffected area without asking your doctor or health care professional. This list may not describe all possible interactions. Give your health care provider a list of all the medicines, herbs, non-prescription drugs, or dietary supplements you use. Also tell them if you smoke, drink alcohol, or use illegaldrugs. Some items may interact  with your medicine. What should I watch for while using this medication? Do not get this medicine in your eyes. If you do, rinse out with plenty of cooltap water. Tell your doctor or health care professional if your symptoms do not start toget better or if they get worse. What side effects may I notice from receiving this medication? Side effects that you should report to your doctor or health care professionalas soon as possible: allergic reactions like skin rash, itching or hives, swelling of the face, lips, or tongue Side effects that usually do not require medical attention (report to yourdoctor or health care professional if they continue or are bothersome): hives stinging or burning This list may not describe all possible side effects. Call your doctor for medical advice about side effects. You may report side effects to FDA at1-800-FDA-1088. Where should I keep my medication? Keep out of the reach of children. Store at room temperature between 20 and 25 degrees C (68 and 77 degrees F). Keep this medicine in the original container. Throw away any unused medicineafter the expiration date. NOTE: This sheet is a summary. It may not cover all possible information. If you have questions about this medicine, talk to your doctor, pharmacist, orhealth care provider.  2022 Elsevier/Gold Standard (2019-01-23 14:27:54)

## 2021-07-02 ENCOUNTER — Telehealth: Payer: Self-pay

## 2021-07-02 NOTE — Telephone Encounter (Signed)
Unable to get patients insurance verified through cover my meds, patient is reaching out to see if there is something wrong with her coverage.

## 2021-07-02 NOTE — Telephone Encounter (Signed)
Copied from CRM [id number]. Topic: Quick Communication - Rx Refill/Question >> Jul 02, 2021 12:08 PM Pawlus, Maxine Glenn A wrote: Pt called in regarding MyChart messages she sent for a medication refill Eucrisa,  pt stated a PA is required, key code [id number] and asked "urgent" is written on it. Please advise.

## 2021-07-03 NOTE — Telephone Encounter (Signed)
Prior Authorization was initiated via CoverMyMeds for Rx Eucrisa 2% ointment.   KEY: [id number]

## 2021-09-14 ENCOUNTER — Ambulatory Visit: Payer: BC Managed Care – PPO | Admitting: Internal Medicine

## 2021-09-14 ENCOUNTER — Encounter: Payer: Self-pay | Admitting: Internal Medicine

## 2021-09-14 ENCOUNTER — Other Ambulatory Visit: Payer: Self-pay

## 2021-09-14 ENCOUNTER — Encounter: Payer: Self-pay | Admitting: Nurse Practitioner

## 2021-09-14 VITALS — BP 107/72 | HR 91 | Temp 98.7°F | Ht 67.99 in | Wt 189.8 lb

## 2021-09-14 DIAGNOSIS — N390 Urinary tract infection, site not specified: Secondary | ICD-10-CM | POA: Diagnosis not present

## 2021-09-14 DIAGNOSIS — R319 Hematuria, unspecified: Secondary | ICD-10-CM

## 2021-09-14 LAB — URINALYSIS, ROUTINE W REFLEX MICROSCOPIC
Bilirubin, UA: NEGATIVE
Glucose, UA: NEGATIVE
Ketones, UA: NEGATIVE
Nitrite, UA: NEGATIVE
Protein,UA: NEGATIVE
Specific Gravity, UA: 1.02 (ref 1.005–1.030)
Urobilinogen, Ur: 0.2 mg/dL (ref 0.2–1.0)
pH, UA: 6.5 (ref 5.0–7.5)

## 2021-09-14 LAB — MICROSCOPIC EXAMINATION

## 2021-09-14 MED ORDER — NITROFURANTOIN MONOHYD MACRO 100 MG PO CAPS: 100.0000 mg | ORAL_CAPSULE | Freq: Two times a day (BID) | ORAL | 0 refills | Status: AC

## 2021-09-14 NOTE — Progress Notes (Signed)
There were no vitals taken for this visit.   Subjective:    Patient ID: Elizabeth Case, female    DOB: 1970/11/30, 50 y.o.   MRN: [id number]  No chief complaint on file.   HPI: Elizabeth Case is a 50 y.o. female  Urinary Frequency  This is a new (pt says she has some burning in the urethra. LMP - 19th oct has an IUD to fu with obgyn) problem. The quality of the pain is described as burning. The pain is at a severity of 3/10. The pain is mild. There has been no fever. Associated symptoms include frequency. Pertinent negatives include no chills, discharge, flank pain, hematuria, hesitancy, nausea, possible pregnancy, sweats, urgency or vomiting.   No chief complaint on file.   Relevant past medical, surgical, family and social history reviewed and updated as indicated. Interim medical history since our last visit reviewed. Allergies and medications reviewed and updated.  Review of Systems  Constitutional:  Negative for chills.  Gastrointestinal:  Negative for nausea and vomiting.  Genitourinary:  Positive for frequency. Negative for flank pain, hematuria, hesitancy and urgency.   Per HPI unless specifically indicated above     Objective:    There were no vitals taken for this visit.  Wt Readings from Last 3 Encounters:  06/29/21 186 lb 3.2 oz (84.5 kg)  05/21/21 179 lb (81.2 kg)  05/21/21 179 lb 6.4 oz (81.4 kg)    Physical Exam Vitals and nursing note reviewed.  Constitutional:      General: She is not in acute distress.    Appearance: Normal appearance. She is not ill-appearing or diaphoretic.  Skin:    General: Skin is warm and dry.     Coloration: Skin is not jaundiced.     Findings: No erythema.  Neurological:     Mental Status: She is alert.   Results for orders placed or performed in visit on 05/21/21  Urine Culture   Specimen: Urine, Clean Catch   UC  Result Value Ref Range   Urine Culture, Routine Final report    Organism ID, Bacteria Comment    POCT urine pregnancy  Result Value Ref Range   Preg Test, Ur Negative Negative  POCT Urinalysis Dipstick  Result Value Ref Range   Color, UA yellow    Clarity, UA clear    Glucose, UA Negative Negative   Bilirubin, UA neg    Ketones, UA neg    Spec Grav, UA 1.010 1.010 - 1.025   Blood, UA neg    pH, UA 6.5 5.0 - 8.0   Protein, UA Negative Negative   Urobilinogen, UA     Nitrite, UA neg    Leukocytes, UA Negative Negative   Appearance     Odor          Current Outpatient Medications:    Crisaborole (EUCRISA) 2 % OINT, Apply a thin film to affected area(s) 2 times daily., Disp: 100 g, Rfl: 4   ketoconazole (NIZORAL) 2 % shampoo, Apply 1 application topically 2 (two) times a week., Disp: 120 mL, Rfl: 4   levonorgestrel (MIRENA) 20 MCG/24HR IUD, by Intrauterine route once for 1 dose., Disp: 1 each, Rfl: 0   Multiple Vitamin (MULTIVITAMIN) tablet, Take 1 tablet by mouth daily., Disp: , Rfl:    polyethylene glycol powder (GLYCOLAX/MIRALAX) 17 GM/SCOOP powder, Take 17 g by mouth daily., Disp: 3350 g, Rfl: 1   triamcinolone (KENALOG) 0.025 % cream, Apply 1 application topically 2 (two) times  daily., Disp: 30 g, Rfl: 5    Assessment & Plan:  Uti : Hematuria to fu with obgyn check UA.  pt is currently symptomatic for an Urinary tract infection(abd pain, burning etc), will cover with emperic abx, see med module for details.  encouraged to increase water/fluid intake.Signs and symptoms of emergency were discussed with the patient. The risks, benefits and side effects of treatment were discussed with the patient. The patient verbalized an understanding of plan, and was told to call the clinic/go to the ED if symptoms worsen at any point of time.      Problem List Items Addressed This Visit   None Visit Diagnoses     Urinary tract infection without hematuria, site unspecified    -  Primary   Relevant Orders   Urinalysis, Routine w reflex microscopic   Urine Culture         Orders Placed This Encounter  Procedures   Urine Culture   Urinalysis, Routine w reflex microscopic     No orders of the defined types were placed in this encounter.    Follow up plan: No follow-ups on file.

## 2021-09-19 LAB — URINE CULTURE

## 2021-09-28 ENCOUNTER — Ambulatory Visit: Payer: BC Managed Care – PPO | Admitting: Nurse Practitioner

## 2021-11-10 ENCOUNTER — Encounter: Payer: Self-pay | Admitting: Nurse Practitioner

## 2021-11-25 ENCOUNTER — Ambulatory Visit: Payer: BC Managed Care – PPO | Admitting: Nurse Practitioner

## 2021-11-25 ENCOUNTER — Other Ambulatory Visit: Payer: Self-pay

## 2021-11-25 ENCOUNTER — Encounter: Payer: Self-pay | Admitting: Nurse Practitioner

## 2021-11-25 VITALS — BP 105/71 | HR 90 | Temp 98.1°F | Wt 196.0 lb

## 2021-11-25 DIAGNOSIS — R8281 Pyuria: Secondary | ICD-10-CM | POA: Diagnosis not present

## 2021-11-25 DIAGNOSIS — R399 Unspecified symptoms and signs involving the genitourinary system: Secondary | ICD-10-CM | POA: Diagnosis not present

## 2021-11-25 DIAGNOSIS — J3489 Other specified disorders of nose and nasal sinuses: Secondary | ICD-10-CM | POA: Insufficient documentation

## 2021-11-25 LAB — URINALYSIS, ROUTINE W REFLEX MICROSCOPIC
Bilirubin, UA: NEGATIVE
Glucose, UA: NEGATIVE
Ketones, UA: NEGATIVE
Nitrite, UA: NEGATIVE
Protein,UA: NEGATIVE
Specific Gravity, UA: 1.015 (ref 1.005–1.030)
Urobilinogen, Ur: 0.2 mg/dL (ref 0.2–1.0)
pH, UA: 7 (ref 5.0–7.5)

## 2021-11-25 LAB — MICROSCOPIC EXAMINATION

## 2021-11-25 LAB — WET PREP FOR TRICH, YEAST, CLUE
Clue Cell Exam: NEGATIVE
Trichomonas Exam: NEGATIVE
Yeast Exam: NEGATIVE

## 2021-11-25 MED ORDER — FLUCONAZOLE 150 MG PO TABS: 150.0000 mg | ORAL_TABLET | Freq: Once | ORAL | 0 refills | Status: AC

## 2021-11-25 MED ORDER — AMOXICILLIN-POT CLAVULANATE 875-125 MG PO TABS: 1.0000 | ORAL_TABLET | Freq: Two times a day (BID) | ORAL | 0 refills | Status: AC

## 2021-11-25 NOTE — Assessment & Plan Note (Addendum)
Acute and ongoing for 2 weeks with negative Covid testing at home.  At this time suspect this is more bacterial due to ongoing symptoms.  Will send in Augmentin BID for 7 days to treat both current urinary symptoms and sinus infection.  Recommend: - Increased rest - Increasing Fluids - Acetaminophen as needed for fever/pain.  - Salt water gargling, chloraseptic spray and throat lozenges - Mucinex.  - Humidifying the air. Return for worsening or ongoing.

## 2021-11-25 NOTE — Progress Notes (Signed)
BP 105/71    Pulse 90    Temp 98.1 F (36.7 C) (Oral)    Wt 196 lb (88.9 kg)    SpO2 96%    BMI 29.81 kg/m    Subjective:    Patient ID: Elizabeth Case, female    DOB: 1970/12/17, 51 y.o.   MRN: [id number]  HPI: Elizabeth Case is a 51 y.o. female  Chief Complaint  Patient presents with   Urinary Tract Infection    Patient states about a week ago, she had burning with urination, sharp stomach pain, and then she had lower back pain that radiate around to her lower abdomen. Patient states she is getting a little better and she is still having a little burning with urination and she has tried over the counter Azo. Patient states she does not know if she has another UTI or a possibly kidney stone.   Sinus Problem    Patient states for the past 2 weeks, she has noticed a lot of runny nose and sneezing, but denies any fever, chills or sore throat and patient states she took an at home Covid test last week and it was negative. Patient states she notices the drainage is worse at night and it is completely stopped up. Patient states she has taking Mucinex to help with the symptoms.    URINARY SYMPTOMS Started about one week ago, last treated in November for similar with Macrobid.  In last year has had UTI 04/01/21 and 09/14/21. Dysuria: burning Urinary frequency: yes Urgency: yes Small volume voids: yes Symptom severity: yes Urinary incontinence: no Foul odor: no Hematuria: no Abdominal pain: yes Back pain: yes Suprapubic pain/pressure: yes Flank pain: yes -- over weekend to right lower back and radiated around the front Fever:  no Vomiting:  a little nausea Relief with cranberry juice: no Relief with pyridium: yes Status: stable Previous urinary tract infection: yes Recurrent urinary tract infection: no Sexual activity: monogamous History of sexually transmitted disease: no Treatments attempted: pyridium, cranberry, and increasing fluids    SINUS PRESSURE Started out as a  cold 2 weeks ago, did Covid last week which was negative.  Has had Covid vaccines.  Symptoms ongoing for 2 weeks at this time, worse in the morning.   Fever: no Cough: no Shortness of breath: no Wheezing: no Chest pain: no Chest tightness: no Chest congestion: no Nasal congestion: yes Runny nose: yes Post nasal drip: yes Sneezing: yes Sore throat: no Swollen glands: no Sinus pressure: yes Headache: yes Face pain: yes Toothache: no Ear pain: none Ear pressure: yes bilateral Eyes red/itching:no Eye drainage/crusting: no  Vomiting: no Rash: no Fatigue: no Sick contacts: yes Strep contacts: no  Context: fluctuating Recurrent sinusitis: no Relief with OTC cold/cough medications: yes  Treatments attempted: mucinex    Relevant past medical, surgical, family and social history reviewed and updated as indicated. Interim medical history since our last visit reviewed. Allergies and medications reviewed and updated.  Review of Systems  Constitutional:  Negative for activity change, appetite change, diaphoresis, fatigue and fever.  HENT:  Positive for postnasal drip, rhinorrhea, sinus pressure and sneezing. Negative for congestion, ear discharge, ear pain, sinus pain and sore throat.   Respiratory: Negative.    Cardiovascular: Negative.   Gastrointestinal: Negative.   Genitourinary:  Positive for decreased urine volume, dysuria, flank pain, frequency and urgency. Negative for hematuria.  Musculoskeletal:  Positive for back pain.  Skin: Negative.   Neurological: Negative.    Per HPI unless  specifically indicated above     Objective:    BP 105/71    Pulse 90    Temp 98.1 F (36.7 C) (Oral)    Wt 196 lb (88.9 kg)    SpO2 96%    BMI 29.81 kg/m   Wt Readings from Last 3 Encounters:  11/25/21 196 lb (88.9 kg)  09/14/21 189 lb 12.8 oz (86.1 kg)  06/29/21 186 lb 3.2 oz (84.5 kg)    Physical Exam Vitals and nursing note reviewed.  Constitutional:      General: She is awake.  She is not in acute distress.    Appearance: Normal appearance. She is well-groomed. She is not ill-appearing or toxic-appearing.  HENT:     Head: Normocephalic.     Right Ear: Hearing, ear canal and external ear normal. A middle ear effusion is present.     Left Ear: Hearing, ear canal and external ear normal. A middle ear effusion is present.     Nose:     Right Sinus: Maxillary sinus tenderness present. No frontal sinus tenderness.     Left Sinus: Maxillary sinus tenderness present. No frontal sinus tenderness.     Mouth/Throat:     Mouth: Mucous membranes are moist.     Pharynx: Oropharynx is clear. No pharyngeal swelling or posterior oropharyngeal erythema.     Tonsils: 1+ on the right. 1+ on the left.  Eyes:     General: Lids are normal.     Conjunctiva/sclera: Conjunctivae normal.  Neck:     Thyroid: No thyromegaly.     Vascular: No carotid bruit.  Cardiovascular:     Rate and Rhythm: Normal rate and regular rhythm.     Pulses: Normal pulses.     Heart sounds: Normal heart sounds. No murmur heard.   No gallop.  Pulmonary:     Effort: Pulmonary effort is normal. No accessory muscle usage or respiratory distress.     Breath sounds: Normal breath sounds.  Abdominal:     General: There is no distension.     Palpations: Abdomen is soft.     Tenderness: There is no abdominal tenderness. There is no right CVA tenderness or left CVA tenderness.  Musculoskeletal:     Cervical back: Normal range of motion.     Right lower leg: No edema.     Left lower leg: No edema.  Skin:    General: Skin is warm.  Neurological:     General: No focal deficit present.     Mental Status: She is alert and oriented to person, place, and time.  Psychiatric:        Attention and Perception: Attention normal.        Mood and Affect: Mood normal.        Speech: Speech normal.        Behavior: Behavior normal. Behavior is cooperative.        Thought Content: Thought content normal.    Results for  orders placed or performed in visit on 09/14/21  Urine Culture   Specimen: Urine   UR  Result Value Ref Range   Urine Culture, Routine Final report    Organism ID, Bacteria Comment   Microscopic Examination   Urine  Result Value Ref Range   WBC, UA 0-5 0 - 5 /hpf   RBC 3-10 (A) 0 - 2 /hpf   Epithelial Cells (non renal) 0-10 0 - 10 /hpf   Bacteria, UA Few (A) None seen/Few  Urinalysis, Routine w  reflex microscopic  Result Value Ref Range   Specific Gravity, UA 1.020 1.005 - 1.030   pH, UA 6.5 5.0 - 7.5   Color, UA Yellow Yellow   Appearance Ur Clear Clear   Leukocytes,UA 1+ (A) Negative   Protein,UA Negative Negative/Trace   Glucose, UA Negative Negative   Ketones, UA Negative Negative   RBC, UA 1+ (A) Negative   Bilirubin, UA Negative Negative   Urobilinogen, Ur 0.2 0.2 - 1.0 mg/dL   Nitrite, UA Negative Negative   Microscopic Examination See below:       Assessment & Plan:   Problem List Items Addressed This Visit       Other   Sinus pressure    Acute and ongoing for 2 weeks with negative Covid testing at home.  At this time suspect this is more bacterial due to ongoing symptoms.  Will send in Augmentin BID for 7 days to treat both current urinary symptoms and sinus infection.  Recommend: - Increased rest - Increasing Fluids - Acetaminophen as needed for fever/pain.  - Salt water gargling, chloraseptic spray and throat lozenges - Mucinex.  - Humidifying the air. Return for worsening or ongoing.      Urinary symptom or sign - Primary    Acute and ongoing for one week.  Has tried OTC remedies with no benefit.  Has had recent UTIs 04/01/21 and 09/14/21.  At this time UA with trace BLD, 1+ LEUK, moderate bacteria.  Will send for culture and obtain wet prep (this is negative today).  Start Augmentin BID and send in Diflucan with this for yeast prevention. May need to consider referral to urology if ongoing recurrence of UTIs.  Will adjust abx regimen as needed based on  culture.      Relevant Orders   WET PREP FOR TRICH, YEAST, CLUE   Urinalysis, Routine w reflex microscopic   Other Visit Diagnoses     Pyuria       Urine sent for culture.   Relevant Orders   Urine Culture        Follow up plan: Return if symptoms worsen or fail to improve.

## 2021-11-25 NOTE — Patient Instructions (Signed)

## 2021-11-25 NOTE — Assessment & Plan Note (Addendum)
Acute and ongoing for one week.  Has tried OTC remedies with no benefit.  Has had recent UTIs 04/01/21 and 09/14/21.  At this time UA with trace BLD, 1+ LEUK, moderate bacteria.  Will send for culture and obtain wet prep (this is negative today).  Start Augmentin BID and send in Diflucan with this for yeast prevention. May need to consider referral to urology if ongoing recurrence of UTIs.  Will adjust abx regimen as needed based on culture.

## 2021-11-26 ENCOUNTER — Ambulatory Visit: Payer: BC Managed Care – PPO | Admitting: Nurse Practitioner

## 2021-11-27 ENCOUNTER — Encounter: Payer: Self-pay | Admitting: Nurse Practitioner

## 2021-11-27 LAB — URINE CULTURE

## 2021-11-27 NOTE — Progress Notes (Signed)
Contacted via MyChart   Good evening Seven, your urine returned showing <100,000 growth, which is good.  How are you feeling?  I would continue Augmentin at this time as it may be at tail end of UTI or beginning.

## 2021-12-08 ENCOUNTER — Encounter: Payer: Self-pay | Admitting: Nurse Practitioner

## 2021-12-08 MED ORDER — AZITHROMYCIN 250 MG PO TABS: ORAL_TABLET | ORAL | 0 refills | Status: AC

## 2021-12-08 MED ORDER — PREDNISONE 20 MG PO TABS: 40.0000 mg | ORAL_TABLET | Freq: Every day | ORAL | 0 refills | Status: AC

## 2021-12-28 ENCOUNTER — Encounter: Payer: Self-pay | Admitting: Obstetrics and Gynecology

## 2021-12-28 DIAGNOSIS — Z30431 Encounter for routine checking of intrauterine contraceptive device: Secondary | ICD-10-CM

## 2021-12-28 DIAGNOSIS — N939 Abnormal uterine and vaginal bleeding, unspecified: Secondary | ICD-10-CM

## 2022-01-13 ENCOUNTER — Encounter: Payer: Self-pay | Admitting: Obstetrics and Gynecology

## 2022-01-14 ENCOUNTER — Telehealth: Payer: Self-pay | Admitting: Obstetrics and Gynecology

## 2022-01-14 ENCOUNTER — Other Ambulatory Visit: Payer: Self-pay | Admitting: Obstetrics and Gynecology

## 2022-01-14 ENCOUNTER — Other Ambulatory Visit: Payer: Self-pay

## 2022-01-14 ENCOUNTER — Ambulatory Visit (INDEPENDENT_AMBULATORY_CARE_PROVIDER_SITE_OTHER): Payer: BC Managed Care – PPO

## 2022-01-14 ENCOUNTER — Telehealth: Payer: Self-pay

## 2022-01-14 ENCOUNTER — Encounter: Payer: Self-pay | Admitting: Obstetrics and Gynecology

## 2022-01-14 ENCOUNTER — Ambulatory Visit (INDEPENDENT_AMBULATORY_CARE_PROVIDER_SITE_OTHER): Payer: BC Managed Care – PPO | Admitting: Obstetrics and Gynecology

## 2022-01-14 DIAGNOSIS — Z30431 Encounter for routine checking of intrauterine contraceptive device: Secondary | ICD-10-CM | POA: Diagnosis not present

## 2022-01-14 DIAGNOSIS — R399 Unspecified symptoms and signs involving the genitourinary system: Secondary | ICD-10-CM | POA: Diagnosis not present

## 2022-01-14 DIAGNOSIS — N939 Abnormal uterine and vaginal bleeding, unspecified: Secondary | ICD-10-CM

## 2022-01-14 LAB — POCT URINALYSIS DIPSTICK
Bilirubin, UA: NEGATIVE
Glucose, UA: NEGATIVE
Ketones, UA: NEGATIVE
Nitrite, UA: NEGATIVE
Protein, UA: POSITIVE — AB
Spec Grav, UA: <=1.005 — AB (ref 1.010–1.025)
Urobilinogen, UA: 0.2 E.U./dL
pH, UA: 6.5 (ref 5.0–8.0)

## 2022-01-14 MED ORDER — FLUCONAZOLE 150 MG PO TABS: 150.0000 mg | ORAL_TABLET | Freq: Once | ORAL | 0 refills | Status: AC

## 2022-01-14 MED ORDER — SULFAMETHOXAZOLE-TRIMETHOPRIM 800-160 MG PO TABS: 1.0000 | ORAL_TABLET | Freq: Two times a day (BID) | ORAL | 0 refills | Status: AC

## 2022-01-14 NOTE — Telephone Encounter (Signed)
Patient is scheduled for 01/18/22 with [facility] for mirena replacement

## 2022-01-14 NOTE — Progress Notes (Signed)
Rx diflucan for UTI abx tx ?

## 2022-01-14 NOTE — Telephone Encounter (Signed)
Pt aware of GYN u/s results. Having AUB. IUD malpositioned and cause of sx. Pt to RTO for Mirena replacement. Hx of menorrhagia prior to IUD.  ?

## 2022-01-14 NOTE — Progress Notes (Signed)
? ?  Chief Complaint  ?Patient presents with  ? Contraception  ?  IUD replacement  ? ? ? ?History of Present Illness:  Elizabeth Case is a 51 y.o. that had a Mirena IUD REplaced 12/28/17 for menorrhagia. Since that time, she has done well. Has had AUB recently, GYN u/s 01/14/22 showed malpositioned IUD. Would like replacement.  ?Neg pap/neg HPV DNA 01/31/18 ? ?BP 126/84   Ht 5\' 8"  (1.727 m)   Wt 200 lb (90.7 kg)   LMP 12/15/2021 (Exact Date)   BMI 30.41 kg/m?  ? ?Pelvic exam:  ?Two IUD strings present seen coming from the cervical os. ?EGBUS, vaginal vault and cervix: within normal limits ? ?IUD Removal ?Strings of IUD identified and grasped.  IUD removed without problem with ring forceps.  Pt tolerated this well.  IUD noted to be intact. ? ? ?IUD Insertion Procedure Note ?Patient identified, informed consent performed, consent signed.   Discussed risks of irregular bleeding, cramping, infection, malpositioning or misplacement of the IUD outside the uterus which may require further procedure such as laparoscopy, risk of failure <1%. Time out was performed.   ? ?Speculum placed in the vagina.  Cervix visualized.  Cleaned with Betadine x 2.  Grasped anteriorly with a single tooth tenaculum.  Uterus sounded to 7.5 cm.   IUD placed per manufacturer's recommendations.  Strings trimmed to 3 cm. Tenaculum was removed, good hemostasis noted.  Patient tolerated procedure well.  ? ?ASSESSMENT: ? ?Encounter for removal and reinsertion of intrauterine contraceptive device (IUD) - Plan: levonorgestrel (MIRENA) 20 MCG/DAY IUD ? ?Malpositioned intrauterine device (IUD), initial encounter - Plan: levonorgestrel (MIRENA) 20 MCG/DAY IUD ? ? ?Meds ordered this encounter  ?Medications  ? levonorgestrel (MIRENA) 20 MCG/DAY IUD  ?  Sig: 1 each by Intrauterine route once for 1 dose.  ?  Dispense:  1 each  ?  Refill:  0  ?  Order Specific Question:   Supervising Provider  ?  Answer02/16/2023 Nadara Mustard  ? ? ? ?Plan: ? ?Patient  was given post-procedure instructions.  She was advised to have backup contraception for one week.   ?Call if you are having increasing pain, cramps or bleeding or if you have a fever greater than 100.4 degrees F., shaking chills, nausea or vomiting. ?Patient was also asked to check IUD strings periodically and follow up in 4 weeks for IUD check. ? ?Return in about 4 weeks (around 02/15/2022) for IUD f/u. ? ?Kamelia Lampkins B. Josh Nicolosi, PA-C ?01/18/2022 ?9:23 AM ? ? ?  ?

## 2022-01-14 NOTE — Progress Notes (Signed)
Urine culture order placed.

## 2022-01-15 NOTE — Telephone Encounter (Signed)
Noted. Mirena reserved for this patient. 

## 2022-01-18 ENCOUNTER — Other Ambulatory Visit: Payer: Self-pay

## 2022-01-18 ENCOUNTER — Ambulatory Visit: Payer: BC Managed Care – PPO | Admitting: Obstetrics and Gynecology

## 2022-01-18 VITALS — BP 126/84 | Ht 68.0 in | Wt 200.0 lb

## 2022-01-18 DIAGNOSIS — Z30433 Encounter for removal and reinsertion of intrauterine contraceptive device: Secondary | ICD-10-CM | POA: Diagnosis not present

## 2022-01-18 DIAGNOSIS — T8332XA Displacement of intrauterine contraceptive device, initial encounter: Secondary | ICD-10-CM

## 2022-01-18 LAB — URINE CULTURE

## 2022-01-18 MED ORDER — LEVONORGESTREL 20 MCG/DAY IU IUD: 1.0000 | INTRAUTERINE_SYSTEM | Freq: Once | INTRAUTERINE | 0 refills | Status: DC

## 2022-01-18 NOTE — Patient Instructions (Addendum)
I value your feedback and you entrusting [place] with your care. If you get a [facility] patient survey, I would appreciate you taking the time to let us know about your experience today. Thank you! ? ? ?Instructions after IUD insertion ? ?Most women experience no significant problems after insertion of an IUD, however minor cramping and spotting for a few days is common. Cramps may be treated with ibuprofen 800mg  every 8 hours or Tylenol 650 mg every 4 hours. ?Contact [facility] immediately if you experience any of the following symptoms during the next week: temperature >99.6 degrees, worsening pelvic pain, abdominal pain, fainting, unusually heavy vaginal bleeding, foul vaginal discharge, or if you think you have expelled the IUD.  ?Nothing inserted in the vagina for 48 hours. ?You will be scheduled for a follow up visit in approximately four weeks. ? You should check monthly to be sure you can feel the IUD strings in the upper vagina. If you are having a monthly period, try to check after each period. ?If you cannot feel the IUD strings,  contact [facility] immediately so we can do an exam to determine if the IUD has been expelled.   Please use backup protection until we can confirm the IUD is in place.  ?Call [facility] if you are exposed to or diagnosed with a sexually transmitted infection, as we will need to discuss whether it is safe for you to continue using an IUD.  ? ?

## 2022-01-25 NOTE — Telephone Encounter (Signed)
Mirena rcvd/charged 01/18/2022 ?

## 2022-02-02 ENCOUNTER — Encounter: Payer: Self-pay | Admitting: Cardiology

## 2022-02-02 ENCOUNTER — Ambulatory Visit: Payer: BC Managed Care – PPO | Admitting: Cardiology

## 2022-02-02 VITALS — BP 110/80 | HR 98 | Ht 69.0 in | Wt 193.0 lb

## 2022-02-02 DIAGNOSIS — I493 Ventricular premature depolarization: Secondary | ICD-10-CM

## 2022-02-02 NOTE — Progress Notes (Signed)
?Cardiology Office Note:   ? ?Date:  02/02/2022  ? ?ID:  Elizabeth GuarneriKimberly J Kiely, DOB 29-Dec-1970, MRN [id number] ? ?PCP:  Marjie Skiffannady, Jolene T, NP  ?[facility] Cardiologist:  Debbe OdeaBrian Agbor-Etang, MD  ?[facility] Electrophys[facility]:  None  ? ?Referring MD: Marjie Skiffannady, Jolene T, NP  ? ?Chief Complaint  ?Patient presents with  ? Other  ?  12 month follow up. Patient c.o cramping in legs. Meds reviewed verbally with patient.   ? ? ?History of Present Illness:   ? ?Elizabeth Case is a 51 y.o. female with a hx of GERD, frequent PVCs who presents for follow-up.   ? ?Being seen for frequent PVCs.  She still has occasional skipped heartbeats, denies dizziness, syncope.  Beta-blocker was previously tried but patient did not tolerate, with symptoms of fatigue and dizziness.  She otherwise feels well, last echocardiogram in 2021 showed preserved ejection fraction.   ? ?Prior notes ?Echocardiogram 07/2020 normal systolic and diastolic function, EF 55 to 60%. ?Cardiac monitor: 06/2020 frequent PVCs, 15% PVC burden. ? ? ?Past Medical History:  ?Diagnosis Date  ? Allergic rhinitis   ? Eczema   ? GERD (gastroesophageal reflux disease)   ? Migraines   ? 1x/mo  ? Vitamin D deficiency disease   ? ? ?Past Surgical History:  ?Procedure Laterality Date  ? BREAST BIOPSY    ? BREAST EXCISIONAL BIOPSY Right   ? BREAST REDUCTION SURGERY  1994  ? BREAST SURGERY Right 2012  ? lumpectomy  ? CHOLECYSTECTOMY  1998  ? COLONOSCOPY WITH PROPOFOL N/A 09/08/2016  ? Procedure: COLONOSCOPY WITH PROPOFOL;  Surgeon: Wyline MoodKiran Anna, MD;  [facility];  Service: Endoscopy;  Laterality: N/A;  Latex sensitivity  ? REDUCTION MAMMAPLASTY Bilateral 1994  ? ? ?Current Medications: ?Current Meds  ?Medication Sig  ? Crisaborole (EUCRISA) 2 % OINT Apply a thin film to affected area(s) 2 times daily.  ? ketoconazole (NIZORAL) 2 % shampoo Apply 1 application topically 2 (two) times a week.  ? levonorgestrel (MIRENA) 20 MCG/DAY IUD 1 each by Intrauterine route once  for 1 dose.  ? Multiple Vitamin (MULTIVITAMIN) tablet Take 1 tablet by mouth daily.  ? polyethylene glycol powder (GLYCOLAX/MIRALAX) 17 GM/SCOOP powder Take 17 g by mouth daily.  ? triamcinolone (KENALOG) 0.025 % cream Apply 1 application topically 2 (two) times daily.  ?  ? ?Allergies:   Kiwi extract, Latex, and Other  ? ?Social History  ? ?Socioeconomic History  ? Marital status: Married  ?  Spouse name: Elizabeth Case  ? Number of children: 2  ? Years of education: 4116  ? Highest education level: Bachelor's degree (e.g., BA, AB, BS)  ?Occupational History  ? Not on file  ?Tobacco Use  ? Smoking status: Never  ? Smokeless tobacco: Never  ?Vaping Use  ? Vaping Use: Never used  ?Substance and Sexual Activity  ? Alcohol use: Yes  ?  Alcohol/week: 0.0 standard drinks  ?  Comment: holidays  ? Drug use: No  ? Sexual activity: Yes  ?  Birth control/protection: I.U.D.  ?  Comment: Mirena  ?Other Topics Concern  ? Not on file  ?Social History Narrative  ? Not on file  ? ?Social Determinants of Health  ? ?Financial Resource Strain: Not on file  ?Food Insecurity: Not on file  ?Transportation Needs: Not on file  ?Physical Activity: Not on file  ?Stress: Not on file  ?Social Connections: Not on file  ?  ? ?Family History: ?The patient's family history includes Aneurysm  in her paternal grandfather; Breast cancer in her paternal grandmother; Cancer in her paternal grandmother; Cancer (age of onset: 55) in her father; Hypertension in her father and mother; Pulmonary fibrosis in her mother; Stroke in her maternal grandmother and paternal grandfather. There is no history of Diabetes or Heart disease. ? ?ROS:   ?Please see the history of present illness.    ? All other systems reviewed and are negative. ? ?EKGs/Labs/Other Studies Reviewed:   ? ?The following studies were reviewed today: ? ? ?EKG:  EKG is  ordered today.  The ekg ordered today demonstrates sinus rhythm, occasional PVCs ? ?Recent Labs: ?05/21/2021: ALT 15; BUN 10; Creatinine,  Ser 0.52; Hemoglobin 15.1; Platelets 256; Potassium 4.5; Sodium 139; TSH 3.840  ?Recent Lipid Panel ?   ?Component Value Date/Time  ? CHOL 163 05/21/2021 0851  ? TRIG 47 05/21/2021 0851  ? HDL 65 05/21/2021 0851  ? CHOLHDL 3.4 05/08/2020 1533  ? VLDL 20 02/08/2017 1418  ? LDLCALC 88 05/21/2021 0851  ? LDLCALC 86 05/08/2020 1533  ? ? ?Physical Exam:   ? ?VS:  BP 110/80 (BP Location: Left Arm, Patient Position: Sitting, Cuff Size: Normal)   Pulse 98   Ht 5\' 9"  (1.753 m)   Wt 193 lb (87.5 kg)   BMI 28.50 kg/m?    ? ?Wt Readings from Last 3 Encounters:  ?02/02/22 193 lb (87.5 kg)  ?01/18/22 200 lb (90.7 kg)  ?11/25/21 196 lb (88.9 kg)  ?  ? ?GEN:  Well nourished, well developed in no acute distress ?HEENT: Normal ?NECK: No JVD; No carotid bruits ?LYMPHATICS: No lymphadenopathy ?CARDIAC: RRR, occasional skipped beats, no murmurs, rubs, gallops ?RESPIRATORY:  Clear to auscultation without rales, wheezing or rhonchi  ?ABDOMEN: Soft, non-tender, non-distended ?MUSCULOSKELETAL:  No edema; No deformity  ?SKIN: Warm and dry ?NEUROLOGIC:  Alert and oriented x 3 ?PSYCHIATRIC:  Normal affect  ? ?ASSESSMENT:   ? ?1. Frequent unifocal PVCs   ? ?PLAN:   ? ?In order of problems listed above: ? 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  Echo in 2021 shows normal systolic function.  Repeat echocardiogram to make sure EF is staying normal.  Patient did not tolerate beta-blocker/Toprol-XL in the past.  Currently asymptomatic.  ? ? ?Follow-up in 1 year ? ?Total encounter time 30 minutes ? Greater than 50% was spent in counseling and coordination of care with the patient ? ? ?This note was generated in part or whole with voice recognition software. Voice recognition is usually quite accurate but there are transcription errors that can and very often do occur. I apologize for any typographical errors that were not detected and corrected. ? ?Medication Adjustments/Labs and Tests Ordered: ?Current medicines are reviewed at  length with the patient today.  Concerns regarding medicines are outlined above.  ?Orders Placed This Encounter  ?Procedures  ? EKG 12-Lead  ? ECHOCARDIOGRAM COMPLETE  ? ?No orders of the defined types were placed in this encounter. ? ? ?Patient Instructions  ?Medication Instructions:  ?Your physician recommends that you continue on your current medications as directed. Please refer to the Current Medication list given to you today. ? ?*If you need a refill on your cardiac medications before your next appointment, please call your pharmacy* ? ? ?Lab Work: ?None ordered ?If you have labs (blood work) drawn today and your tests are completely normal, you will receive your results only by: ?MyChart Message (if you have MyChart) OR ?A paper copy in the mail ?If you  have any lab test that is abnormal or we need to change your treatment, we will call you to review the results. ? ? ?Testing/Procedures: ? ?Your physician has requested that you have an echocardiogram in 6 months. Echocardiography is a painless test that uses sound waves to create images of your heart. It provides your doctor with information about the size and shape of your heart and how well your heart?s chambers and valves are working. This procedure takes approximately one hour. There are no restrictions for this procedure. ? ? ? ?Follow-Up: ?At [facility], you and your health needs are our priority.  As part of our continuing mission to provide you with exceptional heart care, we have created designated Provider Care Teams.  These Care Teams include your primary Cardiologist (physician) and Advanced Practice Providers (APPs -  Physician Assistants and Nurse Practitioners) who all work together to provide you with the care you need, when you need it. ? ?We recommend signing up for the patient portal called "MyChart".  Sign up information is provided on this After Visit Summary.  MyChart is used to connect with patients for Virtual Visits  (Telemedicine).  Patients are able to view lab/test results, encounter notes, upcoming appointments, etc.  Non-urgent messages can be sent to your provider as well.   ?To learn more about what you can do with MyChart, go t

## 2022-02-02 NOTE — Patient Instructions (Signed)
Medication Instructions:  ?Your physician recommends that you continue on your current medications as directed. Please refer to the Current Medication list given to you today. ? ?*If you need a refill on your cardiac medications before your next appointment, please call your pharmacy* ? ? ?Lab Work: ?None ordered ?If you have labs (blood work) drawn today and your tests are completely normal, you will receive your results only by: ?MyChart Message (if you have MyChart) OR ?A paper copy in the mail ?If you have any lab test that is abnormal or we need to change your treatment, we will call you to review the results. ? ? ?Testing/Procedures: ? ?Your physician has requested that you have an echocardiogram in 6 months. Echocardiography is a painless test that uses sound waves to create images of your heart. It provides your doctor with information about the size and shape of your heart and how well your heart?s chambers and valves are working. This procedure takes approximately one hour. There are no restrictions for this procedure. ? ? ? ?Follow-Up: ?At [facility], you and your health needs are our priority.  As part of our continuing mission to provide you with exceptional heart care, we have created designated Provider Care Teams.  These Care Teams include your primary Cardiologist (physician) and Advanced Practice Providers (APPs -  Physician Assistants and Nurse Practitioners) who all work together to provide you with the care you need, when you need it. ? ?We recommend signing up for the patient portal called "MyChart".  Sign up information is provided on this After Visit Summary.  MyChart is used to connect with patients for Virtual Visits (Telemedicine).  Patients are able to view lab/test results, encounter notes, upcoming appointments, etc.  Non-urgent messages can be sent to your provider as well.   ?To learn more about what you can do with MyChart, go to [email or web address].   ? ?Your next  appointment:   ?1 year(s) ? ?The format for your next appointment:   ?In Person ? ?Provider:   ?You may see Debbe Odea, MD or one of the following Advanced Practice Providers on your designated Care Team:   ?Nicolasa Ducking, NP ?Eula Listen, PA-C ?Cadence Fransico Michael, PA-C  ? ? ?Other Instructions ? ? ?

## 2022-02-10 NOTE — Progress Notes (Signed)
? ?  Chief Complaint  ?Patient presents with  ? IUD check  ?  No concerns  ? ? ? ?History of Present Illness:  Elizabeth Case is a 51 y.o. that had a Mirena IUD placed approximately 1 month ago. Since that time, she denies dyspareunia, pelvic pain, vaginal d/c, heavy bleeding. GYN u/s 01/14/22 showed malpositioned IUD so replaced 1 mo ago. Has had some BTB/light bleeding, no dysmen. Was having AUB prior to IUD change.  ? ?Has had some abd pains with diarrhea/extra gas for a few days.  ? ?Review of Systems  ?Constitutional:  Negative for fever.  ?Gastrointestinal:  Positive for diarrhea. Negative for blood in stool, constipation, nausea and vomiting.  ?Genitourinary:  Negative for dyspareunia, dysuria, flank pain, frequency, hematuria, urgency, vaginal bleeding, vaginal discharge and vaginal pain.  ?Musculoskeletal:  Negative for back pain.  ?Skin:  Negative for rash.  ? ?Physical Exam:  ?BP 100/70   Ht 5\' 8"  (1.727 m)   Wt 187 lb (84.8 kg)   BMI 28.43 kg/m?  Body mass index is 28.43 kg/m?. ? ?Pelvic exam:  ?Two IUD strings present seen coming from the cervical os. ?EGBUS, vaginal vault and cervix: within normal limits ? ? ?Assessment:  ? ?Encounter for routine checking of intrauterine contraceptive device (IUD) ? ?IUD strings present in proper location; pt doing well ? ?Plan: ?F/u if any signs of infection or can no longer feel the strings.  ? ?Edgerrin Correia B. Akari Defelice, PA-C ?02/11/2022 ?9:04 AM ?

## 2022-02-11 ENCOUNTER — Encounter: Payer: Self-pay | Admitting: Obstetrics and Gynecology

## 2022-02-11 ENCOUNTER — Ambulatory Visit: Payer: BC Managed Care – PPO | Admitting: Obstetrics and Gynecology

## 2022-02-11 VITALS — BP 100/70 | Ht 68.0 in | Wt 187.0 lb

## 2022-02-11 DIAGNOSIS — Z30431 Encounter for routine checking of intrauterine contraceptive device: Secondary | ICD-10-CM | POA: Diagnosis not present

## 2022-02-14 ENCOUNTER — Encounter: Payer: Self-pay | Admitting: Nurse Practitioner

## 2022-02-15 ENCOUNTER — Encounter: Payer: Self-pay | Admitting: Family Medicine

## 2022-02-15 ENCOUNTER — Ambulatory Visit: Payer: BC Managed Care – PPO | Admitting: Family Medicine

## 2022-02-15 VITALS — BP 127/74 | HR 91 | Temp 98.4°F | Wt 186.6 lb

## 2022-02-15 DIAGNOSIS — R197 Diarrhea, unspecified: Secondary | ICD-10-CM | POA: Diagnosis not present

## 2022-02-15 MED ORDER — DICYCLOMINE HCL 10 MG PO CAPS: 10.0000 mg | ORAL_CAPSULE | Freq: Three times a day (TID) | ORAL | 2 refills | Status: DC

## 2022-02-15 NOTE — Progress Notes (Signed)
? ?BP 127/74   Pulse 91   Temp 98.4 ?F (36.9 ?C)   Wt 186 lb 9.6 oz (84.6 kg)   SpO2 97%   BMI 28.37 kg/m?   ? ?Subjective:  ? ? Patient ID: Elizabeth Case, female    DOB: 01-11-1971, 51 y.o.   MRN: [id number] ? ?HPI: ?Elizabeth Case is a 51 y.o. female ? ?Chief Complaint  ?Patient presents with  ? Diarrhea  ?  Patient states she is having loose stools for a week with mucus. Patient states she occassionally has sharp pains off and on and really bad gas. Patient has tried gas-x and imodium. Patient states when she passes gas she has a BM    ? ?ABDOMINAL ISSUES ?Duration: about a week ?Nature: gassy, crampy, sharp pain ?Location: has moved all over  ?Severity: 7/10  ?Radiation: no ?Frequency: 2-3x a day now ?Alleviating factors: imodium, heating pad ?Aggravating factors: eating ?Treatments attempted: imodium ?Constipation: no ?Diarrhea: yes ?Episodes of diarrhea/day: 2-3x a day ?Mucous in the stool: yes ?Heartburn: no ?Bloating:yes ?Flatulence: yes ?Nausea: no ?Vomiting: no ?Melena: no ?Hematochezia: no ?Rash: no ?Jaundice: no ?Fever: no ?Weight loss: yes- with effort ? ? ?Relevant past medical, surgical, family and social history reviewed and updated as indicated. Interim medical history since our last visit reviewed. ?Allergies and medications reviewed and updated. ? ?Review of Systems  ?Constitutional: Negative.   ?Respiratory: Negative.    ?Cardiovascular: Negative.   ?Gastrointestinal:  Positive for abdominal distention, abdominal pain and diarrhea. Negative for anal bleeding, blood in stool, constipation, nausea, rectal pain and vomiting.  ?Musculoskeletal: Negative.   ?Psychiatric/Behavioral: Negative.    ? ?Per HPI unless specifically indicated above ? ?   ?Objective:  ?  ?BP 127/74   Pulse 91   Temp 98.4 ?F (36.9 ?C)   Wt 186 lb 9.6 oz (84.6 kg)   SpO2 97%   BMI 28.37 kg/m?   ?Wt Readings from Last 3 Encounters:  ?02/15/22 186 lb 9.6 oz (84.6 kg)  ?02/11/22 187 lb (84.8 kg)  ?02/02/22 193 lb  (87.5 kg)  ?  ?Physical Exam ?Vitals and nursing note reviewed.  ?Constitutional:   ?   General: She is not in acute distress. ?   Appearance: Normal appearance. She is not ill-appearing, toxic-appearing or diaphoretic.  ?HENT:  ?   Head: Normocephalic and atraumatic.  ?   Right Ear: External ear normal.  ?   Left Ear: External ear normal.  ?   Nose: Nose normal.  ?   Mouth/Throat:  ?   Mouth: Mucous membranes are moist.  ?   Pharynx: Oropharynx is clear.  ?Eyes:  ?   General: No scleral icterus.    ?   Right eye: No discharge.     ?   Left eye: No discharge.  ?   Extraocular Movements: Extraocular movements intact.  ?   Conjunctiva/sclera: Conjunctivae normal.  ?   Pupils: Pupils are equal, round, and reactive to light.  ?Cardiovascular:  ?   Rate and Rhythm: Normal rate and regular rhythm.  ?   Pulses: Normal pulses.  ?   Heart sounds: Normal heart sounds. No murmur heard. ?  No friction rub. No gallop.  ?Pulmonary:  ?   Effort: Pulmonary effort is normal. No respiratory distress.  ?   Breath sounds: Normal breath sounds. No stridor. No wheezing, rhonchi or rales.  ?Chest:  ?   Chest wall: No tenderness.  ?Abdominal:  ?   General: Abdomen is  flat. Bowel sounds are normal. There is no distension.  ?   Palpations: Abdomen is soft. There is no mass.  ?   Tenderness: There is abdominal tenderness. There is no right CVA tenderness, left CVA tenderness, guarding or rebound.  ?   Hernia: No hernia is present.  ?Musculoskeletal:     ?   General: Normal range of motion.  ?   Cervical back: Normal range of motion and neck supple.  ?Skin: ?   General: Skin is warm and dry.  ?   Capillary Refill: Capillary refill takes less than 2 seconds.  ?   Coloration: Skin is not jaundiced or pale.  ?   Findings: No bruising, erythema, lesion or rash.  ?Neurological:  ?   General: No focal deficit present.  ?   Mental Status: She is alert and oriented to person, place, and time. Mental status is at baseline.  ?Psychiatric:     ?   Mood  and Affect: Mood normal.     ?   Behavior: Behavior normal.     ?   Thought Content: Thought content normal.     ?   Judgment: Judgment normal.  ? ? ?Results for orders placed or performed in visit on 01/14/22  ?Urine Culture  ? Specimen: Urine, Clean Catch  ? UC  ?Result Value Ref Range  ? Urine Culture, Routine Final report (A)   ? Organism ID, Bacteria Escherichia coli (A)   ? Antimicrobial Susceptibility Comment   ?POCT Urinalysis Dipstick  ?Result Value Ref Range  ? Color, UA    ? Clarity, UA    ? Glucose, UA Negative Negative  ? Bilirubin, UA neg   ? Ketones, UA neg   ? Spec Grav, UA <=1.005 (A) 1.010 - 1.025  ? Blood, UA 3+   ? pH, UA 6.5 5.0 - 8.0  ? Protein, UA Positive (A) Negative  ? Urobilinogen, UA 0.2 0.2 or 1.0 E.U./dL  ? Nitrite, UA neg   ? Leukocytes, UA Large (3+) (A) Negative  ? Appearance    ? Odor    ? ?   ?Assessment & Plan:  ? ?Problem List Items Addressed This Visit   ?None ?Visit Diagnoses   ? ? Diarrhea, unspecified type    -  Primary  ? Concern for gastroenteritis. Will start bentyl and check labs. If not better tomorrow will get stool studies. Call if not getting better or getting worse.   ? Relevant Orders  ? Comprehensive metabolic panel  ? CBC with Differential/Platelet  ? Amylase  ? Lipase  ? ?  ?  ? ?Follow up plan: ?Return if symptoms worsen or fail to improve. ? ? ? ? ? ?

## 2022-02-16 ENCOUNTER — Encounter: Payer: Self-pay | Admitting: Family Medicine

## 2022-02-16 LAB — CBC WITH DIFFERENTIAL/PLATELET
Basophils Absolute: 0.1 10*3/uL (ref 0.0–0.2)
Basos: 1 %
EOS (ABSOLUTE): 0.1 10*3/uL (ref 0.0–0.4)
Eos: 1 %
Hematocrit: 43 % (ref 34.0–46.6)
Hemoglobin: 14.6 g/dL (ref 11.1–15.9)
Immature Grans (Abs): 0 10*3/uL (ref 0.0–0.1)
Immature Granulocytes: 0 %
Lymphocytes Absolute: 1.5 10*3/uL (ref 0.7–3.1)
Lymphs: 23 %
MCH: 30.4 pg (ref 26.6–33.0)
MCHC: 34 g/dL (ref 31.5–35.7)
MCV: 90 fL (ref 79–97)
Monocytes Absolute: 0.6 10*3/uL (ref 0.1–0.9)
Monocytes: 9 %
Neutrophils Absolute: 4.4 10*3/uL (ref 1.4–7.0)
Neutrophils: 66 %
Platelets: 250 10*3/uL (ref 150–450)
RBC: 4.8 x10E6/uL (ref 3.77–5.28)
RDW: 12.3 % (ref 11.7–15.4)
WBC: 6.6 10*3/uL (ref 3.4–10.8)

## 2022-02-16 LAB — COMPREHENSIVE METABOLIC PANEL
ALT: 14 IU/L (ref 0–32)
AST: 17 IU/L (ref 0–40)
Albumin/Globulin Ratio: 2.3 — ABNORMAL HIGH (ref 1.2–2.2)
Albumin: 4.6 g/dL (ref 3.8–4.8)
Alkaline Phosphatase: 61 IU/L (ref 44–121)
BUN/Creatinine Ratio: 14 (ref 9–23)
BUN: 9 mg/dL (ref 6–24)
Bilirubin Total: 0.3 mg/dL (ref 0.0–1.2)
CO2: 22 mmol/L (ref 20–29)
Calcium: 9.4 mg/dL (ref 8.7–10.2)
Chloride: 104 mmol/L (ref 96–106)
Creatinine, Ser: 0.66 mg/dL (ref 0.57–1.00)
Globulin, Total: 2 g/dL (ref 1.5–4.5)
Glucose: 96 mg/dL (ref 70–99)
Potassium: 3.4 mmol/L — ABNORMAL LOW (ref 3.5–5.2)
Sodium: 140 mmol/L (ref 134–144)
Total Protein: 6.6 g/dL (ref 6.0–8.5)
eGFR: 107 mL/min/{1.73_m2} (ref >59)

## 2022-02-16 LAB — LIPASE: Lipase: 25 U/L (ref 14–72)

## 2022-02-16 LAB — AMYLASE: Amylase: 40 U/L (ref 31–110)

## 2022-03-04 ENCOUNTER — Encounter: Payer: Self-pay | Admitting: Obstetrics and Gynecology

## 2022-03-04 ENCOUNTER — Other Ambulatory Visit: Payer: Self-pay | Admitting: Obstetrics and Gynecology

## 2022-03-04 MED ORDER — NITROFURANTOIN MONOHYD MACRO 100 MG PO CAPS: 100.0000 mg | ORAL_CAPSULE | Freq: Two times a day (BID) | ORAL | 0 refills | Status: AC

## 2022-03-04 MED ORDER — NITROFURANTOIN MONOHYD MACRO 100 MG PO CAPS: ORAL_CAPSULE | ORAL | 0 refills | Status: DC

## 2022-03-04 NOTE — Progress Notes (Signed)
Rx macrobid for UTI sx and then to take 1 after sex as preventive ?

## 2022-05-22 NOTE — Patient Instructions (Incomplete)
Please call to schedule your mammogram and/or bone density: [facility]: [id number], [place] [address], [place] [address] Phone: [phone]   Healthy Eating Following a healthy eating pattern may help you to achieve and maintain a healthy body weight, reduce the risk of chronic disease, and live a long and productive life. It is important to follow a healthy eating pattern at an appropriate calorie level for your body. Your nutritional needs should be met primarily through food by choosing a variety of nutrient-rich foods. What are tips for following this plan? Reading food labels Read labels and choose the following: Reduced or low sodium. Juices with 100% fruit juice. Foods with low saturated fats and high polyunsaturated and monounsaturated fats. Foods with whole grains, such as whole wheat, cracked wheat, brown rice, and wild rice. Whole grains that are fortified with folic acid. This is recommended for women who are pregnant or who want to become pregnant. Read labels and avoid the following: Foods with a lot of added sugars. These include foods that contain brown sugar, corn sweetener, corn syrup, dextrose, fructose, glucose, high-fructose corn syrup, honey, invert sugar, lactose, malt syrup, maltose, molasses, raw sugar, sucrose, trehalose, or turbinado sugar. Do not eat more than the following amounts of added sugar per day: 6 teaspoons (25 g) for women. 9 teaspoons (38 g) for men. Foods that contain processed or refined starches and grains. Refined grain products, such as white flour, degermed cornmeal, white bread, and white rice. Shopping Choose nutrient-rich snacks, such as vegetables, whole fruits, and nuts. Avoid high-calorie and high-sugar snacks, such as potato chips, fruit snacks, and candy. Use oil-based dressings and spreads on foods instead of solid fats such as butter, stick margarine, or cream cheese. Limit pre-made  sauces, mixes, and "instant" products such as flavored rice, instant noodles, and ready-made pasta. Try more plant-protein sources, such as tofu, tempeh, black beans, edamame, lentils, nuts, and seeds. Explore eating plans such as the Mediterranean diet or vegetarian diet. Cooking Use oil to saut or stir-fry foods instead of solid fats such as butter, stick margarine, or lard. Try baking, boiling, grilling, or broiling instead of frying. Remove the fatty part of meats before cooking. Steam vegetables in water or broth. Meal planning  At meals, imagine dividing your plate into fourths: One-half of your plate is fruits and vegetables. One-fourth of your plate is whole grains. One-fourth of your plate is protein, especially lean meats, poultry, eggs, tofu, beans, or nuts. Include low-fat dairy as part of your daily diet. Lifestyle Choose healthy options in all settings, including home, work, school, restaurants, or stores. Prepare your food safely: Wash your hands after handling raw meats. Keep food preparation surfaces clean by regularly washing with hot, soapy water. Keep raw meats separate from ready-to-eat foods, such as fruits and vegetables. Cook seafood, meat, poultry, and eggs to the recommended internal temperature. Store foods at safe temperatures. In general: Keep cold foods at 39F (4.4C) or below. Keep hot foods at 139F (60C) or above. Keep your freezer at [facility] (-17.8C) or below. Foods are no longer safe to eat when they have been between the temperatures of 40-139F (4.4-60C) for more than 2 hours. What foods should I eat? Fruits Aim to eat 2 cup-equivalents of fresh, canned (in natural juice), or frozen fruits each day. Examples of 1 cup-equivalent of fruit include 1 small apple, 8 large strawberries, 1 cup canned fruit,  cup dried fruit, or 1 cup 100% juice.  Vegetables Aim to eat 2-3 cup-equivalents of fresh and frozen vegetables each day, including different  varieties and colors. Examples of 1 cup-equivalent of vegetables include 2 medium carrots, 2 cups raw, leafy greens, 1 cup chopped vegetable (raw or cooked), or 1 medium baked potato. Grains Aim to eat 6 ounce-equivalents of whole grains each day. Examples of 1 ounce-equivalent of grains include 1 slice of bread, 1 cup ready-to-eat cereal, 3 cups popcorn, or  cup cooked rice, pasta, or cereal. Meats and other proteins Aim to eat 5-6 ounce-equivalents of protein each day. Examples of 1 ounce-equivalent of protein include 1 egg, 1/2 cup nuts or seeds, or 1 tablespoon (16 g) peanut butter. A cut of meat or fish that is the size of a deck of cards is about 3-4 ounce-equivalents. Of the protein you eat each week, try to have at least 8 ounces come from seafood. This includes salmon, trout, herring, and anchovies. Dairy Aim to eat 3 cup-equivalents of fat-free or low-fat dairy each day. Examples of 1 cup-equivalent of dairy include 1 cup (240 mL) milk, 8 ounces (250 g) yogurt, 1 ounces (44 g) natural cheese, or 1 cup (240 mL) fortified soy milk. Fats and oils Aim for about 5 teaspoons (21 g) per day. Choose monounsaturated fats, such as canola and olive oils, avocados, peanut butter, and most nuts, or polyunsaturated fats, such as sunflower, corn, and soybean oils, walnuts, pine nuts, sesame seeds, sunflower seeds, and flaxseed. Beverages Aim for six 8-oz glasses of water per day. Limit coffee to three to five 8-oz cups per day. Limit caffeinated beverages that have added calories, such as soda and energy drinks. Limit alcohol intake to no more than 1 drink a day for nonpregnant women and 2 drinks a day for men. One drink equals 12 oz of beer (355 mL), 5 oz of wine (148 mL), or 1 oz of hard liquor (44 mL). Seasoning and other foods Avoid adding excess amounts of salt to your foods. Try flavoring foods with herbs and spices instead of salt. Avoid adding sugar to foods. Try using oil-based dressings,  sauces, and spreads instead of solid fats. This information is based on general [place] nutrition guidelines. For more information, visit [employer]. Exact amounts may vary based on your nutrition needs. Summary A healthy eating plan may help you to maintain a healthy weight, reduce the risk of chronic diseases, and stay active throughout your life. Plan your meals. Make sure you eat the right portions of a variety of nutrient-rich foods. Try baking, boiling, grilling, or broiling instead of frying. Choose healthy options in all settings, including home, work, school, restaurants, or stores. This information is not intended to replace advice given to you by your health care provider. Make sure you discuss any questions you have with your health care provider. Document Revised: 06/16/2021 Document Reviewed: 06/16/2021 Elsevier Patient Education  [employer].

## 2022-05-24 ENCOUNTER — Encounter: Payer: Self-pay | Admitting: Nurse Practitioner

## 2022-05-24 ENCOUNTER — Ambulatory Visit (INDEPENDENT_AMBULATORY_CARE_PROVIDER_SITE_OTHER): Payer: BC Managed Care – PPO | Admitting: Nurse Practitioner

## 2022-05-24 VITALS — BP 101/66 | HR 91 | Temp 97.6°F | Ht 68.0 in | Wt 176.7 lb

## 2022-05-24 DIAGNOSIS — I493 Ventricular premature depolarization: Secondary | ICD-10-CM

## 2022-05-24 DIAGNOSIS — H6121 Impacted cerumen, right ear: Secondary | ICD-10-CM | POA: Diagnosis not present

## 2022-05-24 DIAGNOSIS — L2084 Intrinsic (allergic) eczema: Secondary | ICD-10-CM

## 2022-05-24 DIAGNOSIS — Z0000 Encounter for general adult medical examination without abnormal findings: Secondary | ICD-10-CM | POA: Diagnosis not present

## 2022-05-24 DIAGNOSIS — Z1231 Encounter for screening mammogram for malignant neoplasm of breast: Secondary | ICD-10-CM | POA: Diagnosis not present

## 2022-05-24 MED ORDER — KETOCONAZOLE 2 % EX SHAM: 1.0000 | MEDICATED_SHAMPOO | CUTANEOUS | 4 refills | Status: DC

## 2022-05-24 NOTE — Assessment & Plan Note (Signed)
Ongoing.  Followed by cardiology.  Occasionally notices these.  Did not tolerate beta blocker therapy.  Continue to monitor and continue collaboration with cardiology.  Lipid panel and TSH today.  May benefit from a PRN Propranolol.

## 2022-05-24 NOTE — Assessment & Plan Note (Signed)
Chronic, stable.  Will continue current medication regimen which is offering benefit.  If ongoing discussed possible need for referral to dermatology and use of monoclonal antibody treatment.

## 2022-05-24 NOTE — Progress Notes (Signed)
BP 101/66   Pulse 91   Temp 97.6 F (36.4 C) (Oral)   Ht '5\' 8"'  (1.727 m)   Wt 176 lb 11.2 oz (80.2 kg)   SpO2 98%   BMI 26.87 kg/m    Subjective:    Patient ID: Elizabeth Case, female    DOB: 05/30/1971, 51 y.o.   MRN: [id number]  HPI: Elizabeth Case is a 51 y.o. female presenting on 05/24/2022 for comprehensive medical examination. Current medical complaints include:none  She currently lives with: husband Menopausal Symptoms: no  PALPITATIONS Noticed them more recently.  Last saw cardiology 02/02/22.  Was on BB, but this caused hypotension -- he felt bad with this.  Having echo upcoming. Aspirin: no Recurrent headaches: no Visual changes: no Palpitations: yes Dyspnea: no Chest pain: no Lower extremity edema: no Dizzy/lightheaded: no   Depression Screen done today and results listed below:     05/24/2022    8:14 AM 02/15/2022    4:28 PM 05/21/2021    8:13 AM 04/01/2021    9:00 AM 05/08/2020    2:33 PM  Depression screen PHQ 2/9  Decreased Interest 0 0 0 0 0  Down, Depressed, Hopeless 0 0 3 0 0  PHQ - 2 Score 0 0 3 0 0  Altered sleeping 1 0 3  0  Tired, decreased energy 0 0 3  0  Change in appetite 0 0 0  0  Feeling bad or failure about yourself  0 0 0  0  Trouble concentrating 0 0 0  0  Moving slowly or fidgety/restless 0 0 0  0  Suicidal thoughts 0 0 0  0  PHQ-9 Score 1 0 9  0  Difficult doing work/chores Not difficult at all  Somewhat difficult  Not difficult at all    The patient does not have a history of falls. I did not complete a risk assessment for falls. A plan of care for falls was not documented.  Functional Status Survey: Is the patient deaf or have difficulty hearing?: No Does the patient have difficulty seeing, even when wearing glasses/contacts?: No Does the patient have difficulty concentrating, remembering, or making decisions?: No Does the patient have difficulty walking or climbing stairs?: No Does the patient have difficulty dressing or  bathing?: No Does the patient have difficulty doing errands alone such as visiting a doctor's office or shopping?: No   Past Medical History:  Past Medical History:  Diagnosis Date   Allergic rhinitis    Eczema    GERD (gastroesophageal reflux disease)    Migraines    1x/mo   Vitamin D deficiency disease     Surgical History:  Past Surgical History:  Procedure Laterality Date   BREAST BIOPSY     BREAST EXCISIONAL BIOPSY Right    BREAST REDUCTION SURGERY  1994   BREAST SURGERY Right 2012   lumpectomy   CHOLECYSTECTOMY  1998   COLONOSCOPY WITH PROPOFOL N/A 09/08/2016   Procedure: COLONOSCOPY WITH PROPOFOL;  Surgeon: Jonathon Bellows, MD;  Location: [facility];  Service: Endoscopy;  Laterality: N/A;  Latex sensitivity   REDUCTION MAMMAPLASTY Bilateral 1994    Medications:  Current Outpatient Medications on File Prior to Visit  Medication Sig   Crisaborole (EUCRISA) 2 % OINT Apply a thin film to affected area(s) 2 times daily.   dicyclomine (BENTYL) 10 MG capsule Take 1 capsule (10 mg total) by mouth 4 (four) times daily -  before meals and at bedtime.   levonorgestrel (  MIRENA) 20 MCG/DAY IUD 1 each by Intrauterine route once for 1 dose.   Multiple Vitamin (MULTIVITAMIN) tablet Take 1 tablet by mouth daily.   nitrofurantoin, macrocrystal-monohydrate, (MACROBID) 100 MG capsule Take 100 mg by mouth as directed. Take 1 tab after sex as preventive   triamcinolone (KENALOG) 0.025 % cream Apply 1 application topically 2 (two) times daily.   No current facility-administered medications on file prior to visit.    Allergies:  Allergies  Allergen Reactions   Kiwi Extract Shortness Of Breath   Latex Dermatitis   Other Swelling    Walnuts - angioedema    Social History:  Social History   Socioeconomic History   Marital status: Married    Spouse name: Audelia Acton   Number of children: 2   Years of education: 16   Highest education level: Bachelor's degree (e.g., BA, AB, BS)   Occupational History   Not on file  Tobacco Use   Smoking status: Never   Smokeless tobacco: Never  Vaping Use   Vaping Use: Never used  Substance and Sexual Activity   Alcohol use: Yes    Alcohol/week: 0.0 standard drinks of alcohol    Comment: holidays   Drug use: No   Sexual activity: Yes    Birth control/protection: I.U.D.    Comment: Mirena  Other Topics Concern   Not on file  Social History Narrative   Not on file   Social Determinants of Health   Financial Resource Strain: Low Risk  (05/01/2019)   Overall Financial Resource Strain (CARDIA)    Difficulty of Paying Living Expenses: Not hard at all  Food Insecurity: No Food Insecurity (05/01/2019)   Hunger Vital Sign    Worried About Running Out of Food in the Last Year: Never true    Ran Out of Food in the Last Year: Never true  Transportation Needs: No Transportation Needs (05/01/2019)   PRAPARE - Hydrologist (Medical): No    Lack of Transportation (Non-Medical): No  Physical Activity: Inactive (05/01/2019)   Exercise Vital Sign    Days of Exercise per Week: 0 days    Minutes of Exercise per Session: 0 min  Stress: Stress Concern Present (05/01/2019)   [place]    Feeling of Stress : To some extent  Social Connections: Moderately Integrated (05/01/2019)   Social Connection and Isolation Panel [NHANES]    Frequency of Communication with Friends and Family: More than three times a week    Frequency of Social Gatherings with Friends and Family: Once a week    Attends Religious Services: More than 4 times per year    Active Member of [employer] or Organizations: No    Attends Archivist Meetings: Never    Marital Status: Married  Human resources officer Violence: Not At Risk (05/01/2019)   Humiliation, Afraid, Rape, and Kick questionnaire    Fear of Current or Ex-Partner: No    Emotionally Abused: No    Physically Abused: No     Sexually Abused: No   Social History   Tobacco Use  Smoking Status Never  Smokeless Tobacco Never   Social History   Substance and Sexual Activity  Alcohol Use Yes   Alcohol/week: 0.0 standard drinks of alcohol   Comment: holidays    Family History:  Family History  Problem Relation Age of Onset   Hypertension Mother    Pulmonary fibrosis Mother    Hypertension Father  Cancer Father 20       prostate    Stroke Maternal Grandmother    Cancer Paternal Grandmother        breast   Breast cancer Paternal Grandmother    Stroke Paternal Grandfather        cerebral aneurysm caused the stroke   Aneurysm Paternal Grandfather    Diabetes Neg Hx    Heart disease Neg Hx     Past medical history, surgical history, medications, allergies, family history and social history reviewed with patient today and changes made to appropriate areas of the chart.   ROS All other ROS negative except what is listed above and in the HPI.      Objective:    BP 101/66   Pulse 91   Temp 97.6 F (36.4 C) (Oral)   Ht '5\' 8"'  (1.727 m)   Wt 176 lb 11.2 oz (80.2 kg)   SpO2 98%   BMI 26.87 kg/m   Wt Readings from Last 3 Encounters:  05/24/22 176 lb 11.2 oz (80.2 kg)  02/15/22 186 lb 9.6 oz (84.6 kg)  02/11/22 187 lb (84.8 kg)    Physical Exam Vitals and nursing note reviewed. Exam conducted with a chaperone present.  Constitutional:      General: She is awake. She is not in acute distress.    Appearance: She is well-developed and well-groomed. She is not ill-appearing or toxic-appearing.  HENT:     Head: Normocephalic and atraumatic.     Right Ear: Hearing, ear canal and external ear normal. No drainage. There is impacted cerumen.     Left Ear: Hearing, tympanic membrane, ear canal and external ear normal. No drainage.     Ears:     Comments: Right ear with impacted cerumen. Irrigated by CMA with full clearance. Able to view TM after irrigation.  Tolerated well.    Nose: Nose normal.      Right Sinus: No maxillary sinus tenderness or frontal sinus tenderness.     Left Sinus: No maxillary sinus tenderness or frontal sinus tenderness.     Mouth/Throat:     Mouth: Mucous membranes are moist.     Pharynx: Oropharynx is clear. Uvula midline. No pharyngeal swelling, oropharyngeal exudate or posterior oropharyngeal erythema.  Eyes:     General: Lids are normal.        Right eye: No discharge.        Left eye: No discharge.     Extraocular Movements: Extraocular movements intact.     Conjunctiva/sclera: Conjunctivae normal.     Pupils: Pupils are equal, round, and reactive to light.     Visual Fields: Right eye visual fields normal and left eye visual fields normal.  Neck:     Thyroid: No thyromegaly.     Vascular: No carotid bruit.     Trachea: Trachea normal.  Cardiovascular:     Rate and Rhythm: Normal rate and regular rhythm.     Heart sounds: Normal heart sounds. No murmur heard.    No gallop.  Pulmonary:     Effort: Pulmonary effort is normal. No accessory muscle usage or respiratory distress.     Breath sounds: Normal breath sounds.  Abdominal:     General: Bowel sounds are normal.     Palpations: Abdomen is soft. There is no hepatomegaly or splenomegaly.     Tenderness: There is no abdominal tenderness.  Musculoskeletal:        General: Normal range of motion.  Cervical back: Normal range of motion and neck supple.     Right lower leg: No edema.     Left lower leg: No edema.  Lymphadenopathy:     Head:     Right side of head: No submental, submandibular, tonsillar, preauricular or posterior auricular adenopathy.     Left side of head: No submental, submandibular, tonsillar, preauricular or posterior auricular adenopathy.     Cervical: No cervical adenopathy.  Skin:    General: Skin is warm and dry.     Capillary Refill: Capillary refill takes less than 2 seconds.     Findings: No rash.  Neurological:     Mental Status: She is alert and oriented to  person, place, and time.     Gait: Gait is intact.     Deep Tendon Reflexes: Reflexes are normal and symmetric.     Reflex Scores:      Brachioradialis reflexes are 2+ on the right side and 2+ on the left side.      Patellar reflexes are 2+ on the right side and 2+ on the left side. Psychiatric:        Attention and Perception: Attention normal.        Mood and Affect: Mood normal.        Speech: Speech normal.        Behavior: Behavior normal. Behavior is cooperative.        Thought Content: Thought content normal.        Judgment: Judgment normal.    Results for orders placed or performed in visit on 02/15/22  Comprehensive metabolic panel  Result Value Ref Range   Glucose 96 70 - 99 mg/dL   BUN 9 6 - 24 mg/dL   Creatinine, Ser 0.66 0.57 - 1.00 mg/dL   eGFR 107 >59 mL/min/1.73   BUN/Creatinine Ratio 14 9 - 23   Sodium 140 134 - 144 mmol/L   Potassium 3.4 (L) 3.5 - 5.2 mmol/L   Chloride 104 96 - 106 mmol/L   CO2 22 20 - 29 mmol/L   Calcium 9.4 8.7 - 10.2 mg/dL   Total Protein 6.6 6.0 - 8.5 g/dL   Albumin 4.6 3.8 - 4.8 g/dL   Globulin, Total 2.0 1.5 - 4.5 g/dL   Albumin/Globulin Ratio 2.3 (H) 1.2 - 2.2   Bilirubin Total 0.3 0.0 - 1.2 mg/dL   Alkaline Phosphatase 61 44 - 121 IU/L   AST 17 0 - 40 IU/L   ALT 14 0 - 32 IU/L  CBC with Differential/Platelet  Result Value Ref Range   WBC 6.6 3.4 - 10.8 x10E3/uL   RBC 4.80 3.77 - 5.28 x10E6/uL   Hemoglobin 14.6 11.1 - 15.9 g/dL   Hematocrit 43.0 34.0 - 46.6 %   MCV 90 79 - 97 fL   MCH 30.4 26.6 - 33.0 pg   MCHC 34.0 31.5 - 35.7 g/dL   RDW 12.3 11.7 - 15.4 %   Platelets 250 150 - 450 x10E3/uL   Neutrophils 66 Not Estab. %   Lymphs 23 Not Estab. %   Monocytes 9 Not Estab. %   Eos 1 Not Estab. %   Basos 1 Not Estab. %   Neutrophils Absolute 4.4 1.4 - 7.0 x10E3/uL   Lymphocytes Absolute 1.5 0.7 - 3.1 x10E3/uL   Monocytes Absolute 0.6 0.1 - 0.9 x10E3/uL   EOS (ABSOLUTE) 0.1 0.0 - 0.4 x10E3/uL   Basophils Absolute 0.1 0.0 -  0.2 x10E3/uL   Immature Granulocytes 0 Not Estab. %  Immature Grans (Abs) 0.0 0.0 - 0.1 x10E3/uL  Amylase  Result Value Ref Range   Amylase 40 31 - 110 U/L  Lipase  Result Value Ref Range   Lipase 25 14 - 72 U/L      Assessment & Plan:   Problem List Items Addressed This Visit       Cardiovascular and Mediastinum   Frequent unifocal PVCs - Primary    Ongoing.  Followed by cardiology.  Occasionally notices these.  Did not tolerate beta blocker therapy.  Continue to monitor and continue collaboration with cardiology.  Lipid panel and TSH today.  May benefit from a PRN Propranolol.      Relevant Orders   CBC with Differential/Platelet   Comprehensive metabolic panel   Lipid Panel w/o Chol/HDL Ratio   TSH     Musculoskeletal and Integument   Eczema    Chronic, stable.  Will continue current medication regimen which is offering benefit.  If ongoing discussed possible need for referral to dermatology and use of monoclonal antibody treatment.      Other Visit Diagnoses     Impacted cerumen of right ear       To right ear, initially unable to view TM.  Irrigated by [employer] and able to view TM well after procedure.  Tolerated well.   Encounter for screening mammogram for malignant neoplasm of breast       Mammogram ordered.   Relevant Orders   MM 3D SCREEN BREAST BILATERAL   Encounter for annual physical exam       Annual physical today with labs and health maintenance reviewed, discussed with patient.   Relevant Orders   Comprehensive metabolic panel   TSH        Follow up plan: Return in about 1 year (around 05/25/2023) for Annual physical.   LABORATORY TESTING:  - Pap smear: pap done  IMMUNIZATIONS:   - Tdap: Tetanus vaccination status reviewed: last tetanus booster within 10 years. - Influenza: Up to date - Pneumovax: Not applicable - Prevnar: Not applicable - COVID: Up to date - HPV: Not applicable - Shingrix vaccine:  will think about  this  SCREENING: -Mammogram: Up to date  - Colonoscopy: Up to date  - Bone Density: Not applicable  -Hearing Test: Not applicable  -Spirometry: Not applicable   PATIENT COUNSELING:   Advised to take 1 mg of folate supplement per day if capable of pregnancy.   Sexuality: Discussed sexually transmitted diseases, partner selection, use of condoms, avoidance of unintended pregnancy  and contraceptive alternatives.   Advised to avoid cigarette smoking.  I discussed with the patient that most people either abstain from alcohol or drink within safe limits (<=14/week and <=4 drinks/occasion for males, <=7/weeks and <= 3 drinks/occasion for females) and that the risk for alcohol disorders and other health effects rises proportionally with the number of drinks per week and how often a drinker exceeds daily limits.  Discussed cessation/primary prevention of drug use and availability of treatment for abuse.   Diet: Encouraged to adjust caloric intake to maintain  or achieve ideal body weight, to reduce intake of dietary saturated fat and total fat, to limit sodium intake by avoiding high sodium foods and not adding table salt, and to maintain adequate dietary potassium and calcium preferably from fresh fruits, vegetables, and low-fat dairy products.    Stressed the importance of regular exercise  Injury prevention: Discussed safety belts, safety helmets, smoke detector, smoking near bedding or upholstery.   Dental health:  Discussed importance of regular tooth brushing, flossing, and dental visits.    NEXT PREVENTATIVE PHYSICAL DUE IN 1 YEAR. Return in about 1 year (around 05/25/2023) for Annual physical.

## 2022-05-25 LAB — COMPREHENSIVE METABOLIC PANEL
ALT: 7 IU/L (ref 0–32)
AST: 16 IU/L (ref 0–40)
Albumin/Globulin Ratio: 2.1 (ref 1.2–2.2)
Albumin: 4.4 g/dL (ref 3.9–4.9)
Alkaline Phosphatase: 72 IU/L (ref 44–121)
BUN/Creatinine Ratio: 14 (ref 9–23)
BUN: 8 mg/dL (ref 6–24)
Bilirubin Total: 0.3 mg/dL (ref 0.0–1.2)
CO2: 24 mmol/L (ref 20–29)
Calcium: 9.1 mg/dL (ref 8.7–10.2)
Chloride: 102 mmol/L (ref 96–106)
Creatinine, Ser: 0.59 mg/dL (ref 0.57–1.00)
Globulin, Total: 2.1 g/dL (ref 1.5–4.5)
Glucose: 85 mg/dL (ref 70–99)
Potassium: 4 mmol/L (ref 3.5–5.2)
Sodium: 137 mmol/L (ref 134–144)
Total Protein: 6.5 g/dL (ref 6.0–8.5)
eGFR: 110 mL/min/{1.73_m2} (ref >59)

## 2022-05-25 LAB — TSH: TSH: 3.33 u[IU]/mL (ref 0.450–4.500)

## 2022-05-25 LAB — CBC WITH DIFFERENTIAL/PLATELET
Basophils Absolute: 0.1 10*3/uL (ref 0.0–0.2)
Basos: 1 %
EOS (ABSOLUTE): 0.1 10*3/uL (ref 0.0–0.4)
Eos: 1 %
Hematocrit: 45 % (ref 34.0–46.6)
Hemoglobin: 14.7 g/dL (ref 11.1–15.9)
Immature Grans (Abs): 0 10*3/uL (ref 0.0–0.1)
Immature Granulocytes: 0 %
Lymphocytes Absolute: 1.3 10*3/uL (ref 0.7–3.1)
Lymphs: 20 %
MCH: 29.9 pg (ref 26.6–33.0)
MCHC: 32.7 g/dL (ref 31.5–35.7)
MCV: 92 fL (ref 79–97)
Monocytes Absolute: 0.5 10*3/uL (ref 0.1–0.9)
Monocytes: 7 %
Neutrophils Absolute: 4.6 10*3/uL (ref 1.4–7.0)
Neutrophils: 71 %
Platelets: 236 10*3/uL (ref 150–450)
RBC: 4.92 x10E6/uL (ref 3.77–5.28)
RDW: 12.6 % (ref 11.7–15.4)
WBC: 6.6 10*3/uL (ref 3.4–10.8)

## 2022-05-25 LAB — LIPID PANEL W/O CHOL/HDL RATIO
Cholesterol, Total: 151 mg/dL (ref 100–199)
HDL: 55 mg/dL (ref >39)
LDL Chol Calc (NIH): 85 mg/dL (ref 0–99)
Triglycerides: 49 mg/dL (ref 0–149)
VLDL Cholesterol Cal: 11 mg/dL (ref 5–40)

## 2022-05-25 NOTE — Progress Notes (Signed)
Contacted via MyChart    Good afternoon Jasiyah, all I have to say is your labs are super duper fabulous!!  Could not ask for better!!  Keep up the good work.  Any questions on these? Keep being amazing!!  Thank you for allowing me to participate in your care.  I appreciate you. Kindest regards, Eathon Valade

## 2022-06-30 ENCOUNTER — Encounter: Payer: Self-pay | Admitting: Nurse Practitioner

## 2022-07-14 ENCOUNTER — Ambulatory Visit: Payer: Self-pay | Admitting: *Deleted

## 2022-07-14 NOTE — Telephone Encounter (Signed)
Scheduled for tomorrow.

## 2022-07-14 NOTE — Telephone Encounter (Signed)
  Chief Complaint: Rash Symptoms: Localized, facial, eyes bridge of nose worse. Dry patches, burning sensation Frequency: 06/14/22 Worsening past few days Pertinent Negatives: Patient denies fever, body aches Disposition: [] ED /[] Urgent Care (no appt availability in office) / [x] Appointment(In office/virtual)/ []  [facility]/ [] Home Care/ [] Refused Recommended Disposition /[]  Mobile Bus/ []  Follow-up with PCP Additional Notes: CAlled practice, Alexis for possible virtual today with Jolene. Will send to practice for review and final disposition. Pt has active MyChart account. States will send picture. Secured appt for tomorrow afternoon with as well in case cannot have virtual today. Reason for Disposition  [1] Localized rash is very painful AND [2] no fever  Answer Assessment - Initial Assessment Questions 1. APPEARANCE of RASH: "Describe the rash."      AT eye, dry, dark and scaly.Burning 2. LOCATION: "Where is the rash located?"      Face, mouth, left eye, right hand 3. NUMBER: "How many spots are there?"      Patches 4. SIZE: "How big are the spots?" (Inches, centimeters or compare to size of a coin)       5. ONSET: "When did the rash start?"      06/14/22, worsening last day or so 6. ITCHING: "Does the rash itch?" If Yes, ask: "How bad is the itch?"  (Scale 0-10; or none, mild, moderate, severe)     Burning 7. PAIN: "Does the rash hurt?" If Yes, ask: "How bad is the pain?"  (Scale 0-10; or none, mild, moderate, severe)    - NONE (0): no pain    - MILD (1-3): doesn't interfere with normal activities     - MODERATE (4-7): interferes with normal activities or awakens from sleep     - SEVERE (8-10): excruciating pain, unable to do any normal activities     Burning 8. OTHER SYMPTOMS: "Do you have any other symptoms?" (e.g., fever)     NO  Protocols used: Rash or Redness - Localized-A-AH

## 2022-07-15 ENCOUNTER — Ambulatory Visit: Payer: BC Managed Care – PPO | Admitting: Physician Assistant

## 2022-07-15 ENCOUNTER — Encounter: Payer: Self-pay | Admitting: Physician Assistant

## 2022-07-15 VITALS — BP 103/64 | HR 69 | Temp 98.8°F | Ht 67.99 in | Wt 177.6 lb

## 2022-07-15 DIAGNOSIS — L309 Dermatitis, unspecified: Secondary | ICD-10-CM

## 2022-07-15 MED ORDER — DESONIDE 0.05 % EX CREA: TOPICAL_CREAM | Freq: Two times a day (BID) | CUTANEOUS | 0 refills | Status: DC

## 2022-07-15 MED ORDER — PREDNISONE 10 MG PO TABS: ORAL_TABLET | ORAL | 0 refills | Status: DC

## 2022-07-15 NOTE — Progress Notes (Signed)
Established Patient Office Visit  Name: Elizabeth Case   MRN: [id number]    DOB: 1971/02/04   Date:07/15/2022  Today's Provider: Talitha Givens, MHS, PA-C Introduced myself to the patient as a PA-C and provided education on APPs in clinical practice.         Subjective  Chief Complaint  Chief Complaint  Patient presents with   Rash    Rash started about a month ago, has gotten worse this week.    Rash Pertinent negatives include no fever or shortness of breath.    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   First degree hemorrhoids    Allergic rhinitis    Migraines    Eczema     Past Surgical History:  Procedure Laterality Date   BREAST BIOPSY     BREAST EXCISIONAL BIOPSY Right    BREAST REDUCTION SURGERY  1994   BREAST SURGERY Right 2012   lumpectomy   CHOLECYSTECTOMY  1998   COLONOSCOPY WITH PROPOFOL N/A 09/08/2016   Procedure: COLONOSCOPY WITH PROPOFOL;  Surgeon: Jonathon Bellows, MD;  Location: [place];  Service: Endoscopy;  Laterality: N/A;  Latex sensitivity   REDUCTION MAMMAPLASTY Bilateral 1994    Family History  Problem Relation Age of Onset   Hypertension Mother    Pulmonary fibrosis Mother    Hypertension Father    Cancer Father 2       prostate    Stroke Maternal Grandmother    Cancer Paternal Grandmother        breast   Breast cancer Paternal  Grandmother    Stroke Paternal Grandfather        cerebral aneurysm caused the stroke   Aneurysm Paternal Grandfather    Diabetes Neg Hx    Heart disease Neg Hx     Social History   Tobacco Use   Smoking status: Never   Smokeless tobacco: Never  Substance Use Topics   Alcohol use: Yes    Alcohol/week: 0.0 standard drinks of alcohol    Comment: holidays     Current Outpatient Medications:    Crisaborole (EUCRISA) 2 % OINT, Apply a thin film to affected area(s) 2 times daily., Disp: 100 g, Rfl: 4   desonide (DESOWEN) 0.05 % cream, Apply topically 2 (two) times daily., Disp: 30 g, Rfl: 0   dicyclomine (BENTYL) 10 MG capsule, Take 1 capsule (10 mg total) by mouth 4 (four) times daily -  before meals and at bedtime., Disp: 120 capsule, Rfl: 2   ketoconazole (NIZORAL) 2 % shampoo, Apply 1 Application topically 2 (two) times a week., Disp: 120 mL, Rfl: 4   levonorgestrel (MIRENA) 20 MCG/DAY IUD, 1 each by Intrauterine route once for 1 dose., Disp: 1 each, Rfl: 0   Multiple Vitamin (MULTIVITAMIN) tablet, Take 1 tablet by mouth daily., Disp: , Rfl:    predniSONE (DELTASONE) 10 MG tablet, Take 48m  PO daily x4d, then 53m daily x4d, then 219mdaily x4d, then 1038maily x4 d, then 5mg83mily x4d, Disp: 42 tablet, Rfl: 0   triamcinolone (KENALOG) 0.025 % cream, Apply 1 application topically 2 (two) times daily., Disp: 30 g, Rfl: 5  Allergies  Allergen Reactions   Kiwi Extract Shortness Of Breath   Latex Dermatitis   Other Swelling    Walnuts - angioedema    I personally reviewed active problem list, medication list, allergies, notes from last encounter, lab results with the patient/caregiver today.   Review of Systems  Constitutional:  Negative for fever.  Respiratory:  Negative for shortness of breath and wheezing.   Cardiovascular:  Negative for chest pain and leg swelling.  Skin:  Positive for rash.      Objective  Vitals:   07/15/22 1410  BP: 103/64  Pulse: 69  Temp:  98.8 F (37.1 C)  TempSrc: Oral  SpO2: 96%  Weight: 177 lb 9.6 oz (80.6 kg)  Height: 5' 7.99" (1.727 m)    Body mass index is 27.01 kg/m.  Physical Exam Vitals reviewed.  Constitutional:      General: She is awake.     Appearance: Normal appearance. She is well-developed, well-groomed and normal weight.  HENT:     Head: Normocephalic and atraumatic.  Eyes:     General: Gaze aligned appropriately.     Extraocular Movements: Extraocular movements intact.     Conjunctiva/sclera: Conjunctivae normal.     Pupils: Pupils are equal, round, and reactive to light.     Comments: Redness, scaling rash observed around right orbit   Skin:    General: Skin is warm.     Findings: Erythema and rash present. Rash is scaling.     Comments: Erythematous, scaling rash observed on right side of face and left hand/ palm Rash appear thick around right ocular orbit  Neurological:     General: No focal deficit present.     Mental Status: She is alert and oriented to person, place, and time.     GCS: GCS eye subscore is 4. GCS verbal subscore is 5. GCS motor subscore is 6.     Cranial Nerves: Cranial nerves 2-12 are intact. No dysarthria or facial asymmetry.     Gait: Gait is intact.  Psychiatric:        Attention and Perception: Attention and perception normal.        Mood and Affect: Mood and affect normal.        Speech: Speech normal.        Behavior: Behavior normal. Behavior is cooperative.      Recent Results (from the past 2160 hour(s))  CBC with Differential/Platelet     Status: None   Collection Time: 05/24/22  8:47 AM  Result Value Ref Range   WBC 6.6 3.4 - 10.8 x10E3/uL   RBC 4.92 3.77 - 5.28 x10E6/uL   Hemoglobin 14.7 11.1 - 15.9 g/dL   Hematocrit 45.0 34.0 - 46.6 %   MCV 92 79 - 97 fL   MCH 29.9 26.6 - 33.0 pg   MCHC 32.7 31.5 - 35.7 g/dL   RDW 12.6 11.7 - 15.4 %   Platelets 236 150 - 450 x10E3/uL   Neutrophils 71 Not Estab. %   Lymphs 20 Not Estab. %   Monocytes 7 Not  Estab. %   Eos 1 Not Estab. %   Basos 1 Not Estab. %   Neutrophils Absolute 4.6 1.4 - 7.0 x10E3/uL  Lymphocytes Absolute 1.3 0.7 - 3.1 x10E3/uL   Monocytes Absolute 0.5 0.1 - 0.9 x10E3/uL   EOS (ABSOLUTE) 0.1 0.0 - 0.4 x10E3/uL   Basophils Absolute 0.1 0.0 - 0.2 x10E3/uL   Immature Granulocytes 0 Not Estab. %   Immature Grans (Abs) 0.0 0.0 - 0.1 x10E3/uL  Comprehensive metabolic panel     Status: None   Collection Time: 05/24/22  8:47 AM  Result Value Ref Range   Glucose 85 70 - 99 mg/dL   BUN 8 6 - 24 mg/dL   Creatinine, Ser 0.59 0.57 - 1.00 mg/dL   eGFR 110 >59 mL/min/1.73   BUN/Creatinine Ratio 14 9 - 23   Sodium 137 134 - 144 mmol/L   Potassium 4.0 3.5 - 5.2 mmol/L   Chloride 102 96 - 106 mmol/L   CO2 24 20 - 29 mmol/L   Calcium 9.1 8.7 - 10.2 mg/dL   Total Protein 6.5 6.0 - 8.5 g/dL   Albumin 4.4 3.9 - 4.9 g/dL    Comment:               **Please note reference interval change**   Globulin, Total 2.1 1.5 - 4.5 g/dL   Albumin/Globulin Ratio 2.1 1.2 - 2.2   Bilirubin Total 0.3 0.0 - 1.2 mg/dL   Alkaline Phosphatase 72 44 - 121 IU/L   AST 16 0 - 40 IU/L   ALT 7 0 - 32 IU/L  Lipid Panel w/o Chol/HDL Ratio     Status: None   Collection Time: 05/24/22  8:47 AM  Result Value Ref Range   Cholesterol, Total 151 100 - 199 mg/dL   Triglycerides 49 0 - 149 mg/dL   HDL 55 >39 mg/dL   VLDL Cholesterol Cal 11 5 - 40 mg/dL   LDL Chol Calc (NIH) 85 0 - 99 mg/dL  TSH     Status: None   Collection Time: 05/24/22  8:47 AM  Result Value Ref Range   TSH 3.330 0.450 - 4.500 uIU/mL     PHQ2/9:    07/15/2022    2:26 PM 05/24/2022    8:14 AM 02/15/2022    4:28 PM 05/21/2021    8:13 AM 04/01/2021    9:00 AM  Depression screen PHQ 2/9  Decreased Interest 0 0 0 0 0  Down, Depressed, Hopeless 0 0 0 3 0  PHQ - 2 Score 0 0 0 3 0  Altered sleeping 0 1 0 3   Tired, decreased energy 0 0 0 3   Change in appetite 0 0 0 0   Feeling bad or failure about yourself  0 0 0 0   Trouble  concentrating 0 0 0 0   Moving slowly or fidgety/restless 0 0 0 0   Suicidal thoughts 0 0 0 0   PHQ-9 Score 0 1 0 9   Difficult doing work/chores Not difficult at all Not difficult at all  Somewhat difficult       Fall Risk:    07/15/2022    2:26 PM 05/24/2022    8:13 AM 04/01/2021    9:00 AM 05/08/2020    2:33 PM 05/01/2019    1:22 PM  Fall Risk   Falls in the past year? 0 0 0 0 0  Number falls in past yr: 0 0 0 0 0  Injury with Fall? 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 States rash is painful and itching  Recommend a prolonged Prednisone taper as she may continue to be exposed to irritants at work Will also provide topical steroid- Desowen cream to apply on face - recommend avoiding eye area as she is able to prevent skin thinning and damage- recommend only using to 2-3 weeks (no more than 4 weeks for sustained amount of time) Reviewed tenants of skin hydration and eczema management  IF not improving with these measures, may need to refer to Derm for evaluation and management Follow up as needed for persistent or progressing symptoms       Relevant Medications   desonide (DESOWEN) 0.05 % cream   predniSONE (DELTASONE) 10 MG tablet     No follow-ups on file.   I, Waymond Meador E Ola Raap, PA-C, have reviewed all documentation for this visit. The documentation on 07/15/22 for the exam, diagnosis, procedures, and orders are all accurate and complete.   Talitha Givens, MHS, PA-C [facility]

## 2022-07-15 NOTE — Assessment & Plan Note (Signed)
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

## 2022-07-21 ENCOUNTER — Ambulatory Visit
Admission: RE | Admit: 2022-07-21 | Discharge: 2022-07-21 | Disposition: A | Discharge status: Home / Self Care | Payer: BC Managed Care – PPO | Source: Ambulatory Visit | Attending: Nurse Practitioner | Admitting: Nurse Practitioner

## 2022-07-21 DIAGNOSIS — Z1231 Encounter for screening mammogram for malignant neoplasm of breast: Secondary | ICD-10-CM | POA: Insufficient documentation

## 2022-07-22 NOTE — Progress Notes (Signed)
Contacted via MyChart   Normal mammogram, may repeat in one year:)

## 2022-07-26 ENCOUNTER — Encounter: Payer: Self-pay | Admitting: Nurse Practitioner

## 2022-07-26 DIAGNOSIS — K641 Second degree hemorrhoids: Secondary | ICD-10-CM

## 2022-07-29 MED ORDER — LIDOCAINE (ANORECTAL) 5 % EX CREA: 1.0000 | TOPICAL_CREAM | Freq: Two times a day (BID) | CUTANEOUS | 0 refills | Status: DC | PRN

## 2022-07-29 MED ORDER — NIFEDIPINE 0.3 % OINTMENT: 1.0000 | TOPICAL_OINTMENT | Freq: Two times a day (BID) | CUTANEOUS | 0 refills | Status: DC

## 2022-07-29 NOTE — Addendum Note (Signed)
Addended by: [name] on: 07/29/2022 09:28 AM   Modules accepted: Orders

## 2022-08-03 ENCOUNTER — Encounter: Payer: Self-pay | Admitting: Surgery

## 2022-08-03 ENCOUNTER — Other Ambulatory Visit: Payer: Self-pay

## 2022-08-03 ENCOUNTER — Ambulatory Visit: Payer: BC Managed Care – PPO | Admitting: Surgery

## 2022-08-03 VITALS — BP 135/68 | HR 89 | Temp 98.7°F | Ht 69.0 in | Wt 177.6 lb

## 2022-08-03 DIAGNOSIS — K601 Chronic anal fissure: Secondary | ICD-10-CM | POA: Diagnosis not present

## 2022-08-03 NOTE — Progress Notes (Signed)
Patient ID: Elizabeth Case, female   DOB: 11-Sep-1971, 51 y.o.   MRN: [id number]  Chief Complaint: Anal pain off-and-on for 25 years  History of Present Illness Elizabeth Case is a 51 y.o. female with the above, felt to be precipitated during childbirth.  She reports that over the last 6 months has become horrible.  She is to have a degree of constipation where she would skip days without a bowel movement.  She had used MiraLAX for a spell and then stopped feeling it had been controlled.  She denies any history of thrombosis.  But was thought to have ulcerated some lesion with the bright red blood that had been noted previously.  She reports she still has pain with every bowel movement that last the remainder of the morning.  It usually of bright red blood.  She utilizes hand-held showers after bowel movements for cleansing.  She has utilized all kinds of products for her hemorrhoids, including lidocaine cream and most recently the nifedipine, which she is only utilized for the last few days.  She had a prior colonoscopy 6 years ago that was otherwise negative.  She has no personal or family history of colon cancer.  Past Medical History Past Medical History:  Diagnosis Date   Allergic rhinitis    Eczema    GERD (gastroesophageal reflux disease)    Migraines    1x/mo   Vitamin D deficiency disease       Past Surgical History:  Procedure Laterality Date   BREAST BIOPSY     BREAST EXCISIONAL BIOPSY Right    BREAST REDUCTION SURGERY  1994   BREAST SURGERY Right 2012   lumpectomy   CHOLECYSTECTOMY  1998   COLONOSCOPY WITH PROPOFOL N/A 09/08/2016   Procedure: COLONOSCOPY WITH PROPOFOL;  Surgeon: Wyline Mood, MD;  [facility];  Service: Endoscopy;  Laterality: N/A;  Latex sensitivity   REDUCTION MAMMAPLASTY Bilateral 1994    Allergies  Allergen Reactions   Kiwi Extract Shortness Of Breath   Latex Dermatitis   Other Swelling    Walnuts - angioedema    Current  Outpatient Medications  Medication Sig Dispense Refill   Crisaborole (EUCRISA) 2 % OINT Apply a thin film to affected area(s) 2 times daily. 100 g 4   desonide (DESOWEN) 0.05 % cream Apply topically 2 (two) times daily. 30 g 0   dicyclomine (BENTYL) 10 MG capsule Take 1 capsule (10 mg total) by mouth 4 (four) times daily -  before meals and at bedtime. 120 capsule 2   ketoconazole (NIZORAL) 2 % shampoo Apply 1 Application topically 2 (two) times a week. 120 mL 4   levonorgestrel (MIRENA) 20 MCG/DAY IUD 1 each by Intrauterine route once for 1 dose. 1 each 0   Lidocaine, Anorectal, 5 % CREA Apply 1 Application topically 2 (two) times daily as needed (for pain). 15 g 0   Multiple Vitamin (MULTIVITAMIN) tablet Take 1 tablet by mouth daily.     nifedipine 0.3 % ointment Place 1 Application rectally 2 (two) times daily. 30 g 0   triamcinolone (KENALOG) 0.025 % cream Apply 1 application topically 2 (two) times daily. 30 g 5   No current facility-administered medications for this visit.    Family History Family History  Problem Relation Age of Onset   Hypertension Mother    Pulmonary fibrosis Mother    Hypertension Father    Cancer Father 97       prostate    Stroke Maternal  Grandmother    Cancer Paternal Grandmother        breast   Breast cancer Paternal Grandmother    Stroke Paternal Grandfather        cerebral aneurysm caused the stroke   Aneurysm Paternal Grandfather    Diabetes Neg Hx    Heart disease Neg Hx       Social History Social History   Tobacco Use   Smoking status: Never   Smokeless tobacco: Never  Vaping Use   Vaping Use: Never used  Substance Use Topics   Alcohol use: Yes    Alcohol/week: 0.0 standard drinks of alcohol    Comment: holidays   Drug use: No        Review of Systems  Constitutional: Negative.   HENT: Negative.    Eyes: Negative.   Respiratory: Negative.    Cardiovascular: Negative.   Gastrointestinal:  Positive for blood in stool  (Actually after stool/painful bowel movement) and constipation (Reports daily with associated pain, is avoiding more frequent bowel movements.).  Genitourinary: Negative.   Skin:  Positive for rash (Occasional bouts of eczema).  Neurological: Negative.   Psychiatric/Behavioral: Negative.        Physical Exam Blood pressure 135/68, pulse 89, temperature 98.7 F (37.1 C), temperature source Oral, height 5\' 9"  (1.753 m), weight 177 lb 9.6 oz (80.6 kg), SpO2 98 %. Last Weight  Most recent update: 08/03/2022  1:18 PM    Weight  80.6 kg (177 lb 9.6 oz)             CONSTITUTIONAL: Well developed, and nourished, appropriately responsive and aware without distress.   EYES: Sclera non-icteric.   EARS, NOSE, MOUTH AND THROAT:  The oropharynx is clear. Oral mucosa is pink and moist.    Hearing is intact to voice.  NECK: Trachea is midline, and there is no jugular venous distension.  LYMPH NODES:  Lymph nodes in the neck are not enlarged. RESPIRATORY:  Lungs are clear, and breath sounds are equal bilaterally. Normal respiratory effort without pathologic use of accessory muscles. CARDIOVASCULAR: Heart is regular in rate and rhythm. GI: The abdomen is  soft, nontender, and nondistended. There were no palpable masses. I did not appreciate hepatosplenomegaly. There were normal bowel sounds. GU: 10/03/2022 is present as chaperone: There is a small sentinel nodule at 12:00, posteriorly.  On examination there is a fissure immediately involving this area.  She does have a hemorrhoidal ring, but there are no marked piles or redundancy.  No other palpable abnormalities to explain the pain.  Her internal ring is palpable, does not feel remarkably hypertrophic.  And surprisingly not remarkably spastic either. MUSCULOSKELETAL:  Symmetrical muscle tone appreciated in all four extremities.    SKIN: Skin turgor is normal. No pathologic skin lesions appreciated.  NEUROLOGIC:  Motor and sensation appear grossly normal.   Cranial nerves are grossly without defect. PSYCH:  Alert and oriented to person, place and time. Affect is appropriate for situation.  Data Reviewed I have personally reviewed what is currently available of the patient's imaging, recent labs and medical records.   Labs:     Latest Ref Rng & Units 05/24/2022    8:47 AM 02/15/2022    5:01 PM 05/21/2021    8:51 AM  CBC  WBC 3.4 - 10.8 x10E3/uL 6.6  6.6  4.9   Hemoglobin 11.1 - 15.9 g/dL 05/23/2021  70.0  17.4   Hematocrit 34.0 - 46.6 % 45.0  43.0  46.2   Platelets 150 -  450 x10E3/uL 236  250  256       Latest Ref Rng & Units 05/24/2022    8:47 AM 02/15/2022    5:01 PM 05/21/2021    8:51 AM  CMP  Glucose 70 - 99 mg/dL 85  96  84   BUN 6 - 24 mg/dL 8  9  10    Creatinine 0.57 - 1.00 mg/dL 0.59  0.66  0.52   Sodium 134 - 144 mmol/L 137  140  139   Potassium 3.5 - 5.2 mmol/L 4.0  3.4  4.5   Chloride 96 - 106 mmol/L 102  104  103   CO2 20 - 29 mmol/L 24  22  24    Calcium 8.7 - 10.2 mg/dL 9.1  9.4  8.9   Total Protein 6.0 - 8.5 g/dL 6.5  6.6  6.7   Total Bilirubin 0.0 - 1.2 mg/dL 0.3  0.3  0.2   Alkaline Phos 44 - 121 IU/L 72  61  77   AST 0 - 40 IU/L 16  17  16    ALT 0 - 32 IU/L 7  14  15        Imaging:  Within last 24 hrs: No results found.  Assessment    Chronic anal fissure. 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  It is difficult to assess the potential for her to heal with smooth muscle relaxers alone, as she has only utilized them for about 4 days. We discussed the possibility that this may not provide the internal sphincteric spastic  relief we are hoping for.  I am trusting that this will bring her to the healing though I cannot give her any guarantees.  She has an upcoming trip which she hopes to be better for.   Botox risks discussed.  Questions answered, no guarantees ever expressed or implied.  Face-to-face time spent with the patient and accompanying care providers(if present) was 30 minutes, with more than 50% of the time spent counseling, educating, and coordinating care of the patient.    These notes generated with voice recognition software. I apologize for typographical errors.  Ronny Bacon M.D., FACS 08/03/2022, 1:24 PM

## 2022-08-03 NOTE — Patient Instructions (Addendum)
Please use a Fleets enema the night prior to your surgery and 1 hour prior to your surgery.   Our surgery scheduler will call you within 24-48 hours to schedule your surgery. Please have the Blue surgery sheet available when speaking with her.    Advised to pursue a goal of 25 to 30 g of fiber daily.  Made aware that the majority of this may be through natural sources, but advised to be aware of actual consumption and to ensure minimal consumption by daily supplementation.  Various forms of supplements discussed.  Recommended Psyllium husk, that mixes well with applesauce, or the powder which goes down well shaken with chocolate milk.  Strongly advised to consume more fluids to ensure adequate hydration, instructed to watch color of urine to determine adequacy of hydration.  Clarity is pursued in urine output, and bowel activity that correlates to significant meal intake.   We need to avoid deferring having bowel movements, advised to take the time at the first sign of sensation, typically following meals, and in the morning.   Subsequent utilization of MiraLAX may be needed ensure at least daily movement, ideally twice daily bowel movements.  If multiple doses of MiraLAX are necessary utilize them. Never skip a day...  To be regular, we must do the above EVERY day.  Anal Fissure, Adult  An anal fissure is a small tear or crack in the tissue of the anus. Bleeding from a fissure usually stops on its own within a few minutes. However, bleeding will often occur again with each bowel movement until the fissure heals. What are the causes? This condition is usually caused by passing a large or hard stool (feces). Other causes include: Constipation. Frequent diarrhea. Inflammatory bowel disease (Crohn's disease or ulcerative colitis). Childbirth. Infections. Anal sex. What are the signs or symptoms? Symptoms of this condition include: Bleeding from the rectum. Small amounts of blood seen on your  stool, on the toilet paper, or in the toilet after a bowel movement. The blood coats the outside of the stool and is not mixed with the stool. Painful bowel movements. Itching or irritation around the anus. How is this diagnosed? A health care provider may diagnose this condition by closely examining the anal area. An anal fissure can usually be seen with careful inspection. In some cases, a rectal exam may be performed, or a short tube (anoscope) may be used to examine the anal canal. How is this treated? Initial treatment for this condition may include: Taking steps to avoid constipation. This may include making changes to your diet, such as increasing your intake of fiber or fluid. Taking fiber supplements. These supplements can soften your stool to help make bowel movements easier. Your health care provider may also prescribe a stool softener if your stool is hard. Taking sitz baths. This may help to heal the tear. Using medicated creams or ointments. These may be prescribed to lessen discomfort. Treatments that are sometimes used if initial treatments do not work well or if the condition is more severe may include: Botulinum injection. Surgery to repair the fissure. Follow these instructions at home: Eating and drinking  Avoid foods that may cause constipation, such as bananas, milk, and other dairy products. Eat all fruits, except bananas. Drink enough fluid to keep your urine pale yellow. Eat foods that are high in fiber, such as beans, whole grains, and fresh fruits and vegetables. General instructions  Take over-the-counter and prescription medicines only as told by your health care provider.  Use creams or ointments only as told by your health care provider. Keep the anal area clean and dry. Take sitz baths as told by your health care provider. Do not use soap in the sitz baths. Keep all follow-up visits as told by your health care provider. This is important. Contact a health  care provider if you have: More bleeding. A fever. Diarrhea that is mixed with blood. Pain that continues. Ongoing problems that are getting worse rather than better. Summary An anal fissure is a small tear or crack in the tissue of the anus. This condition is usually caused by passing a large or hard stool (feces). Other causes include constipation and frequent diarrhea. Initial treatment for this condition may include taking steps to avoid constipation, such as increasing your intake of fiber or fluid. Follow instructions for care as told by your health care provider. Contact your health care provider if you have more bleeding or your problem is getting worse rather than better. Keep all follow-up visits as told by your health care provider. This is important. This information is not intended to replace advice given to you by your health care provider. Make sure you discuss any questions you have with your health care provider. Document Revised: 05/14/2021 Document Reviewed: 05/14/2021 Elsevier Patient Education  2023 Elsevier Inc.   How to Take a [employer] A sitz bath is a warm water bath that may be used to care for your rectum, genital area, or the area between your rectum and genitals (perineum). In a sitz bath, the water only comes up to your hips and covers your buttocks. A sitz bath may be done in a bathtub or with a portable sitz bath that fits over the toilet. Your health care provider may recommend a sitz bath to help: Relieve pain and discomfort after delivering a baby. Relieve pain and itching from hemorrhoids or anal fissures. Relieve pain after certain surgeries. Relax muscles that are sore or tight. How to take a sitz bath Take 2-4 sitz baths a day, or as many as told by your health care provider. Bathtub sitz bath To take a sitz bath in a bathtub: Partially fill a bathtub with warm water. The water should be deep enough to cover your hips and buttocks when you are  sitting in the bathtub. Follow your health care provider's instructions if you are told to put medicine in the water. Sit in the water. Open the bathtub drain a little, and leave it open during your bath. Turn on the warm water again, enough to replace the water that is draining out. Keep the water running throughout your bath. This helps keep the water at the right level and temperature. Soak in the water for 15-20 minutes, or as long as told by your health care provider. When you are done, be careful when you stand up. You may feel dizzy. After the sitz bath, pat yourself dry. Do not rub your skin to dry it.  Over-the-toilet sitz bath To take a sitz bath with an over-the-toilet basin: Follow the manufacturer's instructions. Fill the basin with warm water. Follow your health care provider's instructions if you were told to put medicine in the water. Sit on the seat. Make sure the water covers your buttocks and perineum. Soak in the water for 15-20 minutes, or as long as told by your health care provider. After the sitz bath, pat yourself dry. Do not rub your skin to dry it. Clean and dry the basin between uses.  Discard the basin if it cracks, or according to the manufacturer's instructions.  Contact a health care provider if: Your pain or itching gets worse. Stop doing sitz baths if your symptoms get worse. You have new symptoms. Stop doing sitz baths until you talk with your health care provider. Summary A sitz bath is a warm water bath in which the water only comes up to your hips and covers your buttocks. Your health care provider may recommend a sitz bath to help relieve pain and discomfort after delivering a baby, relieve pain and itching from hemorrhoids or anal fissures, relieve pain after certain surgeries, or help to relax muscles that are sore or tight. Take 2-4 sitz baths a day, or as many as told by your health care provider. Soak in the water for 15-20 minutes. Stop doing sitz  baths if your symptoms get worse. This information is not intended to replace advice given to you by your health care provider. Make sure you discuss any questions you have with your health care provider. Document Revised: 01/19/2022 Document Reviewed: 01/19/2022 Elsevier Patient Education  2023 Elsevier Inc. Hemorrhoids Hemorrhoids are swollen veins in and around the rectum or anus. There are two types of hemorrhoids: Internal hemorrhoids. These occur in the veins that are just inside the rectum. They may poke through to the outside and become irritated and painful. External hemorrhoids. These occur in the veins that are outside the anus and can be felt as a painful swelling or hard lump near the anus. Most hemorrhoids do not cause serious problems, and they can be managed with home treatments such as diet and lifestyle changes. If home treatments do not help the symptoms, procedures can be done to shrink or remove the hemorrhoids. What are the causes? This condition is caused by increased pressure in the anal area. This pressure may result from various things, including: Constipation. Straining to have a bowel movement. Diarrhea. Pregnancy. Obesity. Sitting for long periods of time. Heavy lifting or other activity that causes you to strain. Anal sex. Riding a bike for a long period of time. What are the signs or symptoms? Symptoms of this condition include: Pain. Anal itching or irritation. Rectal bleeding. Leakage of stool (feces). Anal swelling. One or more lumps around the anus. How is this diagnosed? This condition can often be diagnosed through a visual exam. Other exams or tests may also be done, such as: An exam that involves feeling the rectal area with a gloved hand (digital rectal exam). An exam of the anal canal that is done using a small tube (anoscope). A blood test, if you have lost a significant amount of blood. A test to look inside the colon using a flexible tube  with a camera on the end (sigmoidoscopy or colonoscopy). How is this treated? This condition can usually be treated at home. However, various procedures may be done if dietary changes, lifestyle changes, and other home treatments do not help your symptoms. These procedures can help make the hemorrhoids smaller or remove them completely. Some of these procedures involve surgery, and others do not. Common procedures include: Rubber band ligation. Rubber bands are placed at the base of the hemorrhoids to cut off their blood supply. Sclerotherapy. Medicine is injected into the hemorrhoids to shrink them. Infrared coagulation. A type of light energy is used to get rid of the hemorrhoids. Hemorrhoidectomy surgery. The hemorrhoids are surgically removed, and the veins that supply them are tied off. Stapled hemorrhoidopexy surgery. The surgeon staples the  base of the hemorrhoid to the rectal wall. Follow these instructions at home: Eating and drinking  Eat foods that have a lot of fiber in them, such as whole grains, beans, nuts, fruits, and vegetables. Ask your health care provider about taking products that have added fiber (fiber supplements). Reduce the amount of fat in your diet. You can do this by eating low-fat dairy products, eating less red meat, and avoiding processed foods. Drink enough fluid to keep your urine pale yellow. Managing pain and swelling  Take warm sitz baths for 20 minutes, 3-4 times a day to ease pain and discomfort. You may do this in a bathtub or using a portable sitz bath that fits over the toilet. If directed, apply ice to the affected area. Using ice packs between sitz baths may be helpful. Put ice in a plastic bag. Place a towel between your skin and the bag. Leave the ice on for 20 minutes, 2-3 times a day. General instructions Take over-the-counter and prescription medicines only as told by your health care provider. Use medicated creams or suppositories as  told. Get regular exercise. Ask your health care provider how much and what kind of exercise is best for you. In general, you should do moderate exercise for at least 30 minutes on most days of the week (150 minutes each week). This can include activities such as walking, biking, or yoga. Go to the bathroom when you have the urge to have a bowel movement. Do not wait. Avoid straining to have bowel movements. Keep the anal area dry and clean. Use wet toilet paper or moist towelettes after a bowel movement. Do not sit on the toilet for long periods of time. This increases blood pooling and pain. Keep all follow-up visits as told by your health care provider. This is important. Contact a health care provider if you have: Increasing pain and swelling that are not controlled by treatment or medicine. Difficulty having a bowel movement, or you are unable to have a bowel movement. Pain or inflammation outside the area of the hemorrhoids. Get help right away if you have: Uncontrolled bleeding from your rectum. Summary Hemorrhoids are swollen veins in and around the rectum or anus. Most hemorrhoids can be managed with home treatments such as diet and lifestyle changes. Taking warm sitz baths can help ease pain and discomfort. In severe cases, procedures or surgery can be done to shrink or remove the hemorrhoids. This information is not intended to replace advice given to you by your health care provider. Make sure you discuss any questions you have with your health care provider. Document Revised: 04/29/2021 Document Reviewed: 04/29/2021 Elsevier Patient Education  [place].

## 2022-08-03 NOTE — H&P (View-Only) (Signed)
Patient ID: Elizabeth Case, female   DOB: 01/22/1971, 51 y.o.   MRN: [id number]  Chief Complaint: Anal pain off-and-on for 25 years  History of Present Illness Elizabeth Case is a 51 y.o. female with the above, felt to be precipitated during childbirth.  She reports that over the last 6 months has become horrible.  She is to have a degree of constipation where she would skip days without a bowel movement.  She had used MiraLAX for a spell and then stopped feeling it had been controlled.  She denies any history of thrombosis.  But was thought to have ulcerated some lesion with the bright red blood that had been noted previously.  She reports she still has pain with every bowel movement that last the remainder of the morning.  It usually of bright red blood.  She utilizes hand-held showers after bowel movements for cleansing.  She has utilized all kinds of products for her hemorrhoids, including lidocaine cream and most recently the nifedipine, which she is only utilized for the last few days.  She had a prior colonoscopy 6 years ago that was otherwise negative.  She has no personal or family history of colon cancer.  Past Medical History Past Medical History:  Diagnosis Date   Allergic rhinitis    Eczema    GERD (gastroesophageal reflux disease)    Migraines    1x/mo   Vitamin D deficiency disease       Past Surgical History:  Procedure Laterality Date   BREAST BIOPSY     BREAST EXCISIONAL BIOPSY Right    BREAST REDUCTION SURGERY  1994   BREAST SURGERY Right 2012   lumpectomy   CHOLECYSTECTOMY  1998   COLONOSCOPY WITH PROPOFOL N/A 09/08/2016   Procedure: COLONOSCOPY WITH PROPOFOL;  Surgeon: Kiran Anna, MD;  Location: [facility];  Service: Endoscopy;  Laterality: N/A;  Latex sensitivity   REDUCTION MAMMAPLASTY Bilateral 1994    Allergies  Allergen Reactions   Kiwi Extract Shortness Of Breath   Latex Dermatitis   Other Swelling    Walnuts - angioedema    Current  Outpatient Medications  Medication Sig Dispense Refill   Crisaborole (EUCRISA) 2 % OINT Apply a thin film to affected area(s) 2 times daily. 100 g 4   desonide (DESOWEN) 0.05 % cream Apply topically 2 (two) times daily. 30 g 0   dicyclomine (BENTYL) 10 MG capsule Take 1 capsule (10 mg total) by mouth 4 (four) times daily -  before meals and at bedtime. 120 capsule 2   ketoconazole (NIZORAL) 2 % shampoo Apply 1 Application topically 2 (two) times a week. 120 mL 4   levonorgestrel (MIRENA) 20 MCG/DAY IUD 1 each by Intrauterine route once for 1 dose. 1 each 0   Lidocaine, Anorectal, 5 % CREA Apply 1 Application topically 2 (two) times daily as needed (for pain). 15 g 0   Multiple Vitamin (MULTIVITAMIN) tablet Take 1 tablet by mouth daily.     nifedipine 0.3 % ointment Place 1 Application rectally 2 (two) times daily. 30 g 0   triamcinolone (KENALOG) 0.025 % cream Apply 1 application topically 2 (two) times daily. 30 g 5   No current facility-administered medications for this visit.    Family History Family History  Problem Relation Age of Onset   Hypertension Mother    Pulmonary fibrosis Mother    Hypertension Father    Cancer Father 72       prostate    Stroke Maternal   Grandmother    Cancer Paternal Grandmother        breast   Breast cancer Paternal Grandmother    Stroke Paternal Grandfather        cerebral aneurysm caused the stroke   Aneurysm Paternal Grandfather    Diabetes Neg Hx    Heart disease Neg Hx       Social History Social History   Tobacco Use   Smoking status: Never   Smokeless tobacco: Never  Vaping Use   Vaping Use: Never used  Substance Use Topics   Alcohol use: Yes    Alcohol/week: 0.0 standard drinks of alcohol    Comment: holidays   Drug use: No        Review of Systems  Constitutional: Negative.   HENT: Negative.    Eyes: Negative.   Respiratory: Negative.    Cardiovascular: Negative.   Gastrointestinal:  Positive for blood in stool  (Actually after stool/painful bowel movement) and constipation (Reports daily with associated pain, is avoiding more frequent bowel movements.).  Genitourinary: Negative.   Skin:  Positive for rash (Occasional bouts of eczema).  Neurological: Negative.   Psychiatric/Behavioral: Negative.        Physical Exam Blood pressure 135/68, pulse 89, temperature 98.7 F (37.1 C), temperature source Oral, height 5' 9" (1.753 m), weight 177 lb 9.6 oz (80.6 kg), SpO2 98 %. Last Weight  Most recent update: 08/03/2022  1:18 PM    Weight  80.6 kg (177 lb 9.6 oz)             CONSTITUTIONAL: Well developed, and nourished, appropriately responsive and aware without distress.   EYES: Sclera non-icteric.   EARS, NOSE, MOUTH AND THROAT:  The oropharynx is clear. Oral mucosa is pink and moist.    Hearing is intact to voice.  NECK: Trachea is midline, and there is no jugular venous distension.  LYMPH NODES:  Lymph nodes in the neck are not enlarged. RESPIRATORY:  Lungs are clear, and breath sounds are equal bilaterally. Normal respiratory effort without pathologic use of accessory muscles. CARDIOVASCULAR: Heart is regular in rate and rhythm. GI: The abdomen is  soft, nontender, and nondistended. There were no palpable masses. I did not appreciate hepatosplenomegaly. There were normal bowel sounds. GU: Sheila is present as chaperone: There is a small sentinel nodule at 12:00, posteriorly.  On examination there is a fissure immediately involving this area.  She does have a hemorrhoidal ring, but there are no marked piles or redundancy.  No other palpable abnormalities to explain the pain.  Her internal ring is palpable, does not feel remarkably hypertrophic.  And surprisingly not remarkably spastic either. MUSCULOSKELETAL:  Symmetrical muscle tone appreciated in all four extremities.    SKIN: Skin turgor is normal. No pathologic skin lesions appreciated.  NEUROLOGIC:  Motor and sensation appear grossly normal.   Cranial nerves are grossly without defect. PSYCH:  Alert and oriented to person, place and time. Affect is appropriate for situation.  Data Reviewed I have personally reviewed what is currently available of the patient's imaging, recent labs and medical records.   Labs:     Latest Ref Rng & Units 05/24/2022    8:47 AM 02/15/2022    5:01 PM 05/21/2021    8:51 AM  CBC  WBC 3.4 - 10.8 x10E3/uL 6.6  6.6  4.9   Hemoglobin 11.1 - 15.9 g/dL 14.7  14.6  15.1   Hematocrit 34.0 - 46.6 % 45.0  43.0  46.2   Platelets 150 -   450 x10E3/uL 236  250  256       Latest Ref Rng & Units 05/24/2022    8:47 AM 02/15/2022    5:01 PM 05/21/2021    8:51 AM  CMP  Glucose 70 - 99 mg/dL 85  96  84   BUN 6 - 24 mg/dL 8  9  10    Creatinine 0.57 - 1.00 mg/dL 0.59  0.66  0.52   Sodium 134 - 144 mmol/L 137  140  139   Potassium 3.5 - 5.2 mmol/L 4.0  3.4  4.5   Chloride 96 - 106 mmol/L 102  104  103   CO2 20 - 29 mmol/L 24  22  24    Calcium 8.7 - 10.2 mg/dL 9.1  9.4  8.9   Total Protein 6.0 - 8.5 g/dL 6.5  6.6  6.7   Total Bilirubin 0.0 - 1.2 mg/dL 0.3  0.3  0.2   Alkaline Phos 44 - 121 IU/L 72  61  77   AST 0 - 40 IU/L 16  17  16    ALT 0 - 32 IU/L 7  14  15        Imaging:  Within last 24 hrs: No results found.  Assessment    Chronic anal fissure. 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  It is difficult to assess the potential for her to heal with smooth muscle relaxers alone, as she has only utilized them for about 4 days. We discussed the possibility that this may not provide the internal sphincteric spastic  relief we are hoping for.  I am trusting that this will bring her to the healing though I cannot give her any guarantees.  She has an upcoming trip which she hopes to be better for.   Botox risks discussed.  Questions answered, no guarantees ever expressed or implied.  Face-to-face time spent with the patient and accompanying care providers(if present) was 30 minutes, with more than 50% of the time spent counseling, educating, and coordinating care of the patient.    These notes generated with voice recognition software. I apologize for typographical errors.  Ronny Bacon M.D., FACS 08/03/2022, 1:24 PM

## 2022-08-04 ENCOUNTER — Ambulatory Visit: Payer: Self-pay | Admitting: Surgery

## 2022-08-04 ENCOUNTER — Telehealth: Payer: Self-pay | Admitting: Surgery

## 2022-08-04 DIAGNOSIS — K601 Chronic anal fissure: Secondary | ICD-10-CM

## 2022-08-04 NOTE — Telephone Encounter (Signed)
Patient has been advised of Pre-Admission date/time, and Surgery date at [facility].  Surgery Date: 08/06/22 Preadmission Testing Date: 08/05/22 (phone 8a-1p)  Patient has been made aware to call [phone], between 1-3:00pm the day before surgery, to find out what time to arrive for surgery.    

## 2022-08-05 ENCOUNTER — Encounter
Admission: RE | Admit: 2022-08-05 | Discharge: 2022-08-05 | Disposition: A | Discharge status: Home / Self Care | Payer: BC Managed Care – PPO | Source: Ambulatory Visit | Attending: Surgery | Admitting: Surgery

## 2022-08-05 ENCOUNTER — Other Ambulatory Visit: Payer: Self-pay

## 2022-08-05 DIAGNOSIS — Z01812 Encounter for preprocedural laboratory examination: Secondary | ICD-10-CM

## 2022-08-05 HISTORY — DX: Other specified postprocedural states: Z98.890

## 2022-08-05 NOTE — Patient Instructions (Addendum)
Your procedure is scheduled on: 08/06/22  Report to the Registration Desk on the 1st floor of the [facility]. To find out your arrival time, please call [phone] between 1PM - 3PM on: 08/05/22  If your arrival time is 6:00 am, do not arrive prior to that time as the [facility] entrance doors do not open until 6:00 am.  REMEMBER: Instructions that are not followed completely may result in serious medical risk, up to and including death; or upon the discretion of your surgeon and anesthesiologist your surgery may need to be rescheduled.  Do not eat food after midnight the night before surgery.  No gum chewing, lozengers or hard candies.  TAKE THESE MEDICATIONS THE MORNING OF SURGERY WITH A SIP OF WATER: NONE  Complete Fleets Enema x 2, One the night before your procedure and One 1 hour before you arrive for your procedure.  One week prior to surgery: Stop Anti-inflammatories (NSAIDS) such as Advil, Aleve, Ibuprofen, Motrin, Naproxen, Naprosyn and Aspirin based products such as Excedrin, Goodys Powder, BC Powder.  Stop ANY OVER THE COUNTER supplements until after surgery.  You may however, continue to take Tylenol if needed for pain up until the day of surgery.  No Alcohol for 24 hours before or after surgery.  No Smoking including e-cigarettes for 24 hours prior to surgery.  No chewable tobacco products for at least 6 hours prior to surgery.  No nicotine patches on the day of surgery.  Do not use any "recreational" drugs for at least a week prior to your surgery.  Please be advised that the combination of cocaine and anesthesia may have negative outcomes, up to and including death. If you test positive for cocaine, your surgery will be cancelled.  On the morning of surgery brush your teeth with toothpaste and water, you may rinse your mouth with mouthwash if you wish. Do not swallow any toothpaste or mouthwash.  Do not wear jewelry, make-up, hairpins, clips or nail  polish.  Do not wear lotions, powders, or perfumes.   Do not shave body from the neck down 48 hours prior to surgery just in case you cut yourself which could leave a site for infection.  Also, freshly shaved skin may become irritated if using the CHG soap.  Contact lenses, hearing aids and dentures may not be worn into surgery.  Do not bring valuables to the hospital. [facility] is not responsible for any missing/lost belongings or valuables.   Fleets enema or bowel prep as directed.  Notify your doctor if there is any change in your medical condition (cold, fever, infection).  Wear comfortable clothing (specific to your surgery type) to the hospital.  After surgery, you can help prevent lung complications by doing breathing exercises.  Take deep breaths and cough every 1-2 hours. Your doctor may order a device called an Incentive Spirometer to help you take deep breaths. When coughing or sneezing, hold a pillow firmly against your incision with both hands. This is called "splinting." Doing this helps protect your incision. It also decreases belly discomfort.  If you are being admitted to the hospital overnight, leave your suitcase in the car. After surgery it may be brought to your room.  If you are being discharged the day of surgery, you will not be allowed to drive home. You will need a responsible adult (18 years or older) to drive you home and stay with you that night.   If you are taking public transportation, you will need  to have a responsible adult (18 years or older) with you. Please confirm with your physician that it is acceptable to use public transportation.   Please call the [facility] at [phone] if you have any questions about these instructions.  Surgery Visitation Policy:  Patients undergoing a surgery or procedure may have two family members or support persons with them as long as the person is not COVID-19 positive or experiencing its  symptoms.   Inpatient Visitation:    Visiting hours are 7 a.m. to 8 p.m. Up to four visitors are allowed at one time in a patient room, including children. The visitors may rotate out with other people during the day. One designated support person (adult) may remain overnight.

## 2022-08-06 ENCOUNTER — Encounter: Payer: Self-pay | Admitting: Surgery

## 2022-08-06 ENCOUNTER — Ambulatory Visit: Payer: BC Managed Care – PPO | Admitting: Anesthesiology

## 2022-08-06 ENCOUNTER — Encounter
Admission: RE | Disposition: A | Discharge status: Home / Self Care | Payer: Self-pay | Source: Home / Self Care | Attending: Surgery

## 2022-08-06 ENCOUNTER — Other Ambulatory Visit: Payer: Self-pay

## 2022-08-06 ENCOUNTER — Ambulatory Visit
Admission: RE | Admit: 2022-08-06 | Discharge: 2022-08-06 | Disposition: A | Discharge status: Home / Self Care | Payer: BC Managed Care – PPO | Attending: Surgery | Admitting: Surgery

## 2022-08-06 DIAGNOSIS — K601 Chronic anal fissure: Secondary | ICD-10-CM | POA: Diagnosis present

## 2022-08-06 DIAGNOSIS — Z01812 Encounter for preprocedural laboratory examination: Secondary | ICD-10-CM

## 2022-08-06 DIAGNOSIS — K648 Other hemorrhoids: Secondary | ICD-10-CM | POA: Diagnosis not present

## 2022-08-06 DIAGNOSIS — K644 Residual hemorrhoidal skin tags: Secondary | ICD-10-CM

## 2022-08-06 DIAGNOSIS — K5900 Constipation, unspecified: Secondary | ICD-10-CM | POA: Diagnosis not present

## 2022-08-06 HISTORY — PX: BOTOX INJECTION: SHX5754

## 2022-08-06 HISTORY — PX: SPHINCTEROTOMY: SHX5279

## 2022-08-06 HISTORY — PX: RECTAL EXAM UNDER ANESTHESIA: SHX6399

## 2022-08-06 LAB — POCT PREGNANCY, URINE: Preg Test, Ur: NEGATIVE

## 2022-08-06 SURGERY — EXAM UNDER ANESTHESIA, RECTUM
Anesthesia: General

## 2022-08-06 MED ORDER — GELATIN ABSORBABLE 100 CM EX MISC
CUTANEOUS | Status: AC
  Filled 2022-08-06: qty 1

## 2022-08-06 MED ORDER — KETAMINE HCL 50 MG/5ML IJ SOSY
PREFILLED_SYRINGE | INTRAMUSCULAR | Status: AC
  Filled 2022-08-06: qty 5

## 2022-08-06 MED ORDER — GABAPENTIN 300 MG PO CAPS: 300.0000 mg | ORAL_CAPSULE | ORAL | Status: AC

## 2022-08-06 MED ORDER — PROPOFOL 10 MG/ML IV BOLUS
INTRAVENOUS | Status: AC
  Filled 2022-08-06: qty 20

## 2022-08-06 MED ORDER — PROPOFOL 10 MG/ML IV BOLUS
INTRAVENOUS | Status: DC | PRN
  Administered 2022-08-06: 150 mg via INTRAVENOUS

## 2022-08-06 MED ORDER — GABAPENTIN 300 MG PO CAPS
ORAL_CAPSULE | ORAL | Status: AC
  Administered 2022-08-06: 300 mg via ORAL
  Filled 2022-08-06: qty 1

## 2022-08-06 MED ORDER — CHLORHEXIDINE GLUCONATE CLOTH 2 % EX PADS: 6.0000 | MEDICATED_PAD | Freq: Once | CUTANEOUS | Status: DC

## 2022-08-06 MED ORDER — FAMOTIDINE 20 MG PO TABS
ORAL_TABLET | ORAL | Status: AC
  Administered 2022-08-06: 20 mg via ORAL
  Filled 2022-08-06: qty 1

## 2022-08-06 MED ORDER — ACETAMINOPHEN 500 MG PO TABS
ORAL_TABLET | ORAL | Status: AC
  Administered 2022-08-06: 1000 mg via ORAL
  Filled 2022-08-06: qty 2

## 2022-08-06 MED ORDER — FENTANYL CITRATE (PF) 100 MCG/2ML IJ SOLN
INTRAMUSCULAR | Status: DC | PRN
  Administered 2022-08-06 (×2): 50 ug via INTRAVENOUS

## 2022-08-06 MED ORDER — ONDANSETRON HCL 4 MG/2ML IJ SOLN
INTRAMUSCULAR | Status: AC
  Filled 2022-08-06: qty 2

## 2022-08-06 MED ORDER — ONABOTULINUMTOXINA 100 UNITS IJ SOLR
INTRAMUSCULAR | Status: DC | PRN
  Administered 2022-08-06: 100 [IU] via INTRAMUSCULAR

## 2022-08-06 MED ORDER — LIDOCAINE HCL (PF) 2 % IJ SOLN
INTRAMUSCULAR | Status: AC
  Filled 2022-08-06: qty 5

## 2022-08-06 MED ORDER — DEXAMETHASONE SODIUM PHOSPHATE 10 MG/ML IJ SOLN
INTRAMUSCULAR | Status: AC
  Filled 2022-08-06: qty 1

## 2022-08-06 MED ORDER — CHLORHEXIDINE GLUCONATE 0.12 % MT SOLN: 15.0000 mL | Freq: Once | OROMUCOSAL | Status: AC

## 2022-08-06 MED ORDER — HYDROCODONE-ACETAMINOPHEN 5-325 MG PO TABS: 1.0000 | ORAL_TABLET | Freq: Four times a day (QID) | ORAL | 0 refills | Status: DC | PRN

## 2022-08-06 MED ORDER — BUPIVACAINE-EPINEPHRINE (PF) 0.25% -1:200000 IJ SOLN
INTRAMUSCULAR | Status: AC
  Filled 2022-08-06: qty 30

## 2022-08-06 MED ORDER — EPHEDRINE 5 MG/ML INJ
INTRAVENOUS | Status: AC
  Filled 2022-08-06: qty 5

## 2022-08-06 MED ORDER — DROPERIDOL 2.5 MG/ML IJ SOLN
0.6250 mg | Freq: Once | INTRAMUSCULAR | Status: AC
  Administered 2022-08-06: 0.625 mg via INTRAVENOUS

## 2022-08-06 MED ORDER — DIBUCAINE 1 % EX OINT
TOPICAL_OINTMENT | CUTANEOUS | Status: DC | PRN
  Administered 2022-08-06: 1

## 2022-08-06 MED ORDER — CELECOXIB 200 MG PO CAPS: 200.0000 mg | ORAL_CAPSULE | ORAL | Status: AC

## 2022-08-06 MED ORDER — DROPERIDOL 2.5 MG/ML IJ SOLN
INTRAMUSCULAR | Status: AC
  Filled 2022-08-06: qty 2

## 2022-08-06 MED ORDER — DIBUCAINE (PERIANAL) 1 % EX OINT
TOPICAL_OINTMENT | CUTANEOUS | Status: AC
  Filled 2022-08-06: qty 28

## 2022-08-06 MED ORDER — FENTANYL CITRATE (PF) 100 MCG/2ML IJ SOLN: 25.0000 ug | INTRAMUSCULAR | Status: DC | PRN

## 2022-08-06 MED ORDER — BUPIVACAINE LIPOSOME 1.3 % IJ SUSP
INTRAMUSCULAR | Status: DC | PRN
  Administered 2022-08-06: 10 mL

## 2022-08-06 MED ORDER — FLEET ENEMA 7-19 GM/118ML RE ENEM: 1.0000 | ENEMA | Freq: Once | RECTAL | Status: DC

## 2022-08-06 MED ORDER — PHENYLEPHRINE 80 MCG/ML (10ML) SYRINGE FOR IV PUSH (FOR BLOOD PRESSURE SUPPORT)
PREFILLED_SYRINGE | INTRAVENOUS | Status: AC
  Filled 2022-08-06: qty 10

## 2022-08-06 MED ORDER — FAMOTIDINE 20 MG PO TABS: 20.0000 mg | ORAL_TABLET | Freq: Once | ORAL | Status: AC

## 2022-08-06 MED ORDER — CELECOXIB 200 MG PO CAPS
ORAL_CAPSULE | ORAL | Status: AC
  Administered 2022-08-06: 200 mg via ORAL
  Filled 2022-08-06: qty 1

## 2022-08-06 MED ORDER — DEXAMETHASONE SODIUM PHOSPHATE 10 MG/ML IJ SOLN
INTRAMUSCULAR | Status: DC | PRN
  Administered 2022-08-06: 10 mg via INTRAVENOUS

## 2022-08-06 MED ORDER — 0.9 % SODIUM CHLORIDE (POUR BTL) OPTIME
TOPICAL | Status: DC | PRN
  Administered 2022-08-06: 500 mL

## 2022-08-06 MED ORDER — FENTANYL CITRATE (PF) 100 MCG/2ML IJ SOLN
INTRAMUSCULAR | Status: AC
  Filled 2022-08-06: qty 2

## 2022-08-06 MED ORDER — LACTATED RINGERS IV SOLN: INTRAVENOUS | Status: DC

## 2022-08-06 MED ORDER — CHLORHEXIDINE GLUCONATE 0.12 % MT SOLN
OROMUCOSAL | Status: AC
  Administered 2022-08-06: 15 mL via OROMUCOSAL
  Filled 2022-08-06: qty 15

## 2022-08-06 MED ORDER — ACETAMINOPHEN 500 MG PO TABS: 1000.0000 mg | ORAL_TABLET | ORAL | Status: AC

## 2022-08-06 MED ORDER — MIDAZOLAM HCL 2 MG/2ML IJ SOLN
INTRAMUSCULAR | Status: AC
  Filled 2022-08-06: qty 2

## 2022-08-06 MED ORDER — ONDANSETRON HCL 4 MG/2ML IJ SOLN
4.0000 mg | Freq: Once | INTRAMUSCULAR | Status: AC | PRN
  Administered 2022-08-06: 4 mg via INTRAVENOUS

## 2022-08-06 MED ORDER — KETAMINE HCL 10 MG/ML IJ SOLN
INTRAMUSCULAR | Status: DC | PRN
  Administered 2022-08-06: 20 mg via INTRAVENOUS

## 2022-08-06 MED ORDER — MIDAZOLAM HCL 2 MG/2ML IJ SOLN
INTRAMUSCULAR | Status: DC | PRN
  Administered 2022-08-06: 2 mg via INTRAVENOUS

## 2022-08-06 MED ORDER — ONDANSETRON HCL 4 MG/2ML IJ SOLN
INTRAMUSCULAR | Status: DC | PRN
  Administered 2022-08-06: 4 mg via INTRAVENOUS

## 2022-08-06 MED ORDER — EPHEDRINE SULFATE (PRESSORS) 50 MG/ML IJ SOLN
INTRAMUSCULAR | Status: DC | PRN
  Administered 2022-08-06: 5 mg via INTRAVENOUS

## 2022-08-06 MED ORDER — GELATIN ABSORBABLE 100 CM EX MISC
CUTANEOUS | Status: DC | PRN
  Administered 2022-08-06: 1

## 2022-08-06 MED ORDER — BUPIVACAINE LIPOSOME 1.3 % IJ SUSP: 20.0000 mL | Freq: Once | INTRAMUSCULAR | Status: DC

## 2022-08-06 MED ORDER — ORAL CARE MOUTH RINSE: 15.0000 mL | Freq: Once | OROMUCOSAL | Status: AC

## 2022-08-06 MED ORDER — ROCURONIUM BROMIDE 10 MG/ML (PF) SYRINGE
PREFILLED_SYRINGE | INTRAVENOUS | Status: AC
  Filled 2022-08-06: qty 10

## 2022-08-06 MED ORDER — BUPIVACAINE LIPOSOME 1.3 % IJ SUSP
INTRAMUSCULAR | Status: AC
  Filled 2022-08-06: qty 10

## 2022-08-06 MED ORDER — PHENYLEPHRINE 80 MCG/ML (10ML) SYRINGE FOR IV PUSH (FOR BLOOD PRESSURE SUPPORT)
PREFILLED_SYRINGE | INTRAVENOUS | Status: DC | PRN
  Administered 2022-08-06: 80 ug via INTRAVENOUS

## 2022-08-06 MED ORDER — LIDOCAINE HCL (CARDIAC) PF 100 MG/5ML IV SOSY
PREFILLED_SYRINGE | INTRAVENOUS | Status: DC | PRN
  Administered 2022-08-06: 100 mg via INTRAVENOUS

## 2022-08-06 SURGICAL SUPPLY — 26 items
BLADE SURG 15 STRL LF DISP TIS (BLADE) ×1 IMPLANT
BLADE SURG 15 STRL SS (BLADE) ×1
BRIEF MESH DISP 2XL (UNDERPADS AND DIAPERS) ×1 IMPLANT
DRAPE LAPAROTOMY 100X77 ABD (DRAPES) ×1 IMPLANT
DRAPE LEGGINS SURG 28X43 STRL (DRAPES) ×1 IMPLANT
DRSG GAUZE FLUFF 36X18 (GAUZE/BANDAGES/DRESSINGS) ×1 IMPLANT
ELECT CAUTERY BLADE TIP 2.5 (TIP) ×1
ELECT REM PT RETURN 9FT ADLT (ELECTROSURGICAL) ×1
ELECTRODE CAUTERY BLDE TIP 2.5 (TIP) ×1 IMPLANT
ELECTRODE REM PT RTRN 9FT ADLT (ELECTROSURGICAL) ×1 IMPLANT
GAUZE 4X4 16PLY ~~LOC~~+RFID DBL (SPONGE) ×1 IMPLANT
GLOVE ORTHO TXT STRL SZ7.5 (GLOVE) ×1 IMPLANT
GOWN STRL REUS W/ TWL LRG LVL3 (GOWN DISPOSABLE) ×1 IMPLANT
GOWN STRL REUS W/ TWL XL LVL3 (GOWN DISPOSABLE) ×1 IMPLANT
GOWN STRL REUS W/TWL LRG LVL3 (GOWN DISPOSABLE) ×1
GOWN STRL REUS W/TWL XL LVL3 (GOWN DISPOSABLE) ×1
KIT TURNOVER KIT A (KITS) ×1 IMPLANT
MANIFOLD NEPTUNE II (INSTRUMENTS) ×1 IMPLANT
NDL SAFETY ECLIP 18X1.5 (MISCELLANEOUS) ×1 IMPLANT
NEEDLE HYPO 22GX1.5 SAFETY (NEEDLE) ×1 IMPLANT
PACK BASIN MINOR ARMC (MISCELLANEOUS) ×1 IMPLANT
SOL PREP PVP 2OZ (MISCELLANEOUS) ×1
SOLUTION PREP PVP 2OZ (MISCELLANEOUS) ×1 IMPLANT
SYR 10ML LL (SYRINGE) ×1 IMPLANT
TRAP FLUID SMOKE EVACUATOR (MISCELLANEOUS) ×1 IMPLANT
WATER STERILE IRR 500ML POUR (IV SOLUTION) ×1 IMPLANT

## 2022-08-06 NOTE — Transfer of Care (Signed)
Immediate Anesthesia Transfer of Care Note  Patient: Elizabeth Case  Procedure(s) Performed: RECTAL EXAM UNDER ANESTHESIA SPHINCTEROTOMY, chemical BOTOX INJECTION  Patient Location: PACU  Anesthesia Type:General  Level of Consciousness: drowsy  Airway & Oxygen Therapy: Patient Spontanous Breathing and Patient connected to face mask oxygen  Post-op Assessment: Report given to RN and Post -op Vital signs reviewed and stable  Post vital signs: Reviewed and stable  Last Vitals:  Vitals Value Taken Time  BP 139/75 08/06/22 0815  Temp 35.9 C 08/06/22 0815  Pulse 98 08/06/22 0818  Resp 19 08/06/22 0818  SpO2 100 % 08/06/22 0818  Vitals shown include unvalidated device data.  Last Pain:  Vitals:   08/06/22 4665  TempSrc: Oral  PainSc: 2          Complications: No notable events documented.

## 2022-08-06 NOTE — Anesthesia Procedure Notes (Signed)
Procedure Name: LMA Insertion Date/Time: 08/06/2022 7:34 AM  Performed by: Esaw Grandchild, CRNAPre-anesthesia Checklist: Patient identified, Emergency Drugs available, Suction available and Patient being monitored Patient Re-evaluated:Patient Re-evaluated prior to induction Oxygen Delivery Method: Circle system utilized Preoxygenation: Pre-oxygenation with 100% oxygen Induction Type: IV induction Ventilation: Mask ventilation without difficulty LMA: LMA inserted LMA Size: 4.0 Number of attempts: 1 Placement Confirmation: positive ETCO2 and breath sounds checked- equal and bilateral Tube secured with: Tape Dental Injury: Teeth and Oropharynx as per pre-operative assessment  Comments: Igel 4 utilized w/o difficulty

## 2022-08-06 NOTE — Discharge Instructions (Signed)

## 2022-08-06 NOTE — Interval H&P Note (Signed)
History and Physical Interval Note:  08/06/2022 7:20 AM  Elizabeth Case  has presented today for surgery, with the diagnosis of anal fissure.  The various methods of treatment have been discussed with the patient and family. After consideration of risks, benefits and other options for treatment, the patient has consented to  Procedure(s): RECTAL EXAM UNDER ANESTHESIA (N/A) SPHINCTEROTOMY, chemical (N/A) BOTOX INJECTION (N/A) as a surgical intervention.  The patient's history has been reviewed, patient examined, no change in status, stable for surgery.  I have reviewed the patient's chart and labs.  Questions were answered to the patient's satisfaction.     Ronny Bacon

## 2022-08-06 NOTE — Anesthesia Preprocedure Evaluation (Signed)
Anesthesia Evaluation  Patient identified by MRN, date of birth, ID band Patient awake    Reviewed: Allergy & Precautions, NPO status , Patient's Chart, lab work & pertinent test results  History of Anesthesia Complications (+) PONV and history of anesthetic complications  Airway Mallampati: II  TM Distance: >3 FB Neck ROM: full    Dental  (+) Teeth Intact   Pulmonary neg pulmonary ROS,    Pulmonary exam normal breath sounds clear to auscultation       Cardiovascular Exercise Tolerance: Good negative cardio ROS Normal cardiovascular exam+ dysrhythmias  Rhythm:Regular Rate:Normal     Neuro/Psych  Headaches, negative neurological ROS  negative psych ROS   GI/Hepatic negative GI ROS, Neg liver ROS, GERD  Medicated,  Endo/Other  negative endocrine ROS  Renal/GU negative Renal ROS     Musculoskeletal negative musculoskeletal ROS (+)   Abdominal Normal abdominal exam  (+)   Peds negative pediatric ROS (+)  Hematology negative hematology ROS (+)   Anesthesia Other Findings Past Medical History: No date: Allergic rhinitis No date: Eczema No date: GERD (gastroesophageal reflux disease) No date: Migraines     Comment:  1x/mo No date: PONV (postoperative nausea and vomiting) No date: Vitamin D deficiency disease  Past Surgical History: No date: BREAST BIOPSY No date: BREAST EXCISIONAL BIOPSY; Right 1994: BREAST REDUCTION SURGERY 2012: BREAST SURGERY; Right     Comment:  lumpectomy 1998: CHOLECYSTECTOMY 09/08/2016: COLONOSCOPY WITH PROPOFOL; N/A     Comment:  Procedure: COLONOSCOPY WITH PROPOFOL;  Surgeon: Jonathon Bellows, MD;  Location: [facility];  Service:               Endoscopy;  Laterality: N/A;  Latex sensitivity 1994: REDUCTION MAMMAPLASTY; Bilateral No date: WISDOM TOOTH EXTRACTION     Reproductive/Obstetrics negative OB ROS                              Anesthesia Physical Anesthesia Plan  ASA: 2  Anesthesia Plan: General   Post-op Pain Management:    Induction: Intravenous  PONV Risk Score and Plan: 1 and Ondansetron and Dexamethasone  Airway Management Planned: LMA  Additional Equipment:   Intra-op Plan:   Post-operative Plan: Extubation in OR  Informed Consent: I have reviewed the patients History and Physical, chart, labs and discussed the procedure including the risks, benefits and alternatives for the proposed anesthesia with the patient or authorized representative who has indicated his/her understanding and acceptance.     Dental Advisory Given  Plan Discussed with: CRNA and Surgeon  Anesthesia Plan Comments:         Anesthesia Quick Evaluation

## 2022-08-06 NOTE — Op Note (Signed)
Rectal examination under anesthesia, excision of internal hemorrhoidal polyp, Botox chemical sphincterotomy  Pre-operative Diagnosis: Chronic anal fissure  Post-operative Diagnosis: same, with left-sided internal hemorrhoidal polyp.  Surgeon: Ronny Bacon, M.D., FACS  Anesthesia: General LMA  Findings: Centimeter sized left-sided internal hemorrhoidal polyp.  Purulent discharge from sinus tract of sentinel tag.  Estimated Blood Loss: 15 mL         Specimens: Internal hemorrhoidal polyp.          Complications: none              Procedure Details  The patient was seen again in the Holding Room. The benefits, complications, treatment options, and expected outcomes were discussed with the patient. The risks of bleeding, infection, recurrence of symptoms, failure to resolve symptoms, unanticipated injury, prosthetic placement, prosthetic infection, any of which could require further surgery were reviewed with the patient. The likelihood of improving the patient's symptoms with return to their baseline status is anticipated.  The patient and/or family concurred with the proposed plan, giving informed consent.  The patient was taken to Operating Room, identified and the procedure verified.    Prior to the induction of general anesthesia, antibiotic prophylaxis was administered. VTE prophylaxis was in place.  General anesthesia was then administered and tolerated well. After the induction, the patient was positioned in the lithotomy position and the perianal region was prepped with Betadine and draped in the sterile fashion.  A Time Out was held and the above information confirmed.  Digital rectal exam was again completed identifying a previously unappreciated internal hemorrhoidal polyp on the patient's left.  There is also some purulent discharge noted from the sentinel tag.  This seemed to communicate to a small sinus tract that was limited but within the intersphincteric groove posteriorly.   Did not appear to track elsewhere. The internal hemorrhoidal tag was grasped and excised with electrocautery maintaining excellent hemostasis.  It was sent as specimen. I proceeded with laying open the sinus for improved drainage, dividing a few of the distalmost fibers of the internal sphincter posteriorly.  The sinus did not progress further than this point. I then injected a total of 100 units of Botox, dividing the 2.5 mL volumes to administer to each side of the internal sphincter. I then utilized 10 mL of Exparel to infiltrate the posterior third of the anal sphincteric mechanism to provide postoperative pain relief to her anal fissure. Confirming adequate hemostasis we then proceeded with placement of a Gelfoam rolled with Nupercainal ointment. Fluffs and mesh panties were utilized to dress the region. She was subsequently awakened taken out of the lithotomy position and transported to recovery in stable condition.  She tolerated the procedure well.       Ronny Bacon M.D., [facility] 08/06/2022 8:22 AM

## 2022-08-09 LAB — SURGICAL PATHOLOGY

## 2022-08-09 NOTE — Anesthesia Postprocedure Evaluation (Signed)
Anesthesia Post Note  Patient: Elizabeth Case  Procedure(s) Performed: RECTAL EXAM UNDER ANESTHESIA SPHINCTEROTOMY, chemical BOTOX INJECTION  Anesthesia Type: General Anesthetic complications: no   No notable events documented.   Last Vitals:  Vitals:   08/06/22 1006 08/06/22 1119  BP: (!) 124/53 116/71  Pulse: 81 81  Resp: 16 16  Temp:    SpO2: 100% 100%    Last Pain:  Vitals:   08/06/22 0906  TempSrc: Temporal  PainSc: 4                  VAN STAVEREN,Orvetta Danielski

## 2022-08-16 ENCOUNTER — Other Ambulatory Visit: Payer: Self-pay | Admitting: Nurse Practitioner

## 2022-08-17 NOTE — Telephone Encounter (Signed)
Requested medication (s) are due for refill today: yes  Requested medication (s) are on the active medication list: yes  Last refill:  08/18/20  Future visit scheduled: yes  Notes to clinic:  Unable to refill per protocol, cannot delegate.      Requested Prescriptions  Pending Prescriptions Disp Refills   triamcinolone (KENALOG) 0.025 % cream [Pharmacy Med Name: TRIAMCINOLONE 0.025% CREAM] 30 g 0    Sig: Apply 1 application topically 2 (two) times daily.     Not Delegated - Dermatology:  Corticosteroids Failed - 08/16/2022  4:36 PM      Failed - This refill cannot be delegated      Passed - Valid encounter within last 12 months    Recent Outpatient Visits           1 month ago Eczema, unspecified type   [place], PA-C   2 months ago Frequent unifocal PVCs   [place], Troy T, NP   6 months ago Diarrhea, unspecified type   [employer], Megan P, DO   8 months ago Urinary symptom or sign   Lucien, Jolene T, NP   11 months ago Urinary tract infection with hematuria, site unspecified   [place] Vigg, Avanti, MD       Future Appointments             In 9 months Cannady, Barbaraann Faster, NP [employer]

## 2022-08-18 ENCOUNTER — Ambulatory Visit: Payer: BC Managed Care – PPO | Attending: Cardiology

## 2022-08-18 DIAGNOSIS — I493 Ventricular premature depolarization: Secondary | ICD-10-CM | POA: Diagnosis not present

## 2022-08-18 LAB — ECHOCARDIOGRAM COMPLETE
AR max vel: 2.9 cm2
AV Area VTI: 3.15 cm2
AV Area mean vel: 2.83 cm2
AV Mean grad: 4 mmHg
AV Peak grad: 7.7 mmHg
Ao pk vel: 1.39 m/s
Area-P 1/2: 4.39 cm2
Calc EF: 61.1 %
S' Lateral: 3.3 cm
Single Plane A2C EF: 57.9 %
Single Plane A4C EF: 63.5 %

## 2022-08-19 ENCOUNTER — Ambulatory Visit (INDEPENDENT_AMBULATORY_CARE_PROVIDER_SITE_OTHER): Payer: BC Managed Care – PPO | Admitting: Surgery

## 2022-08-19 ENCOUNTER — Encounter: Payer: Self-pay | Admitting: Surgery

## 2022-08-19 VITALS — BP 112/68 | HR 87 | Temp 98.1°F | Ht 68.0 in | Wt 178.0 lb

## 2022-08-19 DIAGNOSIS — K601 Chronic anal fissure: Secondary | ICD-10-CM | POA: Diagnosis not present

## 2022-08-19 DIAGNOSIS — Z9889 Other specified postprocedural states: Secondary | ICD-10-CM

## 2022-08-19 DIAGNOSIS — Z09 Encounter for follow-up examination after completed treatment for conditions other than malignant neoplasm: Secondary | ICD-10-CM | POA: Diagnosis not present

## 2022-08-19 NOTE — Progress Notes (Signed)
[facility] POST-OP OFFICE VISIT  08/19/2022  HPI: Elizabeth Case is a 51 y.o. female 13 days s/p excision of internal hemorrhoidal polyp, and Botox sphincterotomy.  She is very happy.  Comfortable, denies any significant bleeding.  Moving her bowels well.  Vital signs: BP 112/68   Pulse 87   Temp 98.1 F (36.7 C)   Ht 5\' 8"  (1.727 m)   Wt 178 lb (80.7 kg)   LMP 08/05/2022 (Exact Date)   SpO2 98%   BMI 27.06 kg/m    Physical Exam: Constitutional: She looks good, quite happy with current results. Abdomen: Nontender. Very, deferred.  Assessment/Plan: This is a 51 y.o. female 13 days s/p removal of internal hemorrhoidal polyp, and Botox sphincterotomy.  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  I will give her a chance to continue her healing for another month and reevaluate with exam at that time.   Ronny Bacon M.D., FACS 08/19/2022, 2:17 PM

## 2022-08-19 NOTE — Patient Instructions (Addendum)
Keep your bowel movements easy and soft.   Do sitz baths as needed.   Follow-up with our office in one month.   Please call and ask to speak with a nurse if you develop questions or concerns.

## 2022-08-20 DIAGNOSIS — Z9889 Other specified postprocedural states: Secondary | ICD-10-CM | POA: Insufficient documentation

## 2022-09-16 ENCOUNTER — Encounter: Payer: Self-pay | Admitting: Surgery

## 2022-09-16 ENCOUNTER — Other Ambulatory Visit: Payer: Self-pay

## 2022-09-16 ENCOUNTER — Ambulatory Visit (INDEPENDENT_AMBULATORY_CARE_PROVIDER_SITE_OTHER): Payer: BC Managed Care – PPO | Admitting: Surgery

## 2022-09-16 VITALS — BP 118/78 | HR 87 | Temp 98.5°F | Ht 68.0 in | Wt 186.0 lb

## 2022-09-16 DIAGNOSIS — K644 Residual hemorrhoidal skin tags: Secondary | ICD-10-CM | POA: Diagnosis not present

## 2022-09-16 DIAGNOSIS — Z09 Encounter for follow-up examination after completed treatment for conditions other than malignant neoplasm: Secondary | ICD-10-CM | POA: Diagnosis not present

## 2022-09-16 NOTE — Patient Instructions (Signed)
Please call with any questions or concerns.

## 2022-09-16 NOTE — Progress Notes (Signed)
[facility] POST-OP OFFICE VISIT  09/16/2022  HPI: Elizabeth Case is a 51 y.o. female 41 days s/p excision of internal hemorrhoidal polyp, and Botox sphincterotomy.   She found a source of fiber and is supplementing regularly.  She feels the residual hemorrhoidal tissue is improving as well. She is very happy.  Comfortable, denies any significant bleeding.  Moving her bowels well.  Vital signs: LMP 08/05/2022 (Exact Date)    Physical Exam: Constitutional: She looks good, quite happy with current results. Abdomen: Nontender. Exam, deferred.  Assessment/Plan: This is a 51 y.o. female 41 days s/p removal of internal hemorrhoidal polyp, and Botox sphincterotomy.  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  I reassured her that we are always here if she should have some further issue to [place], and that we remain readily available to assist in any way down the road.  Campbell Lerner M.D., FACS 09/16/2022, 1:26 PM

## 2022-11-23 ENCOUNTER — Ambulatory Visit: Payer: BC Managed Care – PPO | Admitting: Nurse Practitioner

## 2022-11-23 ENCOUNTER — Encounter: Payer: Self-pay | Admitting: Nurse Practitioner

## 2022-11-23 VITALS — BP 126/60 | HR 63 | Temp 98.2°F | Wt 190.0 lb

## 2022-11-23 DIAGNOSIS — J0110 Acute frontal sinusitis, unspecified: Secondary | ICD-10-CM | POA: Diagnosis not present

## 2022-11-23 MED ORDER — AMOXICILLIN 500 MG PO CAPS: 500.0000 mg | ORAL_CAPSULE | Freq: Two times a day (BID) | ORAL | 0 refills | Status: AC

## 2022-11-23 MED ORDER — FLUCONAZOLE 150 MG PO TABS: 150.0000 mg | ORAL_TABLET | Freq: Once | ORAL | 0 refills | Status: AC

## 2022-11-23 NOTE — Progress Notes (Signed)
BP 126/60   Pulse 63   Temp 98.2 F (36.8 C) (Oral)   Wt 190 lb (86.2 kg)   SpO2 97%   BMI 28.89 kg/m    Subjective:    Patient ID: Elizabeth Case, female    DOB: 03/30/1971, 52 y.o.   MRN: [id number]  HPI: Elizabeth Case is a 52 y.o. female  Chief Complaint  Patient presents with   URI    Pt states she has been having congestion, cough, and runny nose for the past 2 to 3 weeks    UPPER RESPIRATORY TRACT INFECTION Worst symptom: symptoms have been ongoing x a couple of weeks.  Fever: no Cough: no Shortness of breath: no Wheezing: no Chest pain: no Chest tightness: no Chest congestion: no Nasal congestion: yes Runny nose: yes Post nasal drip: yes Sneezing: yes Sore throat: no Swollen glands: no Sinus pressure: yes Headache: yes Face pain: yes Toothache: yes Ear pain: yes bilateral Ear pressure: yes bilateral Eyes red/itching:no Eye drainage/crusting: no  Vomiting: no Rash: no Fatigue: yes Sick contacts: no Strep contacts: no  Context: stable Recurrent sinusitis: no Relief with OTC cold/cough medications: no  Treatments attempted: mucinex   Relevant past medical, surgical, family and social history reviewed and updated as indicated. Interim medical history since our last visit reviewed. Allergies and medications reviewed and updated.  Review of Systems  Constitutional:  Positive for fatigue. Negative for fever.  HENT:  Positive for congestion, dental problem, ear pain, postnasal drip, rhinorrhea, sinus pressure, sinus pain and sneezing. Negative for sore throat.   Respiratory:  Negative for cough, shortness of breath and wheezing.   Cardiovascular:  Negative for chest pain.  Gastrointestinal:  Negative for vomiting.  Skin:  Negative for rash.  Neurological:  Positive for headaches.    Per HPI unless specifically indicated above     Objective:    BP 126/60   Pulse 63   Temp 98.2 F (36.8 C) (Oral)   Wt 190 lb (86.2 kg)   SpO2 97%    BMI 28.89 kg/m   Wt Readings from Last 3 Encounters:  11/23/22 190 lb (86.2 kg)  09/16/22 186 lb (84.4 kg)  08/19/22 178 lb (80.7 kg)    Physical Exam Vitals and nursing note reviewed.  Constitutional:      General: She is not in acute distress.    Appearance: Normal appearance. She is normal weight. She is not ill-appearing, toxic-appearing or diaphoretic.  HENT:     Head: Normocephalic.     Right Ear: Tympanic membrane and external ear normal.     Left Ear: Tympanic membrane and external ear normal.     Nose: Congestion and rhinorrhea present.     Right Sinus: Frontal sinus tenderness present. No maxillary sinus tenderness.     Left Sinus: Frontal sinus tenderness present. No maxillary sinus tenderness.     Mouth/Throat:     Mouth: Mucous membranes are moist.     Pharynx: Oropharynx is clear.  Eyes:     General:        Right eye: No discharge.        Left eye: No discharge.     Extraocular Movements: Extraocular movements intact.     Conjunctiva/sclera: Conjunctivae normal.     Pupils: Pupils are equal, round, and reactive to light.  Cardiovascular:     Rate and Rhythm: Normal rate and regular rhythm.     Heart sounds: No murmur heard. Pulmonary:     Effort:  Pulmonary effort is normal. No respiratory distress.     Breath sounds: Normal breath sounds. No wheezing or rales.  Musculoskeletal:     Cervical back: Normal range of motion and neck supple.  Skin:    General: Skin is warm and dry.     Capillary Refill: Capillary refill takes less than 2 seconds.  Neurological:     General: No focal deficit present.     Mental Status: She is alert and oriented to person, place, and time. Mental status is at baseline.  Psychiatric:        Mood and Affect: Mood normal.        Behavior: Behavior normal.        Thought Content: Thought content normal.        Judgment: Judgment normal.     Results for orders placed or performed in visit on 08/18/22  ECHOCARDIOGRAM COMPLETE   Result Value Ref Range   AR max vel 2.90 cm2   AV Peak grad 7.7 mmHg   Ao pk vel 1.39 m/s   S' Lateral 3.30 cm   Area-P 1/2 4.39 cm2   AV Area VTI 3.15 cm2   AV Mean grad 4.0 mmHg   Single Plane A4C EF 63.5 %   Single Plane A2C EF 57.9 %   Calc EF 61.1 %   AV Area mean vel 2.83 cm2      Assessment & Plan:   Problem List Items Addressed This Visit   None Visit Diagnoses     Acute non-recurrent frontal sinusitis    -  Primary   Complete course of amoxicillin.  Continue with OTC symptom management. Follow up if symptoms not improved.   Relevant Medications   amoxicillin (AMOXIL) 500 MG capsule   fluconazole (DIFLUCAN) 150 MG tablet        Follow up plan: Return if symptoms worsen or fail to improve.

## 2022-12-07 ENCOUNTER — Encounter: Payer: Self-pay | Admitting: Nurse Practitioner

## 2022-12-11 ENCOUNTER — Ambulatory Visit
Admission: EM | Admit: 2022-12-11 | Discharge: 2022-12-11 | Disposition: A | Discharge status: Home / Self Care | Payer: BC Managed Care – PPO | Attending: Emergency Medicine | Admitting: Emergency Medicine

## 2022-12-11 ENCOUNTER — Encounter: Payer: Self-pay | Admitting: Emergency Medicine

## 2022-12-11 ENCOUNTER — Encounter: Payer: Self-pay | Admitting: Nurse Practitioner

## 2022-12-11 DIAGNOSIS — U071 COVID-19: Secondary | ICD-10-CM | POA: Diagnosis not present

## 2022-12-11 LAB — SARS CORONAVIRUS 2 BY RT PCR: SARS Coronavirus 2 by RT PCR: POSITIVE — AB

## 2022-12-11 MED ORDER — IPRATROPIUM BROMIDE 0.06 % NA SOLN: 2.0000 | Freq: Four times a day (QID) | NASAL | 12 refills | Status: DC

## 2022-12-11 MED ORDER — PROMETHAZINE-DM 6.25-15 MG/5ML PO SYRP: 5.0000 mL | ORAL_SOLUTION | Freq: Four times a day (QID) | ORAL | 0 refills | Status: DC | PRN

## 2022-12-11 MED ORDER — BENZONATATE 100 MG PO CAPS: 200.0000 mg | ORAL_CAPSULE | Freq: Three times a day (TID) | ORAL | 0 refills | Status: DC

## 2022-12-11 NOTE — ED Triage Notes (Signed)
Patient presents with sinus pressure, congestion and sore throat x day 3.   Patient reports history of sinus infection and declines testing, states she took an at home covid test and negative results.

## 2022-12-11 NOTE — Discharge Instructions (Signed)
You have tested positive for COVID-19 today.  You will need to quarantine for 5 days from onset of your symptoms.  After the 5 days you can break quarantine if your symptoms have improved and you have not had a fever for 24 hours without taking Tylenol and/or ibuprofen.  You must wear a mask around others for an additional 5 days.  Take over-the-counter Tylenol and/or ibuprofen according to the package instructions as needed for fever or pain.  Use the Atrovent nasal spray, 2 squirts in each nostril every 6 hours, as needed for runny nose and postnasal drip.  Use the Tessalon Perles every 8 hours during the day.  Take them with a small sip of water.  They may give you some numbness to the base of your tongue or a metallic taste in your mouth, this is normal.  Use the Promethazine DM cough syrup at bedtime for cough and congestion.  It will make you drowsy so do not take it during the day.  If you develop any shortness of breath, especially at rest, feel as though you cannot catch your breath, you are unable to speak in full sentences, or your lips begin turning blue you need to call 911 or go to the ER.

## 2022-12-11 NOTE — ED Provider Notes (Signed)
[facility]    CSN: [id number] Arrival date & time: 12/11/22  0800      History   Chief Complaint Chief Complaint  Patient presents with   Nasal Congestion    HPI Elizabeth Case is a 52 y.o. female.   HPI  52 year old female here for evaluation of respiratory complaints.  The patient reports that her symptoms have been going on for the past 3 days NACSYS of sinus pressure and nasal congestion with green nasal discharge, ear pain that is worse on the right than the left, sore throat, and a cough that is a mixture of productive and nonproductive.  The patient reports that it is rarely productive at all.  She denies any fever, shortness of breath or wheezing, or GI complaints.  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   First degree hemorrhoids    Allergic rhinitis    Migraines    Eczema     Past Surgical History:  Procedure Laterality Date   BOTOX INJECTION N/A 08/06/2022   Procedure: BOTOX INJECTION;  Surgeon: Ronny Bacon, MD;  Location: [facility];  Service: General;  Laterality: N/A;   BREAST BIOPSY     BREAST EXCISIONAL BIOPSY Right    BREAST REDUCTION SURGERY  1994   BREAST SURGERY Right 2012   lumpectomy   CHOLECYSTECTOMY  1998   COLONOSCOPY WITH PROPOFOL N/A 09/08/2016   Procedure: COLONOSCOPY WITH PROPOFOL;  Surgeon: Jonathon Bellows, MD;  Location: [place];  Service: Endoscopy;  Laterality: N/A;  Latex sensitivity   RECTAL EXAM  UNDER ANESTHESIA N/A 08/06/2022   Procedure: RECTAL EXAM UNDER ANESTHESIA;  Surgeon: Ronny Bacon, MD;  Location: [facility];  Service: General;  Laterality: N/A;   REDUCTION MAMMAPLASTY Bilateral 1994   SPHINCTEROTOMY N/A 08/06/2022   Procedure: SPHINCTEROTOMY, chemical;  Surgeon: Ronny Bacon, MD;  Location: [facility];  Service: General;  Laterality: N/A;   WISDOM TOOTH EXTRACTION      OB History     Gravida  2   Para  2   Term  2   Preterm      AB      Living  2      SAB      IAB      Ectopic      Multiple      Live Births  2        Obstetric Comments  1st Menstrual Cycle: 14  1st Pregnancy:  26           Home Medications    Prior to Admission medications   Medication Sig Start Date End Date Taking? Authorizing Provider  benzonatate (TESSALON) 100 MG capsule Take 2 capsules (200 mg total) by mouth every 8 (eight) hours. 12/11/22  Yes Margarette Canada, NP  ipratropium (ATROVENT) 0.06 % nasal spray Place 2 sprays into both nostrils 4 (four) times daily. 12/11/22  Yes Margarette Canada, NP  promethazine-dextromethorphan (PROMETHAZINE-DM) 6.25-15 MG/5ML syrup  Take 5 mLs by mouth 4 (four) times daily as needed. 12/11/22  Yes Margarette Canada, NP  Crisaborole (EUCRISA) 2 % OINT Apply a thin film to affected area(s) 2 times daily. Patient taking differently: as needed. Apply a thin film to affected area(s) 2 times daily. 06/29/21   Cannady, Henrine Screws T, NP  desonide (DESOWEN) 0.05 % cream Apply topically 2 (two) times daily. Patient taking differently: Apply topically as needed. 07/15/22   Mecum, Erin E, PA-C  dicyclomine (BENTYL) 10 MG capsule Take 1 capsule (10 mg total) by mouth 4 (four) times daily -  before meals and at bedtime. Patient taking differently: Take 10 mg by mouth as needed. 02/15/22   Johnson, Megan P, DO  ibuprofen (ADVIL) 200 MG tablet Take 200 mg by mouth every 6 (six) hours as needed. 4 tablets    [provider]  ketoconazole (NIZORAL) 2 % shampoo  Apply 1 Application topically 2 (two) times a week. Patient taking differently: Apply 1 Application topically as needed. 05/24/22   [name], NP  levonorgestrel (MIRENA) 20 MCG/DAY IUD 1 each by Intrauterine route once for 1 dose. [id number]   Copland, Alicia B, PA-C  polyethylene glycol powder (GLYCOLAX/MIRALAX) 17 GM/SCOOP powder Take 1 Container by mouth daily.    [provider]  triamcinolone (KENALOG) 0.025 % cream Apply 1 application topically 2 (two) times daily. 08/17/22   Venita Lick, NP    Family History Family History  Problem Relation Age of Onset   Hypertension Mother    Pulmonary fibrosis Mother    Hypertension Father    Cancer Father 44       prostate    Stroke Maternal Grandmother    Cancer Paternal Grandmother        breast   Breast cancer Paternal Grandmother    Stroke Paternal Grandfather        cerebral aneurysm caused the stroke   Aneurysm Paternal Grandfather    Diabetes Neg Hx    Heart disease Neg Hx     Social History Social History   Tobacco Use   Smoking status: Never    Passive exposure: Never   Smokeless tobacco: Never  Vaping Use   Vaping Use: Never used  Substance Use Topics   Alcohol use: Yes    Alcohol/week: 0.0 standard drinks of alcohol    Comment: holidays   Drug use: No     Allergies   Kiwi extract, Latex, and Other   Review of Systems Review of Systems  Constitutional:  Negative for fever.  HENT:  Positive for congestion, ear pain, postnasal drip, rhinorrhea, sinus pressure and sore throat.   Respiratory:  Positive for cough. Negative for shortness of breath and wheezing.   Gastrointestinal:  Negative for diarrhea, nausea and vomiting.  Skin:  Negative for rash.  Hematological: Negative.   Psychiatric/Behavioral: Negative.       Physical Exam Triage Vital Signs ED Triage Vitals  Enc Vitals Group     BP 12/11/22 0812 115/84     Pulse Rate 12/11/22 0812 92     Resp 12/11/22 0812 16     Temp  12/11/22 0812 97.8 F (36.6 C)     Temp Source 12/11/22 0812 Oral     SpO2 12/11/22 0812 98 %     Weight 12/11/22 0812 184 lb (83.5 kg)     Height 12/11/22 0812 5' 8"$  (1.727 m)     Head Circumference --      Peak Flow --  Pain Score 12/11/22 0810 0     Pain Loc --      Pain Edu? --      Excl. in [place]? --    No data found.  Updated Vital Signs BP 115/84 (BP Location: Left Arm)   Pulse 92   Temp 97.8 F (36.6 C) (Oral)   Resp 16   Ht 5' 8"$  (1.727 m)   Wt 184 lb (83.5 kg)   LMP 12/07/2022   SpO2 98%   BMI 27.98 kg/m   Visual Acuity Right Eye Distance:   Left Eye Distance:   Bilateral Distance:    Right Eye Near:   Left Eye Near:    Bilateral Near:     Physical Exam Vitals and nursing note reviewed.  Constitutional:      Appearance: Normal appearance. She is not ill-appearing.  HENT:     Head: Normocephalic and atraumatic.     Right Ear: Tympanic membrane, ear canal and external ear normal. There is no impacted cerumen.     Left Ear: Tympanic membrane, ear canal and external ear normal. There is no impacted cerumen.     Nose: Congestion and rhinorrhea present.     Comments: Nasal mucosa is erythematous and mildly edematous with clear discharge in both nares.    Mouth/Throat:     Mouth: Mucous membranes are moist.     Pharynx: Oropharynx is clear. Posterior oropharyngeal erythema present. No oropharyngeal exudate.     Comments: Tonsillar pillars are unremarkable.  Posterior oropharynx is erythematous and injected with clear postnasal drip. Cardiovascular:     Rate and Rhythm: Normal rate and regular rhythm.     Pulses: Normal pulses.     Heart sounds: Normal heart sounds. No murmur heard.    No friction rub. No gallop.  Pulmonary:     Effort: Pulmonary effort is normal.     Breath sounds: Normal breath sounds. No wheezing, rhonchi or rales.  Musculoskeletal:     Cervical back: Normal range of motion and neck supple.  Lymphadenopathy:     Cervical: No  cervical adenopathy.  Skin:    General: Skin is warm and dry.     Capillary Refill: Capillary refill takes less than 2 seconds.     Findings: No erythema or rash.  Neurological:     General: No focal deficit present.     Mental Status: She is alert and oriented to person, place, and time.  Psychiatric:        Mood and Affect: Mood normal.        Behavior: Behavior normal.        Thought Content: Thought content normal.        Judgment: Judgment normal.      UC Treatments / Results  Labs (all labs ordered are listed, but only abnormal results are displayed) Labs Reviewed  SARS CORONAVIRUS 2 BY RT PCR - Abnormal; Notable for the following components:      Result Value   SARS Coronavirus 2 by RT PCR POSITIVE (*)    All other components within normal limits    EKG   Radiology No results found.  Procedures Procedures (including critical care time)  Medications Ordered in UC Medications - No data to display  Initial Impression / Assessment and Plan / UC Course  I have reviewed the triage vital signs and the nursing notes.  Pertinent labs & imaging results that were available during my care of the patient were reviewed by me and considered  in my medical decision making (see chart for details).   Patient is a nontoxic-appearing 52 year old female here for evaluation of 3 days worth of respiratory symptoms as outlined in HPI above.  She has been using over-the-counter Afrin for the past several days which I think is assisting in the presentation of her nasal congestion.  Her discharge is still clear and I do not feel that it is bacterial.  Her presentation is more viral.  She has had symptoms for 3 days and she has not been febrile so I want to swab her for flu at this time because she is outside the therapeutic window for Tamiflu.  I will however order a COVID PCR.  She has no tonsillar edema, erythema, or exudate so I do not feel strep is warranted at this time.  PCR is  positive.  I will discharge patient home with diagnosis of COVID-19 and have her quarantine for 5 days from onset of her symptoms.  I will prescribe Atrovent nasal spray to help with her nasal congestion and Tessalon Perles and Promethazine DM to help with cough and congestion.  She can use over-the-counter Tylenol and/or ibuprofen according to the package instructions as needed for pain or fever.  ER precautions reviewed.  Work note provided.   Final Clinical Impressions(s) / UC Diagnoses   Final diagnoses:  [id number]     Discharge Instructions      You have tested positive for COVID-19 today.  You will need to quarantine for 5 days from onset of your symptoms.  After the 5 days you can break quarantine if your symptoms have improved and you have not had a fever for 24 hours without taking Tylenol and/or ibuprofen.  You must wear a mask around others for an additional 5 days.  Take over-the-counter Tylenol and/or ibuprofen according to the package instructions as needed for fever or pain.  Use the Atrovent nasal spray, 2 squirts in each nostril every 6 hours, as needed for runny nose and postnasal drip.  Use the Tessalon Perles every 8 hours during the day.  Take them with a small sip of water.  They may give you some numbness to the base of your tongue or a metallic taste in your mouth, this is normal.  Use the Promethazine DM cough syrup at bedtime for cough and congestion.  It will make you drowsy so do not take it during the day.  If you develop any shortness of breath, especially at rest, feel as though you cannot catch your breath, you are unable to speak in full sentences, or your lips begin turning blue you need to call 911 or go to the ER.     ED Prescriptions     Medication Sig Dispense Auth. Provider   benzonatate (TESSALON) 100 MG capsule Take 2 capsules (200 mg total) by mouth every 8 (eight) hours. 21 capsule Margarette Canada, NP   ipratropium (ATROVENT) 0.06 % nasal spray  Place 2 sprays into both nostrils 4 (four) times daily. 15 mL Margarette Canada, NP   promethazine-dextromethorphan (PROMETHAZINE-DM) 6.25-15 MG/5ML syrup Take 5 mLs by mouth 4 (four) times daily as needed. 118 mL Margarette Canada, NP      PDMP not reviewed this encounter.   Margarette Canada, NP 12/11/22 [phone]

## 2023-01-25 ENCOUNTER — Ambulatory Visit: Payer: BC Managed Care – PPO | Admitting: Unknown Physician Specialty

## 2023-01-25 ENCOUNTER — Encounter: Payer: Self-pay | Admitting: Unknown Physician Specialty

## 2023-01-25 VITALS — BP 138/75 | HR 87 | Temp 97.5°F | Wt 190.2 lb

## 2023-01-25 DIAGNOSIS — J0100 Acute maxillary sinusitis, unspecified: Secondary | ICD-10-CM | POA: Diagnosis not present

## 2023-01-25 DIAGNOSIS — F5101 Primary insomnia: Secondary | ICD-10-CM

## 2023-01-25 DIAGNOSIS — G4700 Insomnia, unspecified: Secondary | ICD-10-CM | POA: Insufficient documentation

## 2023-01-25 MED ORDER — FLUCONAZOLE 150 MG PO TABS: 150.0000 mg | ORAL_TABLET | Freq: Once | ORAL | 0 refills | Status: AC

## 2023-01-25 MED ORDER — AMOXICILLIN-POT CLAVULANATE 875-125 MG PO TABS: 1.0000 | ORAL_TABLET | Freq: Two times a day (BID) | ORAL | 0 refills | Status: DC

## 2023-01-25 NOTE — Progress Notes (Signed)
BP 138/75   Pulse 87   Temp (!) 97.5 F (36.4 C) (Oral)   Wt 190 lb 3.2 oz (86.3 kg)   SpO2 98%   BMI 28.92 kg/m    Subjective:    Patient ID: Elizabeth Case, female    DOB: Apr 20, 1971, 52 y.o.   MRN: [id number]  HPI: Elizabeth MOLINAR is a 52 y.o. female  Chief Complaint  Patient presents with   URI    Pt states she has been having issues with congestion and sinus pressure for the last month. States symptoms just come and go. Also started having ear pressure as well.    URI  This is a new problem. The current episode started more than 1 year ago. The problem has been waxing and waning (worse in the last week). There has been no fever. Associated symptoms include congestion, coughing, headaches and sinus pain. Pertinent negatives include no abdominal pain, chest pain, joint pain, joint swelling, nausea, sore throat or swollen glands. She has tried antihistamine and decongestant (Flonase) for the symptoms. The treatment provided no relief.   Insomnia-   Relevant past medical, surgical, family and social history reviewed and updated as indicated. Interim medical history since our last visit reviewed. Allergies and medications reviewed and updated.  Review of Systems  HENT:  Positive for congestion and sinus pain. Negative for sore throat.   Respiratory:  Positive for cough.   Cardiovascular:  Negative for chest pain.  Gastrointestinal:  Negative for abdominal pain and nausea.  Musculoskeletal:  Negative for joint pain.  Neurological:  Positive for headaches.    Per HPI unless specifically indicated above     Objective:    BP 138/75   Pulse 87   Temp (!) 97.5 F (36.4 C) (Oral)   Wt 190 lb 3.2 oz (86.3 kg)   SpO2 98%   BMI 28.92 kg/m   Wt Readings from Last 3 Encounters:  01/25/23 190 lb 3.2 oz (86.3 kg)  12/11/22 184 lb (83.5 kg)  11/23/22 190 lb (86.2 kg)    Physical Exam Constitutional:      General: She is not in acute distress.    Appearance: Normal  appearance. She is well-developed.  HENT:     Head: Normocephalic and atraumatic.     Right Ear: Tympanic membrane and ear canal normal.     Left Ear: Tympanic membrane and ear canal normal.     Nose: No rhinorrhea.     Right Sinus: Maxillary sinus tenderness present. No frontal sinus tenderness.     Left Sinus: Maxillary sinus tenderness present. No frontal sinus tenderness.  Eyes:     General: Lids are normal. No scleral icterus.       Right eye: No discharge.        Left eye: No discharge.     Conjunctiva/sclera: Conjunctivae normal.  Cardiovascular:     Rate and Rhythm: Normal rate and regular rhythm.  Pulmonary:     Effort: Pulmonary effort is normal. No respiratory distress.     Breath sounds: Normal breath sounds.  Abdominal:     Palpations: There is no hepatomegaly or splenomegaly.  Musculoskeletal:        General: Normal range of motion.  Skin:    Coloration: Skin is not pale.     Findings: No rash.  Neurological:     Mental Status: She is alert and oriented to person, place, and time.  Psychiatric:        Behavior: Behavior  normal.        Thought Content: Thought content normal.        Judgment: Judgment normal.     Results for orders placed or performed during the hospital encounter of 12/11/22  SARS Coronavirus 2 by RT PCR (hospital order, performed in [facility] hospital lab) *cepheid single result test* Anterior Nasal Swab   Specimen: Anterior Nasal Swab  Result Value Ref Range   SARS Coronavirus 2 by RT PCR POSITIVE (A) NEGATIVE      Assessment & Plan:   Problem List Items Addressed This Visit       Unprioritized   Insomnia    Wakes up at 4a and can't get back to sleep.  Discussed magnesium, sleep hygiene, extended release melatonin, and yoga nidra.        Other Visit Diagnoses     Acute non-recurrent maxillary sinusitis    -  Primary   Sinusitis with 1 month of non-resolved symptoms, worse in the last week.  Augmentin 850 mg BID for 10 days with  Diflucan 150 mg as typically get yeast inf.   Relevant Medications   amoxicillin-clavulanate (AUGMENTIN) 875-125 MG tablet   fluconazole (DIFLUCAN) 150 MG tablet        Follow up plan: Return if symptoms worsen or fail to improve.

## 2023-01-25 NOTE — Assessment & Plan Note (Signed)
Wakes up at 4a and can't get back to sleep.  Discussed magnesium, sleep hygiene, extended release melatonin, and yoga nidra.

## 2023-02-08 ENCOUNTER — Other Ambulatory Visit: Payer: Self-pay

## 2023-02-08 ENCOUNTER — Ambulatory Visit: Payer: Self-pay | Admitting: *Deleted

## 2023-02-08 ENCOUNTER — Emergency Department
Admission: EM | Admit: 2023-02-08 | Discharge: 2023-02-08 | Disposition: A | Discharge status: Home / Self Care | Payer: BC Managed Care – PPO | Attending: Emergency Medicine | Admitting: Emergency Medicine

## 2023-02-08 DIAGNOSIS — E860 Dehydration: Secondary | ICD-10-CM | POA: Insufficient documentation

## 2023-02-08 DIAGNOSIS — R55 Syncope and collapse: Secondary | ICD-10-CM | POA: Diagnosis present

## 2023-02-08 LAB — CBC
HCT: 44 % (ref 36.0–46.0)
Hemoglobin: 15 g/dL (ref 12.0–15.0)
MCH: 30.1 pg (ref 26.0–34.0)
MCHC: 34.1 g/dL (ref 30.0–36.0)
MCV: 88.4 fL (ref 80.0–100.0)
Platelets: 264 10*3/uL (ref 150–400)
RBC: 4.98 MIL/uL (ref 3.87–5.11)
RDW: 12.2 % (ref 11.5–15.5)
WBC: 19.3 10*3/uL — ABNORMAL HIGH (ref 4.0–10.5)
nRBC: 0 % (ref 0.0–0.2)

## 2023-02-08 LAB — BASIC METABOLIC PANEL
Anion gap: 6 (ref 5–15)
BUN: 10 mg/dL (ref 6–20)
CO2: 24 mmol/L (ref 22–32)
Calcium: 8.8 mg/dL — ABNORMAL LOW (ref 8.9–10.3)
Chloride: 107 mmol/L (ref 98–111)
Creatinine, Ser: 0.72 mg/dL (ref 0.44–1.00)
GFR, Estimated: >60 mL/min (ref >60)
Glucose, Bld: 118 mg/dL — ABNORMAL HIGH (ref 70–99)
Potassium: 3.5 mmol/L (ref 3.5–5.1)
Sodium: 137 mmol/L (ref 135–145)

## 2023-02-08 MED ORDER — SODIUM CHLORIDE 0.9 % IV SOLN: Freq: Once | INTRAVENOUS | Status: AC

## 2023-02-08 MED ORDER — KETOROLAC TROMETHAMINE 30 MG/ML IJ SOLN
30.0000 mg | Freq: Once | INTRAMUSCULAR | Status: AC
  Administered 2023-02-08: 30 mg via INTRAVENOUS
  Filled 2023-02-08: qty 1

## 2023-02-08 NOTE — Telephone Encounter (Signed)
Noted, sounds like IV fluids may be needed.  Agree with plan of care.  Follow-up after ER.

## 2023-02-08 NOTE — Telephone Encounter (Signed)
  Chief Complaint: "Passed out." Symptoms: States nausea, cold and clammy at 4am, passed out. Diarrhea every 2 hours last night "[place]." Completed course of antibiotics last Thursday. "Bad" headache, sore shoulder s/p fall. H/O PVCs "Low BP." Frequency: This AM Pertinent Negatives: Patient denies  Disposition: [x] ED /[] Urgent Care (no appt availability in office) / [] Appointment(In office/virtual)/ []  [facility]/ [] Home Care/ [] Refused Recommended Disposition /[] [facility]/ []  Follow-up with PCP Additional Notes: Directed pt to ED, states will follow disposition. Reason for Disposition  [1] Drinking very little AND [2] dehydration suspected (e.g., no urine > 12 hours, very dry mouth, very lightheaded)  Answer Assessment - Initial Assessment Questions 1. ONSET: "How long were you unconscious?" (minutes) "When did it happen?"     Unsure, "Not long, I stood back up but don't remember." 2. CONTENT: "What happened during period of unconsciousness?" (e.g., seizure activity)      No 3. MENTAL STATUS: "Alert and oriented now?" (oriented x 3 = name, month, location)      Yes 4. TRIGGER: "What do you think caused the fainting?" "What were you doing just before you fainted?"  (e.g., exercise, sudden standing up, prolonged standing)     Unsure 5. RECURRENT SYMPTOM: "Have you ever passed out before?" If Yes, ask: "When was the last time?" and "What happened that time?"       6. INJURY: "Did you sustain any injury during the fall?"      Left shoulder pain, "Bad"headache 7. CARDIAC SYMPTOMS: "Have you had any of the following symptoms: chest pain, difficulty breathing, palpitations?"     H/O PVCs 8. NEUROLOGIC SYMPTOMS: "Have you had any of the following symptoms: headache, numbness, vertigo, weakness?"      9. GI SYMPTOMS: "Have you had any of the following symptoms: abdomen pain, vomiting, diarrhea, blood in stools?"     Diarrhea, nausea 10. OTHER SYMPTOMS: "Do you have any  other symptoms?"       Cold, clammy  Protocols used: Fainting-A-AH

## 2023-02-08 NOTE — ED Triage Notes (Addendum)
Pt here with a LOC at 0415 this morning. Pt had diarrhea and was headed to the bathroom and broke out in a sweat and got dizzy and woke up on the floor. Pt c/o right shoulder pain. Pt denies hitting her head but does have a headache.

## 2023-02-08 NOTE — ED Provider Notes (Signed)
[facility] Provider Note    Event Date/Time   First MD Initiated Contact with Patient 02/08/23 0920     (approximate)   History   Loss of Consciousness   HPI  Elizabeth Case is a 52 y.o. female who presents after a syncopal episode.  Patient reports that she has been having diarrhea throughout the night, she was on the toilet early this morning, went to stand up after feeling lightheaded and apparently syncopized.  She denies serious injury, has a small bruise to her right shoulder.  Continues to feel mildly lightheaded.  No chest pain or palpitations     Physical Exam   Triage Vital Signs: ED Triage Vitals  Enc Vitals Group     BP 02/08/23 0857 (!) 128/93     Pulse Rate 02/08/23 0857 (!) 125     Resp 02/08/23 0857 18     Temp 02/08/23 0857 98.4 F (36.9 C)     Temp Source 02/08/23 0857 Oral     SpO2 02/08/23 0857 99 %     Weight 02/08/23 0858 86.3 kg (190 lb 4.1 oz)     Height 02/08/23 0858 1.727 m (5\' 8" )     Head Circumference --      Peak Flow --      Pain Score 02/08/23 0858 6     Pain Loc --      Pain Edu? --      Excl. in [place]? --     Most recent vital signs: Vitals:   02/08/23 0857  BP: (!) 128/93  Pulse: (!) 125  Resp: 18  Temp: 98.4 F (36.9 C)  SpO2: 99%     General: Awake, no distress.  CV:  Good peripheral perfusion.  Regular rate and rhythm Resp:  Normal effort.  Abd:  No distention.  Soft, nontender Other:  Mild bruise to the right shoulder, normal range of motion   ED Results / Procedures / Treatments   Labs (all labs ordered are listed, but only abnormal results are displayed) Labs Reviewed  BASIC METABOLIC PANEL - Abnormal; Notable for the following components:      Result Value   Glucose, Bld 118 (*)    Calcium 8.8 (*)    All other components within normal limits  CBC - Abnormal; Notable for the following components:   WBC 19.3 (*)    All other components within normal limits  URINALYSIS, ROUTINE W  REFLEX MICROSCOPIC  CBG MONITORING, ED  POC URINE PREG, ED     EKG  ED ECG REPORT I, Jene Every, the attending physician, personally viewed and interpreted this ECG.  Date: 02/08/2023  Rhythm: normal sinus rhythm QRS Axis: normal Intervals: normal ST/T Wave abnormalities: normal Narrative Interpretation: no evidence of acute ischemia    RADIOLOGY     PROCEDURES:  Critical Care performed:   Procedures   MEDICATIONS ORDERED IN ED: Medications  0.9 %  sodium chloride infusion (0 mLs Intravenous Stopped 02/08/23 1044)  ketorolac (TORADOL) 30 MG/ML injection 30 mg (30 mg Intravenous Given 02/08/23 0944)     IMPRESSION / MDM / ASSESSMENT AND PLAN / ED COURSE  I reviewed the triage vital signs and the nursing notes. Patient's presentation is most consistent with acute illness / injury with system symptoms.   Patient presents after syncopal episode as detailed above, differential includes vasovagal versus dehydration, no reports of palpitations or cardiac cause.  Are all well-appearing and in no acute distress, vital signs generally  reassuring, mild tachycardia likely related to dehydration, underlying diarrhea which she reports started after taking antibiotics but not foul-smelling  IV fluids given and patient feeling significantly better, she would like to go home to rest, no indication for admission at this time, return precautions discussed       FINAL CLINICAL IMPRESSION(S) / ED DIAGNOSES   Final diagnoses:  Syncope and collapse  Dehydration     Rx / DC Orders   ED Discharge Orders     None        Note:  This document was prepared using Dragon voice recognition software and may include unintentional dictation errors.   Jene Every, MD 02/08/23 1435

## 2023-02-09 ENCOUNTER — Ambulatory Visit: Payer: Self-pay | Admitting: *Deleted

## 2023-02-09 ENCOUNTER — Encounter: Payer: Self-pay | Admitting: Nurse Practitioner

## 2023-02-09 NOTE — Telephone Encounter (Signed)
Scheduled patient for appointment with Dr. Laural Benes on 02/10/2023 @ 11:20 am.

## 2023-02-09 NOTE — Telephone Encounter (Signed)
Summary: Diarrhea Advice   Pt is calling to report that she passed out yesterday - she did go to the ED- yesterday and today reporting diarrhea & unable to eat. No available appts to follow up. Please advise         Chief Complaint: diarrhea Symptoms: lightheaded, dry mouth, lips dry. Abdominal cramping comes and goes. Tolerated water and gatorade last approx 30 minutes and diarrhea occurs. Denies passing out since seen in ED 02/08/23. Took augmentin couple of weeks ago and sx started. Concerned due to elevated WBC count in ED and elevated glucose.  Frequency: 02/03/23 Pertinent Negatives: Patient denies feeling like passing out. No difficulty walking, has not been able to eat . Disposition: [] ED /[] Urgent Care (no appt availability in office) / [] Appointment(In office/virtual)/ []  [facility]/ [] Home Care/ [] Refused Recommended Disposition /[] [facility]/ [x]  Follow-up with PCP Additional Notes:   Recommended going back to ED if sx worsen. No available appt within 24 hours noted. Please advise if appt available today or tomorrow. Patient on waitlist. Please advise.            Reason for Disposition  [1] SEVERE diarrhea (e.g., 7 or more times / day more than normal) AND [2] present > 24 hours (1 day)  Answer Assessment - Initial Assessment Questions 1. DIARRHEA SEVERITY: "How bad is the diarrhea?" "How many more stools have you had in the past 24 hours than normal?"    - NO DIARRHEA (SCALE 0)   - MILD (SCALE 1-3): Few loose or mushy BMs; increase of 1-3 stools over normal daily number of stools; mild increase in ostomy output.   -  MODERATE (SCALE 4-7): Increase of 4-6 stools daily over normal; moderate increase in ostomy output.   -  SEVERE (SCALE 8-10; OR "WORST POSSIBLE"): Increase of 7 or more stools daily over normal; moderate increase in ostomy output; incontinence.     7 times within the last 24 hours seen in ED 02/08/23 2. ONSET: "When did the diarrhea  begin?"      Last Thursday 02/03/23 after finishing antibiotics augmentin 3. BM CONSISTENCY: "How loose or watery is the diarrhea?"      watery 4. VOMITING: "Are you also vomiting?" If Yes, ask: "How many times in the past 24 hours?"      No  5. ABDOMEN PAIN: "Are you having any abdomen pain?" If Yes, ask: "What does it feel like?" (e.g., crampy, dull, intermittent, constant)      Some, comes and goes cramps sharp pains 6. ABDOMEN PAIN SEVERITY: If present, ask: "How bad is the pain?"  (e.g., Scale 1-10; mild, moderate, or severe)   - MILD (1-3): doesn't interfere with normal activities, abdomen soft and not tender to touch    - MODERATE (4-7): interferes with normal activities or awakens from sleep, abdomen tender to touch    - SEVERE (8-10): excruciating pain, doubled over, unable to do any normal activities       Na  7. ORAL INTAKE: If vomiting, "Have you been able to drink liquids?" "How much liquids have you had in the past 24 hours?"     Yes water and gatorade 28 oz.  8. HYDRATION: "Any signs of dehydration?" (e.g., dry mouth [not just dry lips], too weak to stand, dizziness, new weight loss) "When did you last urinate?"     Lightheaded, mouth dry, lips dry. Does not feel like passing out today  9. EXPOSURE: "Have you traveled to a foreign country  recently?" "Have you been exposed to anyone with diarrhea?" "Could you have eaten any food that was spoiled?"     na 10. ANTIBIOTIC USE: "Are you taking antibiotics now or have you taken antibiotics in the past 2 months?"       Yes augmentin completed course a couple of weeks ago  11. OTHER SYMPTOMS: "Do you have any other symptoms?" (e.g., fever, blood in stool)       Watery diarrhea, lightheaded, mouth dry lips dry abdominal cramps comes and goes  12. PREGNANCY: "Is there any chance you are pregnant?" "When was your last menstrual period?"       na  Protocols used: [facility]

## 2023-02-10 ENCOUNTER — Ambulatory Visit: Payer: BC Managed Care – PPO | Admitting: Family Medicine

## 2023-02-10 VITALS — BP 101/67 | HR 85 | Temp 98.2°F | Ht 68.0 in | Wt 186.4 lb

## 2023-02-10 DIAGNOSIS — R197 Diarrhea, unspecified: Secondary | ICD-10-CM | POA: Diagnosis not present

## 2023-02-10 DIAGNOSIS — A0472 Enterocolitis due to Clostridium difficile, not specified as recurrent: Secondary | ICD-10-CM

## 2023-02-10 DIAGNOSIS — D72829 Elevated white blood cell count, unspecified: Secondary | ICD-10-CM | POA: Diagnosis not present

## 2023-02-10 LAB — CBC WITH DIFFERENTIAL/PLATELET
Hematocrit: 41.9 % (ref 34.0–46.6)
Hemoglobin: 14.7 g/dL (ref 11.1–15.9)
Lymphocytes Absolute: 1.3 10*3/uL (ref 0.7–3.1)
Lymphs: 25 %
MCH: 30.6 pg (ref 26.6–33.0)
MCHC: 35.1 g/dL (ref 31.5–35.7)
MCV: 87 fL (ref 79–97)
MID (Absolute): 0.6 10*3/uL (ref 0.1–1.6)
MID: 12 %
Neutrophils Absolute: 3.3 10*3/uL (ref 1.4–7.0)
Neutrophils: 63 %
Platelets: 222 10*3/uL (ref 150–450)
RBC: 4.81 x10E6/uL (ref 3.77–5.28)
RDW: 13 % (ref 11.7–15.4)
WBC: 5.2 10*3/uL (ref 3.4–10.8)

## 2023-02-10 MED ORDER — CYCLOBENZAPRINE HCL 10 MG PO TABS: 10.0000 mg | ORAL_TABLET | Freq: Every day | ORAL | 0 refills | Status: DC

## 2023-02-10 NOTE — Progress Notes (Signed)
BP 101/67   Pulse 85   Temp 98.2 F (36.8 C) (Oral)   Ht 5\' 8"  (1.727 m)   Wt 186 lb 6.4 oz (84.6 kg)   SpO2 96%   BMI 28.34 kg/m    Subjective:    Patient ID: Elizabeth Case, female    DOB: 05-Mar-1971, 52 y.o.   MRN: [id number]  HPI: Elizabeth Case is a 52 y.o. female  Chief Complaint  Patient presents with   Diarrhea    Patient says she was getting better and says she ate something and seems to have come back. Patient says there has been times where it seemed to be getting better and then back worse.    Loss of Consciousness    Patient says Tuesday morning, she passed out and went to the ER. Patient says she was informed that her WBC was elevated and was given fluids as she was told she was dehydrated.   Flank Pain   Dizziness   ER FOLLOW UP Time since discharge: 2 days Hospital/facility: [facility] Diagnosis: syncope and diarrhea Procedures/tests: Blood work only, no imaging  Consultants: None New medications: None Discharge instructions: Follow up here  Status: stable  ABDOMINAL ISSUES Duration: about a week Nature: diarrhea, gurrgly  Location: R side  Severity: severe  Radiation: no Treatments attempted: fluids Constipation: no Diarrhea: yes Episodes of diarrhea/day: 6-7x a day Mucous in the stool: no Heartburn: no Bloating:yes Flatulence: yes Nausea: yes Vomiting: no Melena or hematochezia: yes- has a history of fissure Rash: no Jaundice: no Fever: no Weight loss: yes   Relevant past medical, surgical, family and social history reviewed and updated as indicated. Interim medical history since our last visit reviewed. Allergies and medications reviewed and updated.  Review of Systems  Constitutional: Negative.   Respiratory: Negative.    Cardiovascular: Negative.   Gastrointestinal:  Positive for blood in stool, diarrhea and nausea. Negative for abdominal distention, abdominal pain, anal bleeding, constipation, rectal pain and vomiting.   Musculoskeletal: Negative.   Neurological: Negative.   Psychiatric/Behavioral: Negative.      Per HPI unless specifically indicated above     Objective:    BP 101/67   Pulse 85   Temp 98.2 F (36.8 C) (Oral)   Ht 5\' 8"  (1.727 m)   Wt 186 lb 6.4 oz (84.6 kg)   SpO2 96%   BMI 28.34 kg/m   Wt Readings from Last 3 Encounters:  02/10/23 186 lb 6.4 oz (84.6 kg)  02/08/23 190 lb 4.1 oz (86.3 kg)  01/25/23 190 lb 3.2 oz (86.3 kg)    Physical Exam Vitals and nursing note reviewed.  Constitutional:      General: She is not in acute distress.    Appearance: Normal appearance. She is not ill-appearing, toxic-appearing or diaphoretic.  HENT:     Head: Normocephalic and atraumatic.     Right Ear: External ear normal.     Left Ear: External ear normal.     Nose: Nose normal.     Mouth/Throat:     Mouth: Mucous membranes are moist.     Pharynx: Oropharynx is clear.  Eyes:     General: No scleral icterus.       Right eye: No discharge.        Left eye: No discharge.     Extraocular Movements: Extraocular movements intact.     Conjunctiva/sclera: Conjunctivae normal.     Pupils: Pupils are equal, round, and reactive to light.  Cardiovascular:  Rate and Rhythm: Normal rate and regular rhythm.     Pulses: Normal pulses.     Heart sounds: Normal heart sounds. No murmur heard.    No friction rub. No gallop.  Pulmonary:     Effort: Pulmonary effort is normal. No respiratory distress.     Breath sounds: Normal breath sounds. No stridor. No wheezing, rhonchi or rales.  Chest:     Chest wall: No tenderness.  Abdominal:     General: Abdomen is flat. Bowel sounds are normal. There is no distension.     Palpations: Abdomen is soft. There is no mass.     Tenderness: There is no abdominal tenderness. There is no right CVA tenderness, left CVA tenderness, guarding or rebound.     Hernia: No hernia is present.  Musculoskeletal:        General: Normal range of motion.     Cervical  back: Normal range of motion and neck supple.  Skin:    General: Skin is warm and dry.     Capillary Refill: Capillary refill takes less than 2 seconds.     Coloration: Skin is not jaundiced or pale.     Findings: No bruising, erythema, lesion or rash.  Neurological:     General: No focal deficit present.     Mental Status: She is alert and oriented to person, place, and time. Mental status is at baseline.  Psychiatric:        Mood and Affect: Mood normal.        Behavior: Behavior normal.        Thought Content: Thought content normal.        Judgment: Judgment normal.     Results for orders placed or performed during the hospital encounter of 02/08/23  Basic metabolic panel  Result Value Ref Range   Sodium 137 135 - 145 mmol/L   Potassium 3.5 3.5 - 5.1 mmol/L   Chloride 107 98 - 111 mmol/L   CO2 24 22 - 32 mmol/L   Glucose, Bld 118 (H) 70 - 99 mg/dL   BUN 10 6 - 20 mg/dL   Creatinine, Ser 2.130.72 0.44 - 1.00 mg/dL   Calcium 8.8 (L) 8.9 - 10.3 mg/dL   GFR, Estimated >08>60 >65>60 mL/min   Anion gap 6 5 - 15  CBC  Result Value Ref Range   WBC 19.3 (H) 4.0 - 10.5 K/uL   RBC 4.98 3.87 - 5.11 MIL/uL   Hemoglobin 15.0 12.0 - 15.0 g/dL   HCT 78.444.0 69.636.0 - 29.546.0 %   MCV 88.4 80.0 - 100.0 fL   MCH 30.1 26.0 - 34.0 pg   MCHC 34.1 30.0 - 36.0 g/dL   RDW 28.412.2 13.211.5 - 44.015.5 %   Platelets 264 150 - 400 K/uL   nRBC 0.0 0.0 - 0.2 %      Assessment & Plan:   Problem List Items Addressed This Visit   None Visit Diagnoses     Diarrhea of presumed infectious origin    -  Primary   Will check labs and stool studies. Gentle diet. If not significantly better by tomorrow, will order CT abdomen.   Relevant Orders   Comprehensive metabolic panel   Fecal occult blood, imunochemical([facility])   Stool C-Diff Toxin Assay   Stool Culture   Fecal leukocytes   Ova and parasite examination   CBC With Differential/Platelet   Leukocytosis, unspecified type       Better on recheck today. Continue  to monitor.  Relevant Orders   CBC With Differential/Platelet        Follow up plan: Return in about 2 weeks (around 02/24/2023) for with PCP for physcial and follow up.

## 2023-02-11 LAB — COMPREHENSIVE METABOLIC PANEL
ALT: 11 IU/L (ref 0–32)
AST: 15 IU/L (ref 0–40)
Albumin/Globulin Ratio: 1.8 (ref 1.2–2.2)
Albumin: 4.2 g/dL (ref 3.8–4.9)
Alkaline Phosphatase: 75 IU/L (ref 44–121)
BUN/Creatinine Ratio: 8 — ABNORMAL LOW (ref 9–23)
BUN: 5 mg/dL — ABNORMAL LOW (ref 6–24)
Bilirubin Total: 0.2 mg/dL (ref 0.0–1.2)
CO2: 23 mmol/L (ref 20–29)
Calcium: 9.1 mg/dL (ref 8.7–10.2)
Chloride: 102 mmol/L (ref 96–106)
Creatinine, Ser: 0.65 mg/dL (ref 0.57–1.00)
Globulin, Total: 2.3 g/dL (ref 1.5–4.5)
Glucose: 100 mg/dL — ABNORMAL HIGH (ref 70–99)
Potassium: 3.9 mmol/L (ref 3.5–5.2)
Sodium: 139 mmol/L (ref 134–144)
Total Protein: 6.5 g/dL (ref 6.0–8.5)
eGFR: 107 mL/min/{1.73_m2} (ref >59)

## 2023-02-13 LAB — STOOL CULTURE: E coli, Shiga toxin Assay: NEGATIVE

## 2023-02-13 LAB — CLOSTRIDIUM DIFFICILE EIA: C difficile Toxins A+B, EIA: POSITIVE — AB

## 2023-02-14 ENCOUNTER — Telehealth: Payer: Self-pay | Admitting: Family Medicine

## 2023-02-14 MED ORDER — METRONIDAZOLE 500 MG PO TABS: 500.0000 mg | ORAL_TABLET | Freq: Three times a day (TID) | ORAL | 0 refills | Status: DC

## 2023-02-14 NOTE — Telephone Encounter (Signed)
Called patient at 1PM

## 2023-02-14 NOTE — Telephone Encounter (Signed)
Copied from CRM [id number]. Topic: General - Inquiry >> Feb 14, 2023  8:19 AM De Blanch wrote: Reason for CRM:Pt is calling to f/u on recent labs. Pt is requesting a callback as soon as labs are available. Stated she cannot return to work without hearing from PCP.  Please advise.

## 2023-02-14 NOTE — Telephone Encounter (Signed)
Called patient to discuss +c diff results. Will treat with flagyl. If not significantly better by Wednesday will change to PO Vanco.

## 2023-02-16 ENCOUNTER — Encounter: Payer: Self-pay | Admitting: Family Medicine

## 2023-02-16 LAB — STOOL CULTURE

## 2023-02-16 LAB — FECAL LEUKOCYTES

## 2023-02-16 LAB — OVA AND PARASITE EXAMINATION

## 2023-02-18 ENCOUNTER — Other Ambulatory Visit: Payer: Self-pay | Admitting: Family Medicine

## 2023-02-18 DIAGNOSIS — A0472 Enterocolitis due to Clostridium difficile, not specified as recurrent: Secondary | ICD-10-CM

## 2023-03-01 ENCOUNTER — Other Ambulatory Visit: Payer: Self-pay | Admitting: Family Medicine

## 2023-03-01 DIAGNOSIS — A0472 Enterocolitis due to Clostridium difficile, not specified as recurrent: Secondary | ICD-10-CM

## 2023-03-03 LAB — CLOSTRIDIUM DIFFICILE EIA: C difficile Toxins A+B, EIA: NEGATIVE

## 2023-05-20 ENCOUNTER — Ambulatory Visit: Payer: BC Managed Care – PPO | Admitting: Cardiology

## 2023-05-22 NOTE — Patient Instructions (Signed)
Please call to schedule your mammogram and/or bone density -- after 07/22/23: [facility]  Address: [address], [place], [place] [address] Phone: [phone]  [facility] [address] [place],  [place]  [address] Phone: [phone]   Migraine Headache A migraine headache is a very strong throbbing pain on one or both sides of your head. This type of headache can also cause other symptoms. It can last from 4 hours to 3 days. Talk with your doctor about what things may bring on (trigger) this condition. What are the causes? The exact cause of a migraine is not known. This condition may be brought on or caused by: Smoking. Medicines, such as: Medicine used to treat chest pain (nitroglycerin). Birth control pills. Estrogen. Some blood pressure medicines. Certain substances in some foods or drinks. Foods and drinks, such as: Cheese. Chocolate. Alcohol. Caffeine. Doing physical activity that is very hard. Other things that may trigger a migraine headache include: Periods. Pregnancy. Hunger. Stress. Getting too much or too little sleep. Weather changes. Feeling tired (fatigue). What increases the risk? Being 17-82 years old. Being female. Having a family history of migraine headaches. Being Caucasian. Having a mental health condition, such as being sad (depressed) or feeling worried or nervous (anxious). Being very overweight (obese). What are the signs or symptoms? A throbbing pain. This pain may: Happen in any area of the head, such as on one or both sides. Make it hard to do daily activities. Get worse with physical activity. Get worse around bright lights, loud noises, or smells. Other symptoms may include: Feeling like you may vomit (nauseous). Vomiting. Dizziness. Before a migraine headache starts, you may get warning signs (an aura). An aura may include: Seeing flashing lights or  having blind spots. Seeing bright spots, halos, or zigzag lines. Having tunnel vision or blurred vision. Having numbness or a tingling feeling. Having trouble talking. Having weak muscles. After a migraine ends, you may have symptoms. These may include: Tiredness. Trouble thinking (concentrating). How is this treated? Taking medicines that: Relieve pain. Relieve the feeling like you may vomit. Prevent migraine headaches. Treatment may also include: Acupuncture. Lifestyle changes like avoiding foods that bring on migraine headaches. Learning ways to control your body functions (biofeedback). Therapy to help you know and deal with negative thoughts (cognitive behavioral therapy). Follow these instructions at home: Medicines Take over-the-counter and prescription medicines only as told by your doctor. If told, take steps to prevent problems with pooping (constipation). You may need to: Drink enough fluid to keep your pee (urine) pale yellow. Take medicines. You will be told what medicines to take. Eat foods that are high in fiber. These include beans, whole grains, and fresh fruits and vegetables. Limit foods that are high in fat and sugar. These include fried or sweet foods. Ask your doctor if you should avoid driving or using machines while you are taking your medicine. Lifestyle  Do not drink alcohol. Do not smoke or use any products that contain nicotine or tobacco. If you need help quitting, ask your doctor. Get 7-9 hours of sleep each night, or the amount recommended by your doctor. Find ways to deal with stress, such as meditation, deep breathing, or yoga. Try to exercise often. This can help lessen how bad and how often your migraines happen. General instructions Keep a journal to find out what may bring on your migraine headaches. This can help you avoid those things.  For example, write down: What you eat and drink. How much sleep you get. Any change to your medicines or  diet. If you have a migraine headache: Avoid things that make your symptoms worse, such as bright lights. Lie down in a dark, quiet room. Do not drive or use machinery. Ask your doctor what activities are safe for you. Where to find more information Coalition for Headache and Migraine Patients (CHAMP): headachemigraine.org American Migraine Foundation: americanmigrainefoundation.org National Headache Foundation: headaches.org Contact a doctor if: You get a migraine headache that is different or worse than others you have had. You have more than 15 days of headaches in one month. Get help right away if: Your migraine headache gets very bad. Your migraine headache lasts more than 72 hours. You have a fever or stiff neck. You have trouble seeing. Your muscles feel weak or like you cannot control them. You lose your balance a lot. You have trouble walking. You faint. You have a seizure. This information is not intended to replace advice given to you by your health care provider. Make sure you discuss any questions you have with your health care provider. Document Revised: 06/14/2022 Document Reviewed: 06/14/2022 Elsevier Patient Education  2024 [employer].

## 2023-05-26 ENCOUNTER — Encounter: Payer: Self-pay | Admitting: Nurse Practitioner

## 2023-05-26 ENCOUNTER — Ambulatory Visit (INDEPENDENT_AMBULATORY_CARE_PROVIDER_SITE_OTHER): Payer: BC Managed Care – PPO | Admitting: Nurse Practitioner

## 2023-05-26 ENCOUNTER — Other Ambulatory Visit (HOSPITAL_COMMUNITY)
Admission: RE | Admit: 2023-05-26 | Discharge: 2023-05-26 | Disposition: A | Discharge status: Home / Self Care | Payer: BC Managed Care – PPO | Source: Ambulatory Visit | Attending: Nurse Practitioner | Admitting: Nurse Practitioner

## 2023-05-26 VITALS — BP 118/75 | HR 89 | Temp 98.0°F | Ht 68.5 in | Wt 193.8 lb

## 2023-05-26 DIAGNOSIS — Z0000 Encounter for general adult medical examination without abnormal findings: Secondary | ICD-10-CM

## 2023-05-26 DIAGNOSIS — L2084 Intrinsic (allergic) eczema: Secondary | ICD-10-CM

## 2023-05-26 DIAGNOSIS — Z124 Encounter for screening for malignant neoplasm of cervix: Secondary | ICD-10-CM | POA: Insufficient documentation

## 2023-05-26 DIAGNOSIS — G43009 Migraine without aura, not intractable, without status migrainosus: Secondary | ICD-10-CM | POA: Diagnosis not present

## 2023-05-26 DIAGNOSIS — Z975 Presence of (intrauterine) contraceptive device: Secondary | ICD-10-CM | POA: Diagnosis not present

## 2023-05-26 DIAGNOSIS — Z13220 Encounter for screening for lipoid disorders: Secondary | ICD-10-CM

## 2023-05-26 DIAGNOSIS — I493 Ventricular premature depolarization: Secondary | ICD-10-CM

## 2023-05-26 DIAGNOSIS — Z1231 Encounter for screening mammogram for malignant neoplasm of breast: Secondary | ICD-10-CM

## 2023-05-26 DIAGNOSIS — Z136 Encounter for screening for cardiovascular disorders: Secondary | ICD-10-CM

## 2023-05-26 NOTE — Assessment & Plan Note (Signed)
Chronic, stable with no recent migraines.  IUD in place. Monitor closely.

## 2023-05-26 NOTE — Assessment & Plan Note (Signed)
Ongoing -- placed 12/28/2017.  Will remove in next 3 years.

## 2023-05-26 NOTE — Assessment & Plan Note (Signed)
Ongoing.  Followed by cardiology.  Did not tolerate beta blocker therapy.  Continue to monitor and continue collaboration with cardiology.  Lipid panel and TSH today.  May benefit from a PRN Propranolol if present often in future.

## 2023-05-26 NOTE — Progress Notes (Signed)
BP 118/75   Pulse 89   Temp 98 F (36.7 C) (Oral)   Ht 5' 8.5" (1.74 m)   Wt 193 lb 12.8 oz (87.9 kg)   SpO2 95%   BMI 29.04 kg/m    Subjective:    Patient ID: Elizabeth Case, female    DOB: Aug 08, 1971, 52 y.o.   MRN: [id number]  HPI: Elizabeth Case is a 52 y.o. female presenting on 05/26/2023 for comprehensive medical examination. Current medical complaints include:none  She currently lives with: husband Menopausal Symptoms: no  PALPITATIONS Last saw cardiology 02/02/22 -- has yearly appointment coming up.  Was on BB, but this caused hypotension.  Echo on 08/18/22 noted EF 55-60%.  No recent migraines.  Her eczema has been well controlled. Aspirin: no Recurrent headaches: no Visual changes: no Palpitations: yes Dyspnea: no Chest pain: no Lower extremity edema: no Dizzy/lightheaded: no   Depression Screen done today and results listed below:     05/26/2023    8:08 AM 02/10/2023   11:14 AM 01/25/2023   10:41 AM 11/23/2022    1:15 PM 07/15/2022    2:26 PM  Depression screen PHQ 2/9  Decreased Interest 1 0 0 1 0  Down, Depressed, Hopeless 1 0 0 1 0  PHQ - 2 Score 2 0 0 2 0  Altered sleeping 2 0 0 2 0  Tired, decreased energy 1 1 0 1 0  Change in appetite 1 1 0 1 0  Feeling bad or failure about yourself  0 0 0 0 0  Trouble concentrating 1 0 0 0 0  Moving slowly or fidgety/restless 0 0 0 0 0  Suicidal thoughts 0 0 0 0 0  PHQ-9 Score 7 2 0 6 0  Difficult doing work/chores Not difficult at all Somewhat difficult Not difficult at all Not difficult at all Not difficult at all      05/26/2023    8:08 AM 02/10/2023   11:15 AM 01/25/2023   10:41 AM 11/23/2022    1:15 PM  GAD 7 : Generalized Anxiety Score  Nervous, Anxious, on Edge 0 0 0 1  Control/stop worrying 0 0 0 0  Worry too much - different things 0 0 0 0  Trouble relaxing 0 0 0 0  Restless 0 0 0 0  Easily annoyed or irritable 1 0 0 1  Afraid - awful might happen 0 0 0 1  Total GAD 7 Score 1 0 0 3  Anxiety  Difficulty Not difficult at all Not difficult at all Not difficult at all Not difficult at all      08/03/2022    1:14 PM 09/16/2022    1:25 PM 11/23/2022    1:15 PM 01/25/2023   10:41 AM 05/26/2023    8:07 AM  Fall Risk  Falls in the past year? 0 0 0 0 0  Was there an injury with Fall?   0 0 0  Fall Risk Category Calculator   0 0 0  Patient at Risk for Falls Due to   No Fall Risks No Fall Risks No Fall Risks  Fall risk Follow up   Falls evaluation completed Falls evaluation completed Falls evaluation completed    Functional Status Survey: Is the patient deaf or have difficulty hearing?: No Does the patient have difficulty seeing, even when wearing glasses/contacts?: No Does the patient have difficulty concentrating, remembering, or making decisions?: No Does the patient have difficulty walking or climbing stairs?: No Does the patient have  difficulty dressing or bathing?: No Does the patient have difficulty doing errands alone such as visiting a doctor's office or shopping?: No   Past Medical History:  Past Medical History:  Diagnosis Date   Allergic rhinitis    Eczema    GERD (gastroesophageal reflux disease)    Migraines    1x/mo   PONV (postoperative nausea and vomiting)    Vitamin D deficiency disease     Surgical History:  Past Surgical History:  Procedure Laterality Date   BOTOX INJECTION N/A 08/06/2022   Procedure: BOTOX INJECTION;  Surgeon: Campbell Lerner, MD;  Location: [facility];  Service: General;  Laterality: N/A;   BREAST BIOPSY     BREAST EXCISIONAL BIOPSY Right    BREAST REDUCTION SURGERY  1994   BREAST SURGERY Right 2012   lumpectomy   CHOLECYSTECTOMY  1998   COLONOSCOPY WITH PROPOFOL N/A 09/08/2016   Procedure: COLONOSCOPY WITH PROPOFOL;  Surgeon: Wyline Mood, MD;  Location[facility];  Service: Endoscopy;  Laterality: N/A;  Latex sensitivity   RECTAL EXAM UNDER ANESTHESIA N/A 08/06/2022   Procedure: RECTAL EXAM UNDER ANESTHESIA;  Surgeon:  Campbell Lerner, MD;  Location: [facility];  Service: General;  Laterality: N/A;   REDUCTION MAMMAPLASTY Bilateral 1994   SPHINCTEROTOMY N/A 08/06/2022   Procedure: Dennison Mascot, chemical;  Surgeon: Campbell Lerner, MD;  Location: [facility];  Service: General;  Laterality: N/A;   WISDOM TOOTH EXTRACTION      Medications:  Current Outpatient Medications on File Prior to Visit  Medication Sig   cyclobenzaprine (FLEXERIL) 10 MG tablet Take 1 tablet (10 mg total) by mouth at bedtime.   ibuprofen (ADVIL) 200 MG tablet Take 200 mg by mouth every 6 (six) hours as needed. 4 tablets   levonorgestrel (MIRENA) 20 MCG/DAY IUD 1 each by Intrauterine route once for 1 dose.   triamcinolone (KENALOG) 0.025 % cream Apply 1 application topically 2 (two) times daily.   No current facility-administered medications on file prior to visit.    Allergies:  Allergies  Allergen Reactions   Kiwi Extract Shortness Of Breath   Latex Dermatitis   Other Swelling    Walnuts - angioedema    Social History:  Social History   Socioeconomic History   Marital status: Married    Spouse name: Vincenza Hews   Number of children: 2   Years of education: 16   Highest education level: Bachelor's degree (e.g., BA, AB, BS)  Occupational History   Not on file  Tobacco Use   Smoking status: Never    Passive exposure: Never   Smokeless tobacco: Never  Vaping Use   Vaping status: Never Used  Substance and Sexual Activity   Alcohol use: Yes    Alcohol/week: 0.0 standard drinks of alcohol    Comment: holidays   Drug use: No   Sexual activity: Yes    Birth control/protection: I.U.D.    Comment: Mirena  Other Topics Concern   Not on file  Social History Narrative   Not on file   Social Determinants of Health   Financial Resource Strain: Low Risk  (02/10/2023)   Overall Financial Resource Strain (CARDIA)    Difficulty of Paying Living Expenses: Not very hard  Food Insecurity: No Food Insecurity (02/10/2023)   Hunger  Vital Sign    Worried About Running Out of Food in the Last Year: Never true    Ran Out of Food in the Last Year: Never true  Transportation Needs: No Transportation Needs (02/10/2023)   PRAPARE -  Administrator, Civil Service (Medical): No    Lack of Transportation (Non-Medical): No  Physical Activity: Sufficiently Active (02/10/2023)   Exercise Vital Sign    Days of Exercise per Week: 5 days    Minutes of Exercise per Session: 50 min  Stress: No Stress Concern Present (02/10/2023)   [employer] of Occupational Health - Occupational Stress Questionnaire    Feeling of Stress : Not at all  Social Connections: Socially Integrated (02/10/2023)   Social Connection and Isolation Panel [NHANES]    Frequency of Communication with Friends and Family: More than three times a week    Frequency of Social Gatherings with Friends and Family: Once a week    Attends Religious Services: More than 4 times per year    Active Member of [employer] or Organizations: Yes    Attends Engineer, structural: More than 4 times per year    Marital Status: Married  Catering manager Violence: Not At Risk (05/01/2019)   Humiliation, Afraid, Rape, and Kick questionnaire    Fear of Current or Ex-Partner: No    Emotionally Abused: No    Physically Abused: No    Sexually Abused: No   Social History   Tobacco Use  Smoking Status Never   Passive exposure: Never  Smokeless Tobacco Never   Social History   Substance and Sexual Activity  Alcohol Use Yes   Alcohol/week: 0.0 standard drinks of alcohol   Comment: holidays    Family History:  Family History  Problem Relation Age of Onset   Hypertension Mother    Pulmonary fibrosis Mother    Hypertension Father    Cancer Father 76       prostate    Stroke Maternal Grandmother    Cancer Paternal Grandmother        breast   Breast cancer Paternal Grandmother    Stroke Paternal Grandfather        cerebral aneurysm caused the stroke    Aneurysm Paternal Grandfather    Diabetes Neg Hx    Heart disease Neg Hx     Past medical history, surgical history, medications, allergies, family history and social history reviewed with patient today and changes made to appropriate areas of the chart.   ROS All other ROS negative except what is listed above and in the HPI.      Objective:    BP 118/75   Pulse 89   Temp 98 F (36.7 C) (Oral)   Ht 5' 8.5" (1.74 m)   Wt 193 lb 12.8 oz (87.9 kg)   SpO2 95%   BMI 29.04 kg/m   Wt Readings from Last 3 Encounters:  05/26/23 193 lb 12.8 oz (87.9 kg)  02/10/23 186 lb 6.4 oz (84.6 kg)  02/08/23 190 lb 4.1 oz (86.3 kg)    Physical Exam Vitals and nursing note reviewed. Exam conducted with a chaperone present.  Constitutional:      General: She is awake. She is not in acute distress.    Appearance: She is well-developed and well-groomed. She is not ill-appearing or toxic-appearing.  HENT:     Head: Normocephalic and atraumatic.     Right Ear: Hearing, tympanic membrane, ear canal and external ear normal. No drainage.     Left Ear: Hearing, tympanic membrane, ear canal and external ear normal. No drainage.     Nose: Nose normal.     Right Sinus: No maxillary sinus tenderness or frontal sinus tenderness.     Left  Sinus: No maxillary sinus tenderness or frontal sinus tenderness.     Mouth/Throat:     Mouth: Mucous membranes are moist.     Pharynx: Oropharynx is clear. Uvula midline. No pharyngeal swelling, oropharyngeal exudate or posterior oropharyngeal erythema.  Eyes:     General: Lids are normal.        Right eye: No discharge.        Left eye: No discharge.     Extraocular Movements: Extraocular movements intact.     Conjunctiva/sclera: Conjunctivae normal.     Pupils: Pupils are equal, round, and reactive to light.     Visual Fields: Right eye visual fields normal and left eye visual fields normal.  Neck:     Thyroid: No thyromegaly.     Vascular: No carotid bruit.      Trachea: Trachea normal.  Cardiovascular:     Rate and Rhythm: Normal rate and regular rhythm.     Heart sounds: Normal heart sounds. No murmur heard.    No gallop.  Pulmonary:     Effort: Pulmonary effort is normal. No accessory muscle usage or respiratory distress.     Breath sounds: Normal breath sounds.  Chest:  Breasts:    Right: Normal.     Left: Normal.  Abdominal:     General: Bowel sounds are normal.     Palpations: Abdomen is soft. There is no hepatomegaly or splenomegaly.     Tenderness: There is no abdominal tenderness.     Hernia: There is no hernia in the left inguinal area or right inguinal area.  Genitourinary:    Exam position: Lithotomy position.     Pubic Area: No rash.      Labia:        Right: No rash.        Left: No rash.      Vagina: Normal.     Cervix: Normal.     Uterus: Normal.      Adnexa: Right adnexa normal.     Rectum: External hemorrhoid present.     Comments: Cervix anterior and viewed.  IUD strings viewed.  Pap obtained. Musculoskeletal:        General: Normal range of motion.     Cervical back: Normal range of motion and neck supple.     Right lower leg: No edema.     Left lower leg: No edema.  Lymphadenopathy:     Head:     Right side of head: No submental, submandibular, tonsillar, preauricular or posterior auricular adenopathy.     Left side of head: No submental, submandibular, tonsillar, preauricular or posterior auricular adenopathy.     Cervical: No cervical adenopathy.     Upper Body:     Right upper body: No supraclavicular, axillary or pectoral adenopathy.     Left upper body: No supraclavicular, axillary or pectoral adenopathy.  Skin:    General: Skin is warm and dry.     Capillary Refill: Capillary refill takes less than 2 seconds.     Findings: No rash.  Neurological:     Mental Status: She is alert and oriented to person, place, and time.     Gait: Gait is intact.     Deep Tendon Reflexes: Reflexes are normal and  symmetric.     Reflex Scores:      Brachioradialis reflexes are 2+ on the right side and 2+ on the left side.      Patellar reflexes are 2+ on the right side and  2+ on the left side. Psychiatric:        Attention and Perception: Attention normal.        Mood and Affect: Mood normal.        Speech: Speech normal.        Behavior: Behavior normal. Behavior is cooperative.        Thought Content: Thought content normal.        Judgment: Judgment normal.    Results for orders placed or performed in visit on 02/10/23  Fecal leukocytes   Specimen: Stool   ST     CD- [id number] V  Result Value Ref Range   White Blood Cells (WBC), Stool Final report (A) None Seen   Result 1 Comment (A)   Stool Culture   Specimen: Stool   ST     CD- [id number] V  Result Value Ref Range   Salmonella/Shigella Screen Final report    Stool Culture result 1 ([facility]) Comment    Campylobacter Culture Final report    Stool Culture result 1 ([facility]) Comment    E coli, Shiga toxin Assay Negative Negative  Clostridium difficile EIA   ST     CD- [id number] V  Result Value Ref Range   C difficile Toxins A+B, EIA Positive (A) Negative  Ova and parasite examination   ST     CD- [id number] V  Result Value Ref Range   OVA + PARASITE EXAM Final report    O&P result 1 Comment   Stool C-Diff Toxin Assay   Specimen: Stool   ST  Result Value Ref Range   C difficile Toxins A+B, EIA Negative Negative  Comprehensive metabolic panel  Result Value Ref Range   Glucose 100 (H) 70 - 99 mg/dL   BUN 5 (L) 6 - 24 mg/dL   Creatinine, Ser 5.36 0.57 - 1.00 mg/dL   eGFR 644 >03 KV/QQV/9.56   BUN/Creatinine Ratio 8 (L) 9 - 23   Sodium 139 134 - 144 mmol/L   Potassium 3.9 3.5 - 5.2 mmol/L   Chloride 102 96 - 106 mmol/L   CO2 23 20 - 29 mmol/L   Calcium 9.1 8.7 - 10.2 mg/dL   Total Protein 6.5 6.0 - 8.5 g/dL   Albumin 4.2 3.8 - 4.9 g/dL   Globulin, Total 2.3 1.5 - 4.5 g/dL   Albumin/Globulin Ratio 1.8 1.2 - 2.2   Bilirubin Total  0.2 0.0 - 1.2 mg/dL   Alkaline Phosphatase 75 44 - 121 IU/L   AST 15 0 - 40 IU/L   ALT 11 0 - 32 IU/L  CBC With Differential/Platelet  Result Value Ref Range   WBC 5.2 3.4 - 10.8 x10E3/uL   RBC 4.81 3.77 - 5.28 x10E6/uL   Hemoglobin 14.7 11.1 - 15.9 g/dL   Hematocrit 38.7 56.4 - 46.6 %   MCV 87 79 - 97 fL   MCH 30.6 26.6 - 33.0 pg   MCHC 35.1 31.5 - 35.7 g/dL   RDW 33.2 95.1 - 88.4 %   Platelets 222 150 - 450 x10E3/uL   Neutrophils 63 Not Estab. %   Lymphs 25 Not Estab. %   MID 12 Not Estab. %   Neutrophils Absolute 3.3 1.4 - 7.0 x10E3/uL   Lymphocytes Absolute 1.3 0.7 - 3.1 x10E3/uL   MID (Absolute) 0.6 0.1 - 1.6 X10E3/uL      Assessment & Plan:   Problem List Items Addressed This Visit       Cardiovascular and Mediastinum   Frequent unifocal PVCs  Ongoing.  Followed by cardiology.  Did not tolerate beta blocker therapy.  Continue to monitor and continue collaboration with cardiology.  Lipid panel and TSH today.  May benefit from a PRN Propranolol if present often in future.      Relevant Orders   CBC with Differential/Platelet   Comprehensive metabolic panel   Lipid Panel w/o Chol/HDL Ratio   Migraines - Primary    Chronic, stable with no recent migraines.  IUD in place. Monitor closely.      Relevant Orders   TSH     Musculoskeletal and Integument   Eczema    Chronic, stable.  Will continue current medication regimen which is offering benefit.  If ongoing discussed possible need for referral to dermatology and use of monoclonal antibody treatment.      Relevant Orders   CBC with Differential/Platelet   TSH     Other   IUD (intrauterine device) in place    Ongoing -- placed 12/28/2017.  Will remove in next 3 years.      Other Visit Diagnoses     Encounter for lipid screening for cardiovascular disease       Lipid panel on labs today.   Relevant Orders   Comprehensive metabolic panel   Lipid Panel w/o Chol/HDL Ratio   Encounter for screening  mammogram for malignant neoplasm of breast       Placed order for mammogram and instructed on how to schedule.   Relevant Orders   MM 3D SCREENING MAMMOGRAM BILATERAL BREAST   Cervical cancer screening       Pap testing performed today.   Relevant Orders   Cytology - PAP   Encounter for annual physical exam       Annual physical today with labs and health maintenance reviewed, discussed with patient.        Follow up plan: Return in about 1 year (around 05/25/2024) for Annual Exam.   LABORATORY TESTING:  - Pap smear: pap done  IMMUNIZATIONS:   - Tdap: Tetanus vaccination status reviewed: last tetanus booster within 10 years. - Influenza: Up to date - Pneumovax: Not applicable - Prevnar: Not applicable - COVID: Up to date - HPV: Not applicable - Shingrix vaccine: continues to think about this  SCREENING: -Mammogram: Up to date -- due next 07/22/23 - Colonoscopy: Up to date -- due next 09/08/2026 - Bone Density: Not applicable  -Hearing Test: Not applicable  -Spirometry: Not applicable   PATIENT COUNSELING:   Advised to take 1 mg of folate supplement per day if capable of pregnancy.   Sexuality: Discussed sexually transmitted diseases, partner selection, use of condoms, avoidance of unintended pregnancy  and contraceptive alternatives.   Advised to avoid cigarette smoking.  I discussed with the patient that most people either abstain from alcohol or drink within safe limits (<=14/week and <=4 drinks/occasion for males, <=7/weeks and <= 3 drinks/occasion for females) and that the risk for alcohol disorders and other health effects rises proportionally with the number of drinks per week and how often a drinker exceeds daily limits.  Discussed cessation/primary prevention of drug use and availability of treatment for abuse.   Diet: Encouraged to adjust caloric intake to maintain  or achieve ideal body weight, to reduce intake of dietary saturated fat and total fat, to limit  sodium intake by avoiding high sodium foods and not adding table salt, and to maintain adequate dietary potassium and calcium preferably from fresh fruits, vegetables, and low-fat dairy products.    Stressed  the importance of regular exercise  Injury prevention: Discussed safety belts, safety helmets, smoke detector, smoking near bedding or upholstery.   Dental health: Discussed importance of regular tooth brushing, flossing, and dental visits.    NEXT PREVENTATIVE PHYSICAL DUE IN 1 YEAR. Return in about 1 year (around 05/25/2024) for Annual Exam.

## 2023-05-26 NOTE — Assessment & Plan Note (Signed)
Chronic, stable.  Will continue current medication regimen which is offering benefit.  If ongoing discussed possible need for referral to dermatology and use of monoclonal antibody treatment.

## 2023-05-27 ENCOUNTER — Other Ambulatory Visit: Payer: Self-pay | Admitting: Nurse Practitioner

## 2023-05-27 ENCOUNTER — Encounter: Payer: Self-pay | Admitting: Nurse Practitioner

## 2023-05-27 DIAGNOSIS — D582 Other hemoglobinopathies: Secondary | ICD-10-CM

## 2023-05-27 LAB — CBC WITH DIFFERENTIAL/PLATELET: Lymphs: 30 %

## 2023-05-27 NOTE — Progress Notes (Signed)
Contacted via MyChart -- lab only visit in 4 weeks please   Good morning Elizabeth Case, your labs have returned: - CBC shows mild elevation in hemoglobin and hematocrit which is new for you.  Are you taking any supplements?  I would like to recheck this in 4 weeks, come hydrated as this could be related to your fasting and not being hydrated. - Kidney function, creatinine and eGFR, remains normal, as is liver function, AST and ALT.  - TSH, thyroid, normal. - Your LDL is above normal. The LDL is the bad cholesterol. Over time and in combination with inflammation and other factors, this contributes to plaque which in turn may lead to stroke and/or heart attack down the road. Sometimes high LDL is primarily genetic, and people might be eating all the right foods but still have high numbers. Other times, there is room for improvement in one's diet and eating healthier can bring this number down and potentially reduce one's risk of heart attack and/or stroke.   To reduce your LDL, Remember - more fruits and vegetables, more fish, and limit red meat and dairy products. More soy, nuts, beans, barley, lentils, oats and plant sterol ester enriched margarine instead of butter. I also encourage eliminating sugar and processed food. Remember, shop on the outside of the grocery store and visit your [employer]. Any questions? Keep being stellar!!  Thank you for allowing me to participate in your care.  I appreciate you. Kindest regards, Dolton Shaker

## 2023-05-30 NOTE — Progress Notes (Signed)
Contacted via MyChart   Good news!! Pap testing is all negative.  Repeat in 5 years!!

## 2023-05-30 NOTE — Progress Notes (Signed)
Called and scheduled patient on 06/23/2023 @ 9:00 am.

## 2023-06-10 ENCOUNTER — Encounter: Payer: Self-pay | Admitting: Cardiology

## 2023-06-10 ENCOUNTER — Ambulatory Visit: Payer: BC Managed Care – PPO | Attending: Cardiology | Admitting: Cardiology

## 2023-06-10 VITALS — BP 116/80 | HR 84 | Ht 69.0 in | Wt 195.8 lb

## 2023-06-10 DIAGNOSIS — I493 Ventricular premature depolarization: Secondary | ICD-10-CM

## 2023-06-10 NOTE — Progress Notes (Signed)
Cardiology Office Note:    Date:  06/10/2023   ID:  Elizabeth Case, DOB [birth year], MRN [id number]  PCP:  Marjie Skiff, NP  [facility] Cardiologist:  Debbe Odea, MD  [facility] Electrophysiologist:  None   Referring MD: Marjie Skiff, NP   Chief Complaint  Patient presents with   Follow-up    Patient denies new or acute cardiac problems/concerns today.      History of Present Illness:    Elizabeth Case is a 52 y.o. female with a hx of GERD, frequent PVCs who presents for follow-up.    Patient doing okay, being seen for frequent PVCs, recent echocardiogram 08/2022 showed normal systolic function.  She denies chest pain or shortness of breath.  Feels well, no concerns at this time.  Prior notes Echo 08/2022 EF 55 to 60% Echocardiogram 07/2020 normal systolic and diastolic function, EF 55 to 60%. Cardiac monitor: 06/2020 frequent PVCs, 15% PVC burden. Did not tolerate Toprol-XL in the past.   Past Medical History:  Diagnosis Date   Allergic rhinitis    Eczema    GERD (gastroesophageal reflux disease)    Migraines    1x/mo   PONV (postoperative nausea and vomiting)    Vitamin D deficiency disease     Past Surgical History:  Procedure Laterality Date   BOTOX INJECTION N/A 08/06/2022   Procedure: BOTOX INJECTION;  Surgeon: Campbell Lerner, MD;  Location: [facility];  Service: General;  Laterality: N/A;   BREAST BIOPSY     BREAST EXCISIONAL BIOPSY Right    BREAST REDUCTION SURGERY  1994   BREAST SURGERY Right 2012   lumpectomy   CHOLECYSTECTOMY  1998   COLONOSCOPY WITH PROPOFOL N/A 09/08/2016   Procedure: COLONOSCOPY WITH PROPOFOL;  Surgeon: Wyline Mood, MD;  Location: [facility];  Service: Endoscopy;  Laterality: N/A;  Latex sensitivity   RECTAL EXAM UNDER ANESTHESIA N/A 08/06/2022   Procedure: RECTAL EXAM UNDER ANESTHESIA;  Surgeon: Campbell Lerner, MD;  Location: [facility];  Service: General;  Laterality: N/A;   REDUCTION MAMMAPLASTY  Bilateral 1994   SPHINCTEROTOMY N/A 08/06/2022   Procedure: Dennison Mascot, chemical;  Surgeon: Campbell Lerner, MD;  Location: [facility];  Service: General;  Laterality: N/A;   WISDOM TOOTH EXTRACTION      Current Medications: Current Meds  Medication Sig   cyclobenzaprine (FLEXERIL) 10 MG tablet Take 1 tablet (10 mg total) by mouth at bedtime.   ibuprofen (ADVIL) 200 MG tablet Take 200 mg by mouth every 6 (six) hours as needed. 4 tablets   levonorgestrel (MIRENA) 20 MCG/DAY IUD 1 each by Intrauterine route once for 1 dose.   triamcinolone (KENALOG) 0.025 % cream Apply 1 application topically 2 (two) times daily.     Allergies:   Kiwi extract, Latex, and Other   Social History   Socioeconomic History   Marital status: Married    Spouse name: Vincenza Hews   Number of children: 2   Years of education: 16   Highest education level: Bachelor's degree (e.g., BA, AB, BS)  Occupational History   Not on file  Tobacco Use   Smoking status: Never    Passive exposure: Never   Smokeless tobacco: Never  Vaping Use   Vaping status: Never Used  Substance and Sexual Activity   Alcohol use: Yes    Alcohol/week: 0.0 standard drinks of alcohol    Comment: holidays   Drug use: No   Sexual activity: Yes    Birth control/protection: I.U.D.  Comment: Mirena  Other Topics Concern   Not on file  Social History Narrative   Not on file   Social Determinants of Health   Financial Resource Strain: Low Risk  (02/10/2023)   Overall Financial Resource Strain (CARDIA)    Difficulty of Paying Living Expenses: Not very hard  Food Insecurity: No Food Insecurity (02/10/2023)   Hunger Vital Sign    Worried About Running Out of Food in the Last Year: Never true    Ran Out of Food in the Last Year: Never true  Transportation Needs: No Transportation Needs (02/10/2023)   PRAPARE - Administrator, Civil Service (Medical): No    Lack of Transportation (Non-Medical): No  Physical Activity:  Sufficiently Active (02/10/2023)   Exercise Vital Sign    Days of Exercise per Week: 5 days    Minutes of Exercise per Session: 50 min  Stress: No Stress Concern Present (02/10/2023)   [employer] of Occupational Health - Occupational Stress Questionnaire    Feeling of Stress : Not at all  Social Connections: Socially Integrated (02/10/2023)   Social Connection and Isolation Panel [NHANES]    Frequency of Communication with Friends and Family: More than three times a week    Frequency of Social Gatherings with Friends and Family: Once a week    Attends Religious Services: More than 4 times per year    Active Member of [employer] or Organizations: Yes    Attends Engineer, structural: More than 4 times per year    Marital Status: Married     Family History: The patient's family history includes Aneurysm in her paternal grandfather; Breast cancer in her paternal grandmother; Cancer in her paternal grandmother; Cancer (age of onset: 70) in her father; Hypertension in her father and mother; Pulmonary fibrosis in her mother; Stroke in her maternal grandmother and paternal grandfather. There is no history of Diabetes or Heart disease.  ROS:   Please see the history of present illness.     All other systems reviewed and are negative.  EKGs/Labs/Other Studies Reviewed:    The following studies were reviewed today:   EKG:  EKG is  ordered today.  The ekg ordered today demonstrates sinus rhythm, occasional PVCs  Recent Labs: 05/26/2023: ALT 25; BUN 9; Creatinine, Ser 0.64; Hemoglobin 16.6; Platelets 277; Potassium 3.9; Sodium 142; TSH 3.210  Recent Lipid Panel    Component Value Date/Time   CHOL 182 05/26/2023 0839   TRIG 59 05/26/2023 0839   HDL 61 05/26/2023 0839   CHOLHDL 3.4 05/08/2020 1533   VLDL 20 02/08/2017 1418   LDLCALC 110 (H) 05/26/2023 0839   LDLCALC 86 05/08/2020 1533    Physical Exam:    VS:  BP 116/80 (BP Location: Left Arm, Patient Position: Sitting, Cuff  Size: Normal)   Pulse 84   Ht 5\' 9"  (1.753 m)   Wt 195 lb 12.8 oz (88.8 kg)   SpO2 98%   BMI 28.91 kg/m     Wt Readings from Last 3 Encounters:  06/10/23 195 lb 12.8 oz (88.8 kg)  05/26/23 193 lb 12.8 oz (87.9 kg)  02/10/23 186 lb 6.4 oz (84.6 kg)     GEN:  Well nourished, well developed in no acute distress HEENT: Normal NECK: No JVD; No carotid bruits CARDIAC: RRR, occasional skipped beats, no murmurs, rubs, gallops RESPIRATORY:  Clear to auscultation without rales, wheezing or rhonchi  ABDOMEN: Soft, non-tender, non-distended MUSCULOSKELETAL:  No edema; No deformity  SKIN: Warm and  dry NEUROLOGIC:  Alert and oriented x 3 PSYCHIATRIC:  Normal affect   ASSESSMENT:    1. Frequent unifocal PVCs    PLAN:    In order of problems listed above:  History of frequent PVCs, LBBB pattern, 15% burden.  Echo 10/23 EF 55 to 60%.  Prior echo in 2021 was also normal.  Patient did not tolerate beta-blocker/Toprol-XL in the past.  Currently asymptomatic.  Continue to monitor EF chronically, increase duration of monitoring to every 3 to 5 years, or earlier if patient develops symptoms..   Follow-up in 1-2 years  This note was generated in part or whole with voice recognition software. Voice recognition is usually quite accurate but there are transcription errors that can and very often do occur. I apologize for any typographical errors that were not detected and corrected.  Medication Adjustments/Labs and Tests Ordered: Current medicines are reviewed at length with the patient today.  Concerns regarding medicines are outlined above.  Orders Placed This Encounter  Procedures   EKG 12-Lead   No orders of the defined types were placed in this encounter.   Patient Instructions  Medication Instructions:   Your physician recommends that you continue on your current medications as directed. Please refer to the Current Medication list given to you today.  *If you need a refill on your  cardiac medications before your next appointment, please call your pharmacy*   Lab Work:  None Ordered  If you have labs (blood work) drawn today and your tests are completely normal, you will receive your results only by: MyChart Message (if you have MyChart) OR A paper copy in the mail If you have any lab test that is abnormal or we need to change your treatment, we will call you to review the results.   Testing/Procedures:  None Ordered    Follow-Up: At [facility], you and your health needs are our priority.  As part of our continuing mission to provide you with exceptional heart care, we have created designated Provider Care Teams.  These Care Teams include your primary Cardiologist (physician) and Advanced Practice Providers (APPs -  Physician Assistants and Nurse Practitioners) who all work together to provide you with the care you need, when you need it.  We recommend signing up for the patient portal called "MyChart".  Sign up information is provided on this After Visit Summary.  MyChart is used to connect with patients for Virtual Visits (Telemedicine).  Patients are able to view lab/test results, encounter notes, upcoming appointments, etc.  Non-urgent messages can be sent to your provider as well.   To learn more about what you can do with MyChart, go to [email or web address].    Your next appointment:   18  month(s)  Provider:   You may see Debbe Odea, MD or one of the following Advanced Practice Providers on your designated Care Team:   Nicolasa Ducking, NP Eula Listen, PA-C Cadence Fransico Michael, PA-C Charlsie Quest, NP   Signed, Debbe Odea, MD  06/10/2023 3:53 PM    [facility]

## 2023-06-10 NOTE — Patient Instructions (Signed)
Medication Instructions:   Your physician recommends that you continue on your current medications as directed. Please refer to the Current Medication list given to you today.  *If you need a refill on your cardiac medications before your next appointment, please call your pharmacy*   Lab Work:  None Ordered  If you have labs (blood work) drawn today and your tests are completely normal, you will receive your results only by: MyChart Message (if you have MyChart) OR A paper copy in the mail If you have any lab test that is abnormal or we need to change your treatment, we will call you to review the results.   Testing/Procedures:  None Ordered    Follow-Up: At [facility], you and your health needs are our priority.  As part of our continuing mission to provide you with exceptional heart care, we have created designated Provider Care Teams.  These Care Teams include your primary Cardiologist (physician) and Advanced Practice Providers (APPs -  Physician Assistants and Nurse Practitioners) who all work together to provide you with the care you need, when you need it.  We recommend signing up for the patient portal called "MyChart".  Sign up information is provided on this After Visit Summary.  MyChart is used to connect with patients for Virtual Visits (Telemedicine).  Patients are able to view lab/test results, encounter notes, upcoming appointments, etc.  Non-urgent messages can be sent to your provider as well.   To learn more about what you can do with MyChart, go to [email or web address].    Your next appointment:   18  month(s)  Provider:   You may see Debbe Odea, MD or one of the following Advanced Practice Providers on your designated Care Team:   Nicolasa Ducking, NP Eula Listen, PA-C Cadence Fransico Michael, PA-C Charlsie Quest, NP

## 2023-06-20 IMAGING — MG MM DIGITAL SCREENING BILAT W/ TOMO AND CAD
8 series · 8 of 24 positions shown · non-contrast
Comparison: Previous exam(s).

CLINICAL DATA: Screening.

EXAM:
DIGITAL SCREENING BILATERAL MAMMOGRAM WITH TOMOSYNTHESIS AND CAD
TECHNIQUE: Bilateral screening digital craniocaudal and mediolateral oblique
mammograms were obtained. Bilateral screening digital breast
tomosynthesis was performed. The images were evaluated with
computer-aided detection.

[L MLO synth-2D]
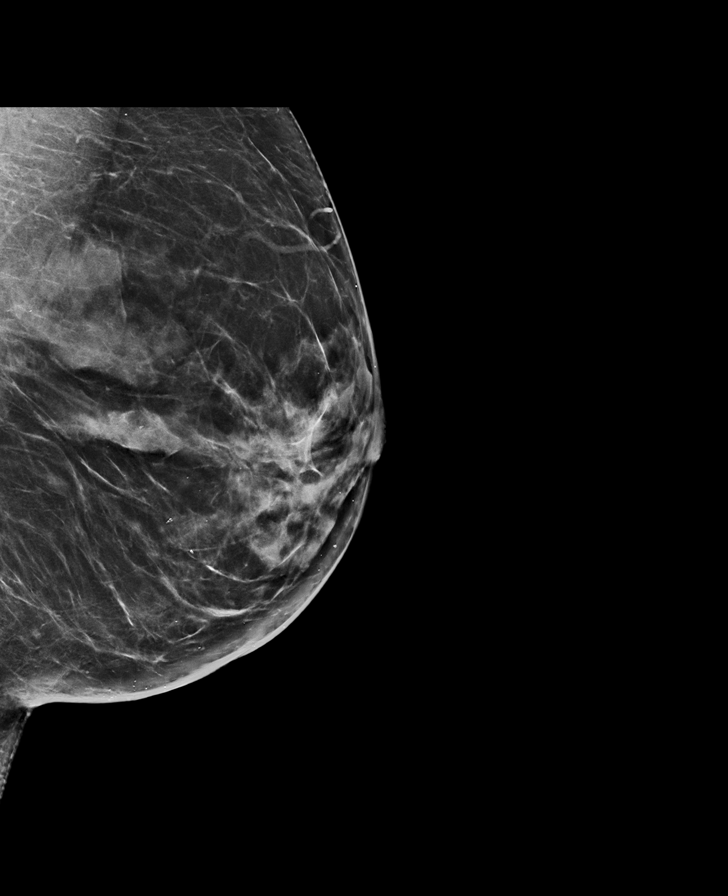

[L CC synth-2D]
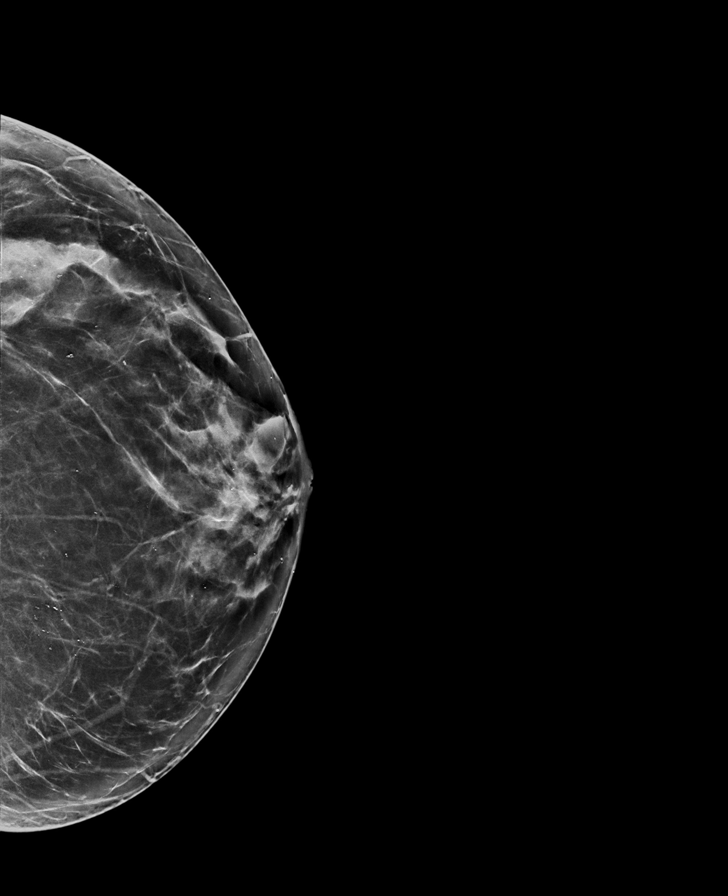

[R CC synth-2D]
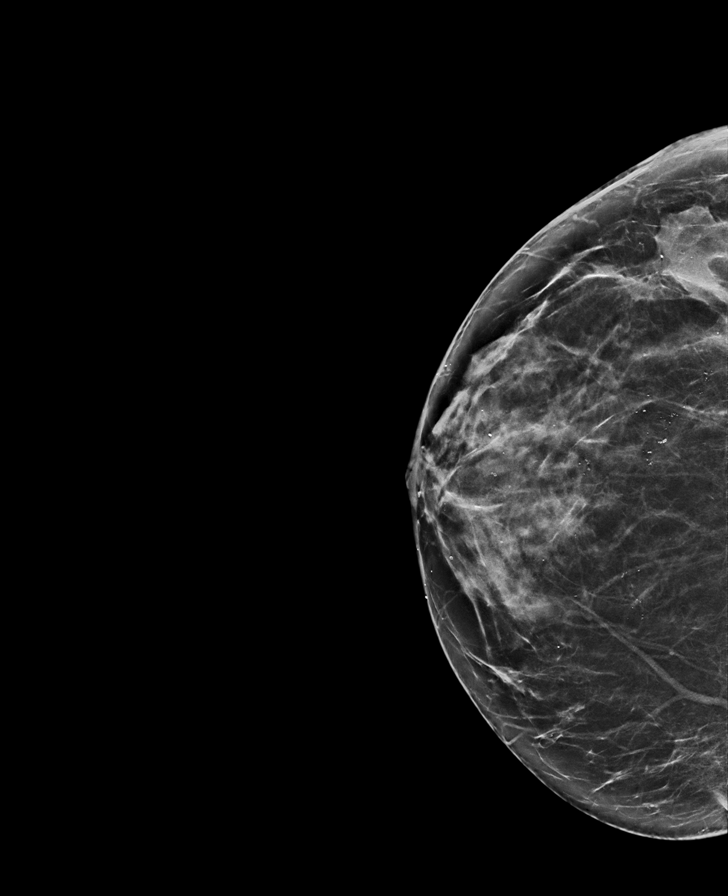

[R MLO synth-2D]
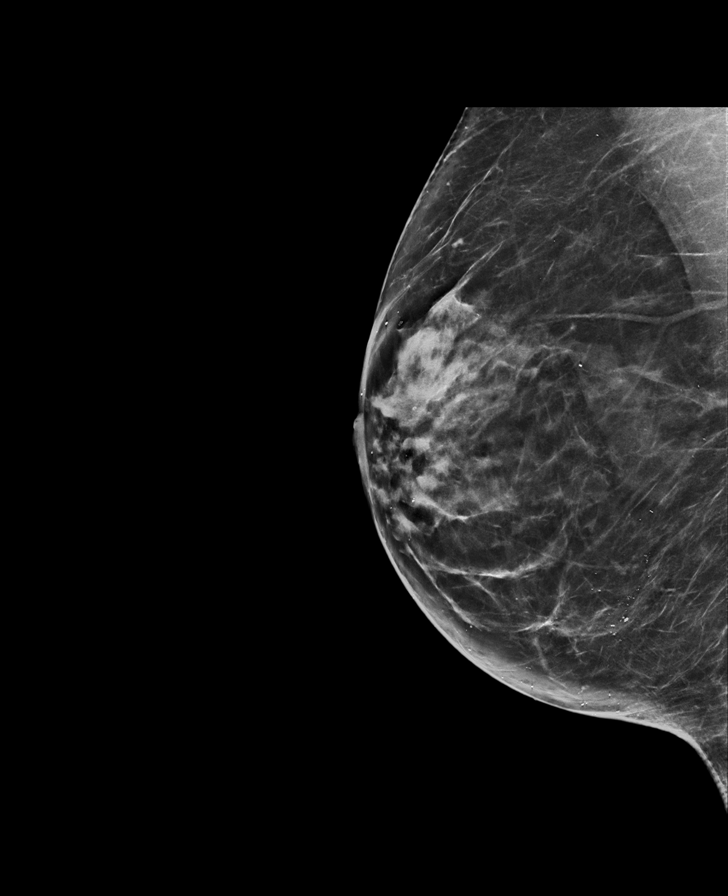

[L CC tomo · tomo slice 38/75.0]
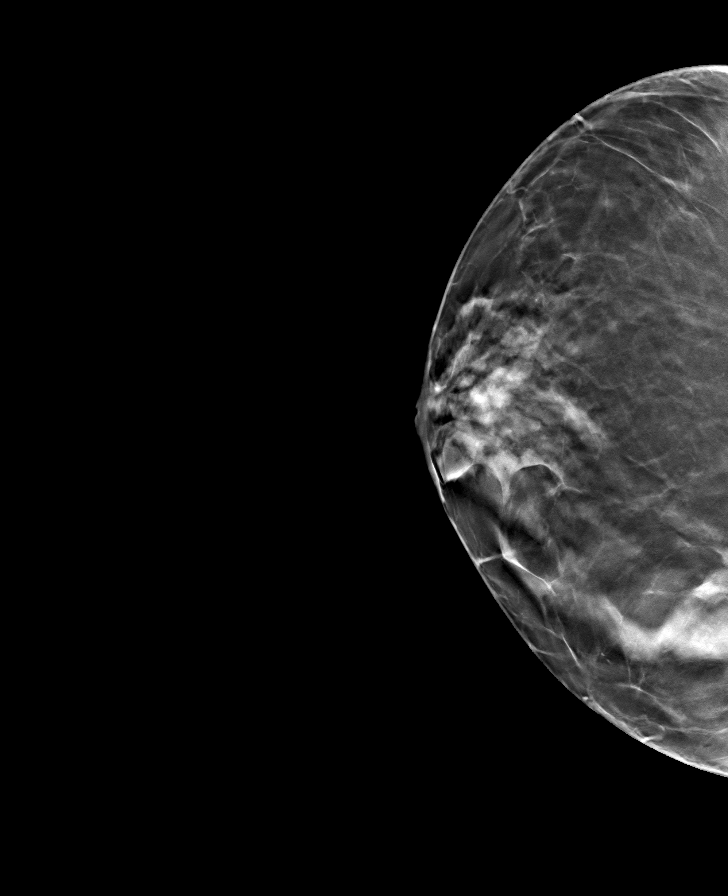

[L MLO tomo · tomo slice 40/79.0]
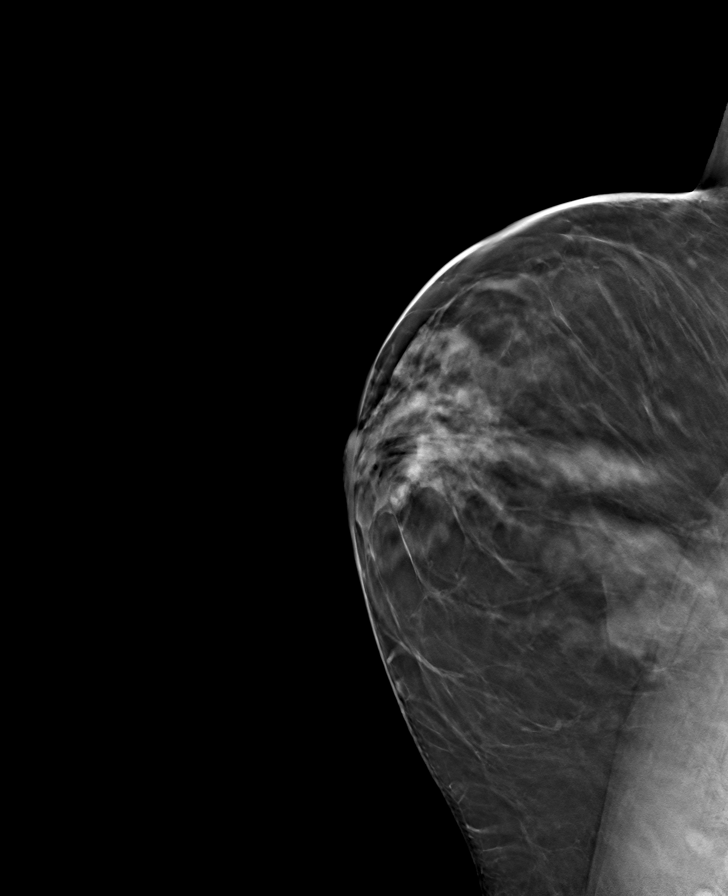

[R MLO tomo · tomo slice 39/77.0]
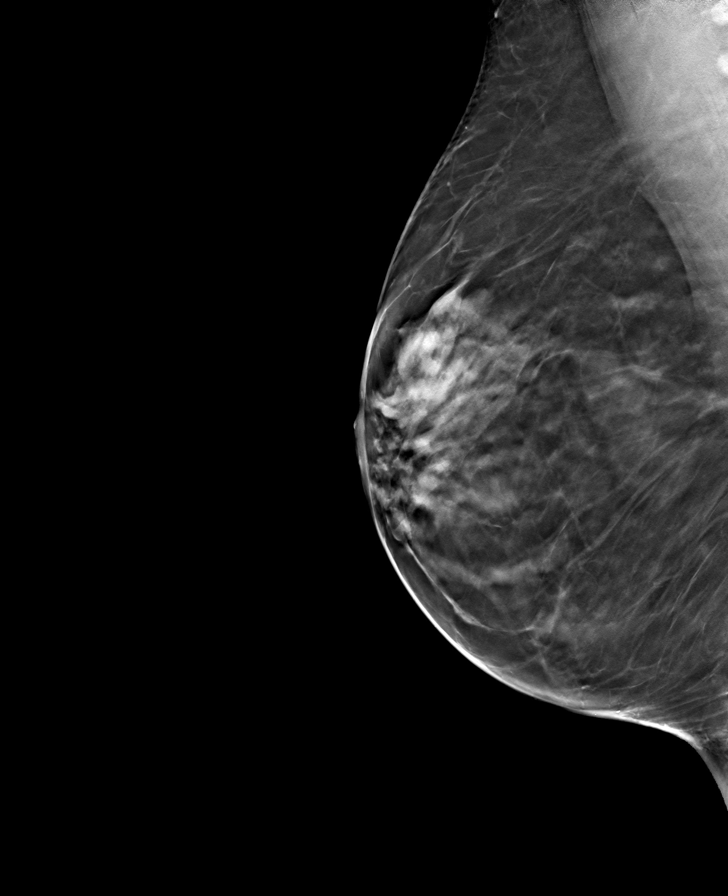

[R CC tomo · tomo slice 35/70.0]
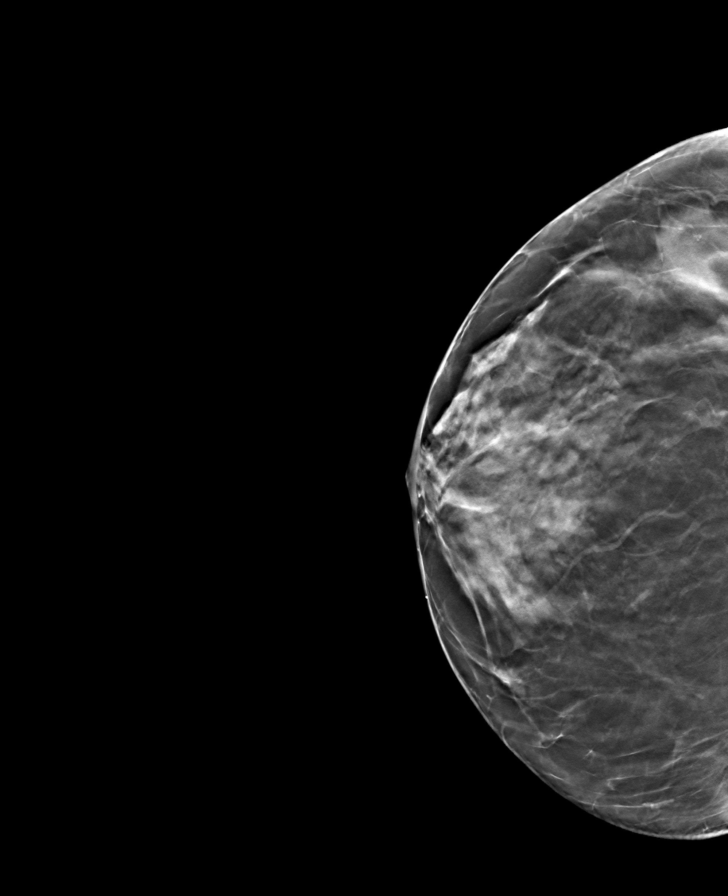

[8 of 24 positions shown; findings below may reference images not displayed]

ACR Breast Density Category b: There are scattered areas of
fibroglandular density.
FINDINGS: There are no findings suspicious for malignancy.
IMPRESSION: No mammographic evidence of malignancy. A result letter of this
screening mammogram will be mailed directly to the patient.

RECOMMENDATION:
Screening mammogram in one year. (Code:51-O-LD2)

BI-RADS CATEGORY  1: Negative.

## 2023-06-23 ENCOUNTER — Other Ambulatory Visit: Payer: BC Managed Care – PPO

## 2023-06-23 DIAGNOSIS — D582 Other hemoglobinopathies: Secondary | ICD-10-CM

## 2023-06-24 LAB — CBC WITH DIFFERENTIAL/PLATELET
Basophils Absolute: 0.1 10*3/uL (ref 0.0–0.2)
Basos: 1 %
EOS (ABSOLUTE): 0.1 10*3/uL (ref 0.0–0.4)
Eos: 2 %
Hematocrit: 45.3 % (ref 34.0–46.6)
Hemoglobin: 15.1 g/dL (ref 11.1–15.9)
Immature Grans (Abs): 0 10*3/uL (ref 0.0–0.1)
Immature Granulocytes: 0 %
Lymphocytes Absolute: 1.4 10*3/uL (ref 0.7–3.1)
Lymphs: 31 %
MCH: 29.8 pg (ref 26.6–33.0)
MCHC: 33.3 g/dL (ref 31.5–35.7)
MCV: 90 fL (ref 79–97)
Monocytes Absolute: 0.4 10*3/uL (ref 0.1–0.9)
Monocytes: 10 %
Neutrophils Absolute: 2.5 10*3/uL (ref 1.4–7.0)
Neutrophils: 56 %
Platelets: 246 10*3/uL (ref 150–450)
RBC: 5.06 x10E6/uL (ref 3.77–5.28)
RDW: 12.7 % (ref 11.7–15.4)
WBC: 4.4 10*3/uL (ref 3.4–10.8)

## 2023-06-24 NOTE — Progress Notes (Signed)
Contacted via MyChart   Good news this morning Elizabeth Case, your labs have returned and are nice and normal!!  Woohoo!!

## 2023-08-10 ENCOUNTER — Encounter: Payer: Self-pay | Admitting: Nurse Practitioner

## 2023-09-13 ENCOUNTER — Encounter: Payer: Self-pay | Admitting: Obstetrics and Gynecology

## 2023-09-13 DIAGNOSIS — Z30431 Encounter for routine checking of intrauterine contraceptive device: Secondary | ICD-10-CM

## 2023-09-13 DIAGNOSIS — N939 Abnormal uterine and vaginal bleeding, unspecified: Secondary | ICD-10-CM

## 2023-09-13 NOTE — Telephone Encounter (Signed)
Pls call to schedule. Thx.

## 2023-10-03 ENCOUNTER — Ambulatory Visit: Payer: BC Managed Care – PPO

## 2023-10-03 DIAGNOSIS — N939 Abnormal uterine and vaginal bleeding, unspecified: Secondary | ICD-10-CM

## 2023-10-03 DIAGNOSIS — Z30431 Encounter for routine checking of intrauterine contraceptive device: Secondary | ICD-10-CM

## 2023-10-11 NOTE — Progress Notes (Unsigned)
Marjie Skiff, NP   No chief complaint on file.   HPI:      [name] is a 52 y.o. [id number] whose LMP was No LMP recorded. (Menstrual status: IUD)., presents today for ***   GYN u/s 01/14/22 showed malpositioned IUD so replaced 01/18/22. Amenorrheic from 10/23-10/24 then started BTB, now with daily cramping and bleeding; GYN u/s 12/24 was normal except IUD malpositioned again.  Neg pap 7/24 with PCP  ? Sexually active?? 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   First degree hemorrhoids    Allergic rhinitis    Migraines    Eczema     Past Surgical History:  Procedure Laterality Date   BOTOX INJECTION N/A 08/06/2022   Procedure: BOTOX INJECTION;  Surgeon: Campbell Lerner, MD;  Location: [facility];  Service: General;  Laterality: N/A;   BREAST BIOPSY     BREAST EXCISIONAL BIOPSY Right    BREAST REDUCTION SURGERY  1994   BREAST SURGERY Right 2012   lumpectomy   CHOLECYSTECTOMY  1998   COLONOSCOPY WITH PROPOFOL N/A 09/08/2016   Procedure: COLONOSCOPY WITH PROPOFOL;  Surgeon: Wyline Mood, MD;  Location: [facility];  Service: Endoscopy;  Laterality: N/A;  Latex sensitivity   RECTAL EXAM UNDER ANESTHESIA N/A 08/06/2022   Procedure: RECTAL EXAM UNDER ANESTHESIA;  Surgeon: Campbell Lerner, MD;  Location: [facility];  Service: General;  Laterality: N/A;   REDUCTION MAMMAPLASTY Bilateral 1994   SPHINCTEROTOMY N/A 08/06/2022   Procedure: Dennison Mascot, chemical;  Surgeon: Campbell Lerner, MD;  Location: [facility];  Service: General;  Laterality: N/A;   WISDOM TOOTH EXTRACTION      Family History  Problem Relation Age of  Onset   Hypertension Mother    Pulmonary fibrosis Mother    Hypertension Father    Cancer Father 38       prostate    Stroke Maternal Grandmother    Cancer Paternal Grandmother        breast   Breast cancer Paternal Grandmother    Stroke Paternal Grandfather        cerebral aneurysm caused the stroke   Aneurysm Paternal Grandfather    Diabetes Neg Hx    Heart disease Neg Hx     Social History   Socioeconomic History   Marital status: Married    Spouse name: Vincenza Hews   Number of children: 2   Years of education: 16   Highest education level: Bachelor's degree (e.g., BA, AB, BS)  Occupational History   Not on file  Tobacco Use   Smoking status: Never    Passive exposure: Never   Smokeless tobacco: Never  Vaping Use   Vaping status: Never Used  Substance and Sexual Activity   Alcohol use: Yes    Alcohol/week: 0.0 standard drinks of alcohol    Comment: holidays   Drug use: No   Sexual activity: Yes    Birth control/protection: I.U.D.    Comment: Mirena  Other Topics Concern   Not on file  Social History Narrative   Not on file   Social Determinants of Health   Financial Resource Strain: Low Risk  (02/10/2023)  Overall Financial Resource Strain (CARDIA)    Difficulty of Paying Living Expenses: Not very hard  Food Insecurity: No Food Insecurity (02/10/2023)   Hunger Vital Sign    Worried About Running Out of Food in the Last Year: Never true    Ran Out of Food in the Last Year: Never true  Transportation Needs: No Transportation Needs (02/10/2023)   PRAPARE - Administrator, Civil Service (Medical): No    Lack of Transportation (Non-Medical): No  Physical Activity: Sufficiently Active (02/10/2023)   Exercise Vital Sign    Days of Exercise per Week: 5 days    Minutes of Exercise per Session: 50 min  Stress: No Stress Concern Present (02/10/2023)   [employer] of Occupational Health - Occupational Stress Questionnaire    Feeling of Stress : Not at  all  Social Connections: Socially Integrated (02/10/2023)   Social Connection and Isolation Panel [NHANES]    Frequency of Communication with Friends and Family: More than three times a week    Frequency of Social Gatherings with Friends and Family: Once a week    Attends Religious Services: More than 4 times per year    Active Member of [employer] or Organizations: Yes    Attends Engineer, structural: More than 4 times per year    Marital Status: Married  Catering manager Violence: Not At Risk (05/01/2019)   Humiliation, Afraid, Rape, and Kick questionnaire    Fear of Current or Ex-Partner: No    Emotionally Abused: No    Physically Abused: No    Sexually Abused: No    Outpatient Medications Prior to Visit  Medication Sig Dispense Refill   cyclobenzaprine (FLEXERIL) 10 MG tablet Take 1 tablet (10 mg total) by mouth at bedtime. 30 tablet 0   ibuprofen (ADVIL) 200 MG tablet Take 200 mg by mouth every 6 (six) hours as needed. 4 tablets     levonorgestrel (MIRENA) 20 MCG/DAY IUD 1 each by Intrauterine route once for 1 dose. 1 each 0   triamcinolone (KENALOG) 0.025 % cream Apply 1 application topically 2 (two) times daily. 30 g 3   No facility-administered medications prior to visit.      ROS:  Review of Systems BREAST: No symptoms   OBJECTIVE:   Vitals:  There were no vitals taken for this visit.  Physical Exam  Results: No results found for this or any previous visit (from the past 24 hour(s)).    IUD Removal Strings of IUD identified and grasped.  IUD removed without problem with ring forceps.  Pt tolerated this well.  IUD noted to be intact.   Assessment/Plan: No diagnosis found.    No orders of the defined types were placed in this encounter.     No follow-ups on file.  Alfonso Shackett B. Linkyn Gobin, PA-C 10/11/2023 3:35 PM

## 2023-10-13 ENCOUNTER — Encounter: Payer: Self-pay | Admitting: Obstetrics and Gynecology

## 2023-10-13 ENCOUNTER — Ambulatory Visit: Payer: BC Managed Care – PPO | Admitting: Obstetrics and Gynecology

## 2023-10-13 VITALS — BP 133/67 | HR 93 | Ht 68.0 in | Wt 193.0 lb

## 2023-10-13 DIAGNOSIS — Z30432 Encounter for removal of intrauterine contraceptive device: Secondary | ICD-10-CM

## 2023-10-13 DIAGNOSIS — T8332XA Displacement of intrauterine contraceptive device, initial encounter: Secondary | ICD-10-CM

## 2023-10-13 DIAGNOSIS — Z30433 Encounter for removal and reinsertion of intrauterine contraceptive device: Secondary | ICD-10-CM

## 2023-10-13 MED ORDER — LEVONORGESTREL 20 MCG/DAY IU IUD
1.0000 | INTRAUTERINE_SYSTEM | Freq: Once | INTRAUTERINE | Status: AC
Start: 2023-10-13 — End: 2023-10-13
  Administered 2023-10-13: 1 via INTRAUTERINE

## 2023-10-13 NOTE — Patient Instructions (Addendum)
I value your feedback and you entrusting us with your care. If you get a [facility] patient survey, I would appreciate you taking the time to let us know about your experience today. Thank you!  Instructions after IUD insertion  Most women experience no significant problems after insertion of an IUD, however minor cramping and spotting for a few days is common. Cramps may be treated with ibuprofen 800mg every 8 hours or Tylenol 650 mg every 4 hours. Contact [facility] immediately if you experience any of the following symptoms during the next week: temperature >99.6 degrees, worsening pelvic pain, abdominal pain, fainting, unusually heavy vaginal bleeding, foul vaginal discharge, or if you think you have expelled the IUD. Nothing inserted in the vagina for 48 hours. You will be scheduled for a follow up visit in approximately four weeks.    

## 2023-11-08 NOTE — Progress Notes (Signed)
   Chief Complaint  Patient presents with   IUD Check    No more bleeding, no concerns.     History of Present Illness:  Elizabeth Case is a 53 y.o. that had a Mirena  IUD placed approximately 1 month ago. Since that time, she denies dyspareunia, pelvic pain, non-menstrual bleeding, vaginal d/c, heavy bleeding. Had 1 wk period after insertion (replaced due to malposition and AUB), but not bleeding since. Doing well.   Review of Systems  Constitutional:  Negative for fever.  Gastrointestinal:  Negative for blood in stool, constipation, diarrhea, nausea and vomiting.  Genitourinary:  Negative for dyspareunia, dysuria, flank pain, frequency, hematuria, urgency, vaginal bleeding, vaginal discharge and vaginal pain.  Musculoskeletal:  Negative for back pain.  Skin:  Negative for rash.    Physical Exam:  BP 104/66   Pulse 83   Ht 5' 8 (1.727 m)   Wt 190 lb (86.2 kg)   LMP 08/05/2023 (Exact Date)   BMI 28.89 kg/m  Body mass index is 28.89 kg/m.  Pelvic exam:  Two IUD strings present seen coming from the cervical os. EGBUS, vaginal vault and cervix: within normal limits   Assessment:   Encounter for routine checking of intrauterine contraceptive device (IUD)  IUD strings present in proper location; pt doing well  Encouraged pt to schedule mammo--order placed by PCP  Plan: F/u if any signs of infection or can no longer feel the strings.   Lauro Manlove B. Derica Leiber, PA-C 11/10/2023 9:23 AM

## 2023-11-10 ENCOUNTER — Ambulatory Visit (INDEPENDENT_AMBULATORY_CARE_PROVIDER_SITE_OTHER): Payer: 59 | Admitting: Obstetrics and Gynecology

## 2023-11-10 ENCOUNTER — Encounter: Payer: Self-pay | Admitting: Obstetrics and Gynecology

## 2023-11-10 VITALS — BP 104/66 | HR 83 | Ht 68.0 in | Wt 190.0 lb

## 2023-11-10 DIAGNOSIS — Z30431 Encounter for routine checking of intrauterine contraceptive device: Secondary | ICD-10-CM

## 2023-11-22 ENCOUNTER — Ambulatory Visit: Payer: Self-pay | Admitting: Nurse Practitioner

## 2023-11-22 ENCOUNTER — Encounter: Payer: Self-pay | Admitting: Nurse Practitioner

## 2023-11-22 VITALS — BP 114/75 | HR 82 | Temp 97.5°F | Ht 68.0 in | Wt 188.6 lb

## 2023-11-22 DIAGNOSIS — N3001 Acute cystitis with hematuria: Secondary | ICD-10-CM | POA: Insufficient documentation

## 2023-11-22 DIAGNOSIS — R8281 Pyuria: Secondary | ICD-10-CM

## 2023-11-22 LAB — MICROSCOPIC EXAMINATION

## 2023-11-22 LAB — URINALYSIS, ROUTINE W REFLEX MICROSCOPIC: Specific Gravity, UA: <1.005 — ABNORMAL LOW (ref 1.005–1.030)

## 2023-11-22 LAB — WET PREP FOR TRICH, YEAST, CLUE
Clue Cell Exam: NEGATIVE
Trichomonas Exam: NEGATIVE
Yeast Exam: NEGATIVE

## 2023-11-22 MED ORDER — NITROFURANTOIN MONOHYD MACRO 100 MG PO CAPS: 100.0000 mg | ORAL_CAPSULE | Freq: Two times a day (BID) | ORAL | 0 refills | Status: AC

## 2023-11-22 MED ORDER — FLUCONAZOLE 150 MG PO TABS: 150.0000 mg | ORAL_TABLET | Freq: Once | ORAL | 0 refills | Status: AC

## 2023-11-22 MED ORDER — CYCLOBENZAPRINE HCL 10 MG PO TABS: 10.0000 mg | ORAL_TABLET | Freq: Every day | ORAL | 2 refills | Status: AC

## 2023-11-22 NOTE — Progress Notes (Signed)
Acute Office Visit  Subjective:     Patient ID: Elizabeth Case, female    DOB: 1971/06/06, 53 y.o.   MRN: [id number]  Chief Complaint  Patient presents with   Urinary Tract Infection    Patient states she has been having burning with urination and noticed a little bit of blood in her urine. States she has taken Azo to try to help symptoms. Also states she took a Macrobid tablet last night prescribed by her OBGYN. States she sometimes takes this after intercourse to help prevent a yeast infection/UTI   Started with symptoms last night around 7 pm.  Initial symptoms frequent urination and burning.  Her GYN has her on Macrobid to take after intercourse due to history of frequent UTIs.  She did take one of these last night, around midnight her symptoms quit enough for her to sleep.  Urinary Tract Infection  This is a new problem. The current episode started yesterday. The problem occurs every urination. The problem has been gradually improving. The quality of the pain is described as aching. The pain is mild. There has been no fever. She is Sexually active. There is No history of pyelonephritis. Associated symptoms include frequency, hematuria (a little bit initially) and urgency. Pertinent negatives include no chills, discharge, flank pain, nausea, sweats or vomiting. She has tried antibiotics and increased fluids (Azo) for the symptoms. The treatment provided mild relief. There is no history of recurrent UTIs, urinary stasis or a urological procedure.   Patient is in today for urinary symptoms.  Review of Systems  Constitutional:  Negative for chills, diaphoresis and fever.  Respiratory:  Negative for cough, shortness of breath and wheezing.   Cardiovascular:  Negative for chest pain, palpitations and orthopnea.  Gastrointestinal:  Negative for nausea and vomiting.  Genitourinary:  Positive for frequency, hematuria (a little bit initially) and urgency. Negative for flank pain.   Neurological: Negative.   Psychiatric/Behavioral: Negative.        Objective:    BP 114/75   Pulse 82   Temp (!) 97.5 F (36.4 C) (Oral)   Ht 5\' 8"  (1.727 m)   Wt 188 lb 9.6 oz (85.5 kg)   SpO2 94%   BMI 28.68 kg/m  BP Readings from Last 3 Encounters:  11/22/23 114/75  11/10/23 104/66  10/13/23 133/67   Wt Readings from Last 3 Encounters:  11/22/23 188 lb 9.6 oz (85.5 kg)  11/10/23 190 lb (86.2 kg)  10/13/23 193 lb (87.5 kg)   Physical Exam Vitals and nursing note reviewed.  Constitutional:      General: She is awake. She is not in acute distress.    Appearance: She is well-developed and well-groomed. She is not ill-appearing or toxic-appearing.  HENT:     Head: Normocephalic.     Right Ear: Hearing and external ear normal.     Left Ear: Hearing and external ear normal.  Eyes:     General: Lids are normal.        Right eye: No discharge.        Left eye: No discharge.     Conjunctiva/sclera: Conjunctivae normal.     Pupils: Pupils are equal, round, and reactive to light.  Neck:     Thyroid: No thyromegaly.     Vascular: No carotid bruit.  Cardiovascular:     Rate and Rhythm: Normal rate and regular rhythm.     Heart sounds: Normal heart sounds. No murmur heard.    No gallop.  Pulmonary:     Effort: Pulmonary effort is normal. No accessory muscle usage or respiratory distress.     Breath sounds: Normal breath sounds.  Abdominal:     General: Bowel sounds are normal. There is no distension.     Palpations: Abdomen is soft.     Tenderness: There is no abdominal tenderness. There is no right CVA tenderness or left CVA tenderness.  Musculoskeletal:     Cervical back: Normal range of motion and neck supple.     Right lower leg: No edema.     Left lower leg: No edema.  Lymphadenopathy:     Cervical: No cervical adenopathy.  Skin:    General: Skin is warm and dry.  Neurological:     Mental Status: She is alert and oriented to person, place, and time.     Deep  Tendon Reflexes: Reflexes are normal and symmetric.     Reflex Scores:      Brachioradialis reflexes are 2+ on the right side and 2+ on the left side.      Patellar reflexes are 2+ on the right side and 2+ on the left side. Psychiatric:        Attention and Perception: Attention normal.        Mood and Affect: Mood normal.        Speech: Speech normal.        Behavior: Behavior normal. Behavior is cooperative.        Thought Content: Thought content normal.    No results found for any visits on 11/22/23.      Assessment & Plan:   Problem List Items Addressed This Visit       Genitourinary   Acute cystitis with hematuria - Primary   Acute with symptoms <24 hours.  Wet prep negative.  UA noting + NIT, 1+ BLD, 3+ LEUK, and few bacteria.  She did get benefit from one dose of Macrobid at home, will send in BID dosing for 5 days and Diflucan as yeast prevention.  Discussed plan of care with her.      Relevant Orders   Urinalysis, Routine w reflex microscopic   WET PREP FOR TRICH, YEAST, CLUE   Other Visit Diagnoses       Pyuria       Urine sent for culture.   Relevant Orders   Urine Culture       Meds ordered this encounter  Medications   cyclobenzaprine (FLEXERIL) 10 MG tablet    Sig: Take 1 tablet (10 mg total) by mouth at bedtime.    Dispense:  60 tablet    Refill:  2   nitrofurantoin, macrocrystal-monohydrate, (MACROBID) 100 MG capsule    Sig: Take 1 capsule (100 mg total) by mouth 2 (two) times daily for 5 days.    Dispense:  10 capsule    Refill:  0   fluconazole (DIFLUCAN) 150 MG tablet    Sig: Take 1 tablet (150 mg total) by mouth once for 1 dose.    Dispense:  1 tablet    Refill:  0    Return if symptoms worsen or fail to improve.  Marjie Skiff, NP

## 2023-11-22 NOTE — Assessment & Plan Note (Signed)
Acute with symptoms <24 hours.  Wet prep negative.  UA noting + NIT, 1+ BLD, 3+ LEUK, and few bacteria.  She did get benefit from one dose of Macrobid at home, will send in BID dosing for 5 days and Diflucan as yeast prevention.  Discussed plan of care with her.

## 2023-11-22 NOTE — Patient Instructions (Signed)

## 2023-11-24 ENCOUNTER — Encounter: Payer: Self-pay | Admitting: Nurse Practitioner

## 2023-11-24 LAB — URINE CULTURE

## 2023-11-24 NOTE — Progress Notes (Signed)
Contacted via MyChart   Good morning, your culture returned showing no growth, which I was surprised at.  Finish medication and if symptoms continue let me know:)

## 2023-11-30 ENCOUNTER — Ambulatory Visit
Admission: RE | Admit: 2023-11-30 | Discharge: 2023-11-30 | Disposition: A | Discharge status: Home / Self Care | Payer: 59 | Source: Ambulatory Visit | Attending: Nurse Practitioner | Admitting: Nurse Practitioner

## 2023-11-30 DIAGNOSIS — Z1231 Encounter for screening mammogram for malignant neoplasm of breast: Secondary | ICD-10-CM | POA: Diagnosis present

## 2023-12-01 ENCOUNTER — Encounter: Payer: Self-pay | Admitting: Nurse Practitioner

## 2023-12-01 NOTE — Progress Notes (Signed)
Contacted via MyChart   Normal mammogram, may repeat in one year:)

## 2024-02-15 ENCOUNTER — Encounter: Payer: Self-pay | Admitting: Nurse Practitioner

## 2024-02-15 MED ORDER — NITROFURANTOIN MONOHYD MACRO 100 MG PO CAPS: ORAL_CAPSULE | ORAL | 4 refills | Status: AC

## 2024-02-16 ENCOUNTER — Ambulatory Visit: Payer: Self-pay

## 2024-02-16 ENCOUNTER — Encounter: Payer: Self-pay | Admitting: Pediatrics

## 2024-02-16 ENCOUNTER — Ambulatory Visit: Admitting: Pediatrics

## 2024-02-16 VITALS — BP 122/85 | HR 98 | Temp 97.4°F | Wt 188.0 lb

## 2024-02-16 DIAGNOSIS — R399 Unspecified symptoms and signs involving the genitourinary system: Secondary | ICD-10-CM

## 2024-02-16 DIAGNOSIS — N949 Unspecified condition associated with female genital organs and menstrual cycle: Secondary | ICD-10-CM

## 2024-02-16 MED ORDER — FLUCONAZOLE 150 MG PO TABS: 150.0000 mg | ORAL_TABLET | Freq: Once | ORAL | 0 refills | Status: AC

## 2024-02-16 MED ORDER — NITROFURANTOIN MONOHYD MACRO 100 MG PO CAPS
100.0000 mg | ORAL_CAPSULE | Freq: Two times a day (BID) | ORAL | 0 refills | Status: AC
Start: 2024-02-16 — End: 2024-02-21

## 2024-02-16 NOTE — Telephone Encounter (Signed)
 Scheduled for 4:20 pm this afternoon.

## 2024-02-16 NOTE — Progress Notes (Signed)
 Office Visit  BP 122/85   Pulse 98   Temp (!) 97.4 F (36.3 C) (Oral)   Wt 188 lb (85.3 kg)   SpO2 98%   BMI 28.59 kg/m    Subjective:    Patient ID: Elizabeth Case, female    DOB: 03-12-1971, 53 y.o.   MRN: [id number]  HPI: Elizabeth Case is a 53 y.o. female  Chief Complaint  Patient presents with   Dysuria    #urinary sx Pt have increase urinary urge, frequency, abdominal pressure Started Friday, took azo without resolution Dribbling and issues with initiating full void Denies fevers, chills, nausea, vomiting  Relevant past medical, surgical, family and social history reviewed and updated as indicated. Interim medical history since our last visit reviewed. Allergies and medications reviewed and updated.  ROS per HPI unless specifically indicated above     Objective:    BP 122/85   Pulse 98   Temp (!) 97.4 F (36.3 C) (Oral)   Wt 188 lb (85.3 kg)   SpO2 98%   BMI 28.59 kg/m   Wt Readings from Last 3 Encounters:  02/16/24 188 lb (85.3 kg)  11/22/23 188 lb 9.6 oz (85.5 kg)  11/10/23 190 lb (86.2 kg)     Physical Exam Constitutional:      Appearance: Normal appearance.  Pulmonary:     Effort: Pulmonary effort is normal.  Abdominal:     Tenderness: There is no right CVA tenderness or left CVA tenderness.  Musculoskeletal:        General: Normal range of motion.  Skin:    Comments: Normal skin color  Neurological:     General: No focal deficit present.     Mental Status: She is alert. Mental status is at baseline.  Psychiatric:        Mood and Affect: Mood normal.        Behavior: Behavior normal.        Thought Content: Thought content normal.         02/16/2024    4:26 PM 05/26/2023    8:08 AM 02/10/2023   11:14 AM 01/25/2023   10:41 AM 11/23/2022    1:15 PM  Depression screen PHQ 2/9  Decreased Interest 0 1 0 0 1  Down, Depressed, Hopeless 0 1 0 0 1  PHQ - 2 Score 0 2 0 0 2  Altered sleeping 0 2 0 0 2  Tired, decreased energy 0 1 1  0 1  Change in appetite 0 1 1 0 1  Feeling bad or failure about yourself  0 0 0 0 0  Trouble concentrating 0 1 0 0 0  Moving slowly or fidgety/restless 0 0 0 0 0  Suicidal thoughts 0 0 0 0 0  PHQ-9 Score 0 7 2 0 6  Difficult doing work/chores Not difficult at all Not difficult at all Somewhat difficult Not difficult at all Not difficult at all       02/16/2024    4:26 PM 05/26/2023    8:08 AM 02/10/2023   11:15 AM 01/25/2023   10:41 AM  GAD 7 : Generalized Anxiety Score  Nervous, Anxious, on Edge 0 0 0 0  Control/stop worrying 0 0 0 0  Worry too much - different things 0 0 0 0  Trouble relaxing 0 0 0 0  Restless 0 0 0 0  Easily annoyed or irritable 0 1 0 0  Afraid - awful might happen 0 0 0 0  Total  GAD 7 Score 0 1 0 0  Anxiety Difficulty Not difficult at all Not difficult at all Not difficult at all Not difficult at all       Assessment & Plan:  Assessment & Plan   Urinary tract infection symptoms U/a in office and sx confirmed UTI. Sent macrobid  and diflucan  for after abx. Consider urology eval if ongoing recurrence.  -     Urinalysis, Routine w reflex microscopic -     Urine Culture -     Nitrofurantoin  Monohyd Macro; Take 1 capsule (100 mg total) by mouth 2 (two) times daily for 5 days.  Dispense: 10 capsule; Refill: 0 -     Microscopic Examination  Vaginal discomfort Neg swab, diflucan  for after abx. -     WET PREP FOR TRICH, YEAST, CLUE -     Fluconazole ; Take 1 tablet (150 mg total) by mouth once for 1 dose.  Dispense: 1 tablet; Refill: 0    Follow up plan: Return if symptoms worsen or fail to improve.  Hadassah Letters, MD

## 2024-02-16 NOTE — Telephone Encounter (Signed)
 Copied from CRM [id number]. Topic: Clinical - Red Word Triage >> Feb 16, 2024 12:49 PM Marissa P wrote: Red Word that prompted transfer to Nurse Triage: Patient called experiencing severe pain from uti down there. Patient stated that it feels like she has to urinate but she can't and she has really bad cramps and really needs help before tomorrow.  Chief Complaint: Urinary symptoms Symptoms: Urinary urge, urinary frequency, abdominal pain Frequency: Today Pertinent Negatives: Patient denies rever Disposition: [] ED /[] Urgent Care (no appt availability in office) / [x] Appointment(In office/virtual)/ []  [facility]/ [] Home Care/ [] Refused Recommended Disposition /[] [facility]/ []  Follow-up with PCP Additional Notes: Patient called in to report urinary symptoms. Patient stated symptoms started on Friday, she took Azo and symptoms subsided, but returned this morning. Patient reported urinary urge, urinary frequency and abdominal burning. Patient stated she was able to use the restroom fully at 10:30 am. Patient is still able to use the restroom, but stated it is painful and minimal urine comes out. Patient denied fever and blood in urine. Advised patient to be seen within 24 hours, per protocol. No availability with PCP. Scheduled with alternate provider in office. Provided care advice and instructed patient to call back if symptoms worsen, especially if her bladder starts to feel full and she is unable to urinate. Patient complied.   Reason for Disposition  Urinating more frequently than usual (i.e., frequency)  Answer Assessment - Initial Assessment Questions 1. SYMPTOM: "What's the main symptom you're concerned about?" (e.g., frequency, incontinence)     Urinary urge and abdominal pain 2. ONSET: "When did the symptoms start?"     Last Friday and this morning 3. PAIN: "Is there any pain?" If Yes, ask: "How bad is it?" (Scale: 1-10; mild, moderate, severe)     Rates pain a 6 or  7 4. CAUSE: "What do you think is causing the symptoms?"     UTI 5. OTHER SYMPTOMS: "Do you have any other symptoms?" (e.g., blood in urine, fever, flank pain, pain with urination)     States she last used the restroom on 10:30 am, urinary frequency, urinary urgency, denies fever, denies blood in urine  Took Azo on Friday and symptoms subsided until this morning  Protocols used: Urinary Symptoms-A-AH

## 2024-02-17 ENCOUNTER — Ambulatory Visit: Admitting: Nurse Practitioner

## 2024-02-17 LAB — URINALYSIS, ROUTINE W REFLEX MICROSCOPIC
Bilirubin, UA: NEGATIVE
Ketones, UA: NEGATIVE
Leukocytes,UA: NEGATIVE
Nitrite, UA: POSITIVE — AB
Protein,UA: NEGATIVE
Specific Gravity, UA: 1.015 (ref 1.005–1.030)
Urobilinogen, Ur: 1 mg/dL (ref 0.2–1.0)
pH, UA: 7.5 (ref 5.0–7.5)

## 2024-02-17 LAB — MICROSCOPIC EXAMINATION

## 2024-02-18 LAB — URINE CULTURE

## 2024-03-30 ENCOUNTER — Encounter: Payer: Self-pay | Admitting: Nurse Practitioner

## 2024-03-30 MED ORDER — ONDANSETRON 4 MG PO TBDP: 4.0000 mg | ORAL_TABLET | Freq: Three times a day (TID) | ORAL | 0 refills | Status: AC | PRN

## 2024-05-30 ENCOUNTER — Encounter: Payer: BC Managed Care – PPO | Admitting: Nurse Practitioner

## 2024-06-02 DIAGNOSIS — E7800 Pure hypercholesterolemia, unspecified: Secondary | ICD-10-CM | POA: Insufficient documentation

## 2024-06-02 NOTE — Patient Instructions (Signed)
 Be Involved in Caring For Your Health:  Taking Medications When medications are taken as directed, they can greatly improve your health. But if they are not taken as prescribed, they may not work. In some cases, not taking them correctly can be harmful. To help ensure your treatment remains effective and safe, understand your medications and how to take them. Bring your medications to each visit for review by your provider.  Your lab results, notes, and after visit summary will be available on My Chart. We strongly encourage you to use this feature. If lab results are abnormal the clinic will contact you with the appropriate steps. If the clinic does not contact you assume the results are satisfactory. You can always view your results on My Chart. If you have questions regarding your health or results, please contact the clinic during office hours. You can also ask questions on My Chart.  We at [facility] are grateful that you chose [place] to provide your care. We strive to provide evidence-based and compassionate care and are always looking for feedback. If you get a survey from the clinic please complete this so we can hear your opinions.  Healthy Eating, Adult Healthy eating may help you get and keep a healthy body weight, reduce the risk of chronic disease, and live a long and productive life. It is important to follow a healthy eating pattern. Your nutritional and calorie needs should be met mainly by different nutrient-rich foods. What are tips for following this plan? Reading food labels Read labels and choose the following: Reduced or low sodium products. Juices with 100% fruit juice. Foods with low saturated fats (<3 g per serving) and high polyunsaturated and monounsaturated fats. Foods with whole grains, such as whole wheat, cracked wheat, brown rice, and wild rice. Whole grains that are fortified with folic acid. This is recommended for females who are pregnant or who want  to become pregnant. Read labels and do not eat or drink the following: Foods or drinks with added sugars. These include foods that contain brown sugar, corn sweetener, corn syrup, dextrose, fructose, glucose, high-fructose corn syrup, honey, invert sugar, lactose, malt syrup, maltose, molasses, raw sugar, sucrose, trehalose, or turbinado sugar. Limit your intake of added sugars to less than 10% of your total daily calories. Do not eat more than the following amounts of added sugar per day: 6 teaspoons (25 g) for females. 9 teaspoons (38 g) for males. Foods that contain processed or refined starches and grains. Refined grain products, such as white flour, degermed cornmeal, white bread, and white rice. Shopping Choose nutrient-rich snacks, such as vegetables, whole fruits, and nuts. Avoid high-calorie and high-sugar snacks, such as potato chips, fruit snacks, and candy. Use oil-based dressings and spreads on foods instead of solid fats such as butter, margarine, sour cream, or cream cheese. Limit pre-made sauces, mixes, and "instant" products such as flavored rice, instant noodles, and ready-made pasta. Try more plant-protein sources, such as tofu, tempeh, black beans, edamame, lentils, nuts, and seeds. Explore eating plans such as the Mediterranean diet or vegetarian diet. Try heart-healthy dips made with beans and healthy fats like hummus and guacamole. Vegetables go great with these. Cooking Use oil to saut or stir-fry foods instead of solid fats such as butter, margarine, or lard. Try baking, boiling, grilling, or broiling instead of frying. Remove the fatty part of meats before cooking. Steam vegetables in water or broth. Meal planning  At meals, imagine dividing your plate into fourths: One-half of  your plate is fruits and vegetables. One-fourth of your plate is whole grains. One-fourth of your plate is protein, especially lean meats, poultry, eggs, tofu, beans, or nuts. Include  low-fat dairy as part of your daily diet. Lifestyle Choose healthy options in all settings, including home, work, school, restaurants, or stores. Prepare your food safely: Wash your hands after handling raw meats. Where you prepare food, keep surfaces clean by regularly washing with hot, soapy water. Keep raw meats separate from ready-to-eat foods, such as fruits and vegetables. Cook seafood, meat, poultry, and eggs to the recommended temperature. Get a food thermometer. Store foods at safe temperatures. In general: Keep cold foods at 76F (4.4C) or below. Keep hot foods at 176F (60C) or above. Keep your freezer at [facility] (-17.8C) or below. Foods are not safe to eat if they have been between the temperatures of 40-176F (4.4-60C) for more than 2 hours. What foods should I eat? Fruits Aim to eat 1-2 cups of fresh, canned (in natural juice), or frozen fruits each day. One cup of fruit equals 1 small apple, 1 large banana, 8 large strawberries, 1 cup (237 g) canned fruit,  cup (82 g) dried fruit, or 1 cup (240 mL) 100% juice. Vegetables Aim to eat 2-4 cups of fresh and frozen vegetables each day, including different varieties and colors. One cup of vegetables equals 1 cup (91 g) broccoli or cauliflower florets, 2 medium carrots, 2 cups (150 g) raw, leafy greens, 1 large tomato, 1 large bell pepper, 1 large sweet potato, or 1 medium white potato. Grains Aim to eat 5-10 ounce-equivalents of whole grains each day. Examples of 1 ounce-equivalent of grains include 1 slice of bread, 1 cup (40 g) ready-to-eat cereal, 3 cups (24 g) popcorn, or  cup (93 g) cooked rice. Meats and other proteins Try to eat 5-7 ounce-equivalents of protein each day. Examples of 1 ounce-equivalent of protein include 1 egg,  oz nuts (12 almonds, 24 pistachios, or 7 walnut halves), 1/4 cup (90 g) cooked beans, 6 tablespoons (90 g) hummus or 1 tablespoon (16 g) peanut butter. A cut of meat or fish that is the size of a deck  of cards is about 3-4 ounce-equivalents (85 g). Of the protein you eat each week, try to have at least 8 sounce (227 g) of seafood. This is about 2 servings per week. This includes salmon, trout, herring, sardines, and anchovies. Dairy Aim to eat 3 cup-equivalents of fat-free or low-fat dairy each day. Examples of 1 cup-equivalent of dairy include 1 cup (240 mL) milk, 8 ounces (250 g) yogurt, 1 ounces (44 g) natural cheese, or 1 cup (240 mL) fortified soy milk. Fats and oils Aim for about 5 teaspoons (21 g) of fats and oils per day. Choose monounsaturated fats, such as canola and olive oils, mayonnaise made with olive oil or avocado oil, avocados, peanut butter, and most nuts, or polyunsaturated fats, such as sunflower, corn, and soybean oils, walnuts, pine nuts, sesame seeds, sunflower seeds, and flaxseed. Beverages Aim for 6 eight-ounce glasses of water per day. Limit coffee to 3-5 eight-ounce cups per day. Limit caffeinated beverages that have added calories, such as soda and energy drinks. If you drink alcohol: Limit how much you have to: 0-1 drink a day if you are female. 0-2 drinks a day if you are female. Know how much alcohol is in your drink. In the [place] one drink is one 12 oz bottle of beer (355 mL), one 5 oz glass of wine (  148 mL), or one 1 oz glass of hard liquor (44 mL). Seasoning and other foods Try not to add too much salt to your food. Try using herbs and spices instead of salt. Try not to add sugar to food. This information is based on [place] nutrition guidelines. To learn more, visit [employer].be. Exact amounts may vary. You may need different amounts. This information is not intended to replace advice given to you by your health care provider. Make sure you discuss any questions you have with your health care provider. Document Revised: 07/19/2022 Document Reviewed: 07/19/2022 Elsevier Patient Education  2024 [employer].

## 2024-06-08 ENCOUNTER — Encounter: Payer: Self-pay | Admitting: Nurse Practitioner

## 2024-06-08 ENCOUNTER — Ambulatory Visit (INDEPENDENT_AMBULATORY_CARE_PROVIDER_SITE_OTHER): Payer: Self-pay | Admitting: Nurse Practitioner

## 2024-06-08 VITALS — BP 111/76 | HR 89 | Temp 97.9°F | Ht 68.0 in | Wt 199.8 lb

## 2024-06-08 DIAGNOSIS — Z0000 Encounter for general adult medical examination without abnormal findings: Secondary | ICD-10-CM

## 2024-06-08 DIAGNOSIS — I493 Ventricular premature depolarization: Secondary | ICD-10-CM | POA: Diagnosis not present

## 2024-06-08 DIAGNOSIS — E7800 Pure hypercholesterolemia, unspecified: Secondary | ICD-10-CM | POA: Diagnosis not present

## 2024-06-08 DIAGNOSIS — L2084 Intrinsic (allergic) eczema: Secondary | ICD-10-CM | POA: Diagnosis not present

## 2024-06-08 DIAGNOSIS — G43009 Migraine without aura, not intractable, without status migrainosus: Secondary | ICD-10-CM

## 2024-06-08 DIAGNOSIS — F5101 Primary insomnia: Secondary | ICD-10-CM

## 2024-06-08 NOTE — Assessment & Plan Note (Signed)
Ongoing.  Followed by cardiology.  Did not tolerate beta blocker therapy.  Continue to monitor and continue collaboration with cardiology.  Lipid panel and TSH today.  May benefit from a PRN Propranolol if present often in future.

## 2024-06-08 NOTE — Assessment & Plan Note (Signed)
 Noted on past labs, recheck today and continue focus on healthy diet and regular exercise.

## 2024-06-08 NOTE — Progress Notes (Signed)
 BP 111/76   Pulse 89   Temp 97.9 F (36.6 C) (Oral)   Ht 5' 8 (1.727 m)   Wt 199 lb 12.8 oz (90.6 kg)   SpO2 98%   BMI 30.38 kg/m    Subjective:    Patient ID: Elizabeth Case, female    DOB: 12-Aug-1971, 53 y.o.   MRN: [id number]  HPI: Elizabeth Case is a 53 y.o. female presenting on 06/08/2024 for comprehensive medical examination. Current medical complaints include:none  She currently lives with: husband Menopausal Symptoms: no  PALPITATIONS Last echo on 08/18/22 noted EF 55-60%. Last visit with cardiology was 06/10/23 with no changes made, to return in a couple years.  Has not tolerated BB in past.  No recent migraines.  Her eczema has been well controlled. Aspirin: no Recurrent headaches: no Visual changes: no Palpitations: yes Dyspnea: no Chest pain: no Lower extremity edema: no Dizzy/lightheaded: no  The 10-year ASCVD risk score (Arnett DK, et al., 2019) is: 0.9%   Values used to calculate the score:     Age: 46 years     Clincally relevant sex: Female     Is Non-Hispanic African American: No     Diabetic: No     Tobacco smoker: No     Systolic Blood Pressure: 111 mmHg     Is BP treated: No     HDL Cholesterol: 61 mg/dL     Total Cholesterol: 182 mg/dL  Depression Screen done today and results listed below:     06/08/2024    8:16 AM 02/16/2024    4:26 PM 05/26/2023    8:08 AM 02/10/2023   11:14 AM 01/25/2023   10:41 AM  Depression screen PHQ 2/9  Decreased Interest 0 0 1 0 0  Down, Depressed, Hopeless 0 0 1 0 0  PHQ - 2 Score 0 0 2 0 0  Altered sleeping 2 0 2 0 0  Tired, decreased energy 2 0 1 1 0  Change in appetite 2 0 1 1 0  Feeling bad or failure about yourself  0 0 0 0 0  Trouble concentrating 0 0 1 0 0  Moving slowly or fidgety/restless 0 0 0 0 0  Suicidal thoughts 0 0 0 0 0  PHQ-9 Score 6 0 7 2 0  Difficult doing work/chores Not difficult at all Not difficult at all Not difficult at all Somewhat difficult Not difficult at all      06/08/2024     8:16 AM 02/16/2024    4:26 PM 05/26/2023    8:08 AM 02/10/2023   11:15 AM  GAD 7 : Generalized Anxiety Score  Nervous, Anxious, on Edge 0 0 0 0  Control/stop worrying 0 0 0 0  Worry too much - different things 0 0 0 0  Trouble relaxing 0 0 0 0  Restless 1 0 0 0  Easily annoyed or irritable 1 0 1 0  Afraid - awful might happen 0 0 0 0  Total GAD 7 Score 2 0 1 0  Anxiety Difficulty Not difficult at all Not difficult at all Not difficult at all Not difficult at all      11/23/2022    1:15 PM 01/25/2023   10:41 AM 05/26/2023    8:07 AM 02/16/2024    4:25 PM 06/08/2024    8:16 AM  Fall Risk  Falls in the past year? 0 0 0 0 0  Was there an injury with Fall? 0 0 0 0 0  Fall Risk Category Calculator 0 0 0 0 0  Patient at Risk for Falls Due to No Fall Risks No Fall Risks No Fall Risks No Fall Risks No Fall Risks  Fall risk Follow up Falls evaluation completed  Falls evaluation completed Falls evaluation completed Falls evaluation completed Falls evaluation completed     Data saved with a previous flowsheet row definition    Functional Status Survey: Is the patient deaf or have difficulty hearing?: No Does the patient have difficulty seeing, even when wearing glasses/contacts?: No Does the patient have difficulty concentrating, remembering, or making decisions?: No Does the patient have difficulty walking or climbing stairs?: No Does the patient have difficulty dressing or bathing?: No Does the patient have difficulty doing errands alone such as visiting a doctor's office or shopping?: No   Past Medical History:  Past Medical History:  Diagnosis Date   Allergic rhinitis    Allergy    Kiwi, latex, walnuts   Eczema    GERD (gastroesophageal reflux disease)    Heart murmur July   PVC  Not really heart murmur   Migraines    1x/mo   PONV (postoperative nausea and vomiting)    Vitamin D  deficiency disease     Surgical History:  Past Surgical History:  Procedure Laterality Date    BOTOX  INJECTION N/A 08/06/2022   Procedure: BOTOX  INJECTION;  Surgeon: Lane Shope, MD;  Location: [facility];  Service: General;  Laterality: N/A;   BREAST BIOPSY     BREAST EXCISIONAL BIOPSY Right    BREAST REDUCTION SURGERY  1994   BREAST SURGERY Right 2012   lumpectomy   CHOLECYSTECTOMY  1998   COLONOSCOPY WITH PROPOFOL  N/A 09/08/2016   Procedure: COLONOSCOPY WITH PROPOFOL ;  Surgeon: Ruel Kung, MD;  Location: [facility];  Service: Endoscopy;  Laterality: N/A;  Latex sensitivity   RECTAL EXAM UNDER ANESTHESIA N/A 08/06/2022   Procedure: RECTAL EXAM UNDER ANESTHESIA;  Surgeon: Lane Shope, MD;  Location: [facility];  Service: General;  Laterality: N/A;   REDUCTION MAMMAPLASTY Bilateral 1994   SPHINCTEROTOMY N/A 08/06/2022   Procedure: ANNETT, chemical;  Surgeon: Lane Shope, MD;  Location: [facility];  Service: General;  Laterality: N/A;   WISDOM TOOTH EXTRACTION      Medications:  Current Outpatient Medications on File Prior to Visit  Medication Sig   cyclobenzaprine  (FLEXERIL ) 10 MG tablet Take 1 tablet (10 mg total) by mouth at bedtime.   ibuprofen (ADVIL) 200 MG tablet Take 200 mg by mouth every 6 (six) hours as needed. 4 tablets   levonorgestrel  (MIRENA ) 20 MCG/DAY IUD 1 each by Intrauterine route once.   nitrofurantoin , macrocrystal-monohydrate, (MACROBID ) 100 MG capsule Take one tablet by mouth after intercourse for prevention.   ondansetron  (ZOFRAN -ODT) 4 MG disintegrating tablet Take 1 tablet (4 mg total) by mouth every 8 (eight) hours as needed for nausea or vomiting.   triamcinolone  (KENALOG ) 0.025 % cream Apply 1 application topically 2 (two) times daily.   No current facility-administered medications on file prior to visit.    Allergies:  Allergies  Allergen Reactions   Kiwi Extract Shortness Of Breath   Latex Dermatitis   Other Swelling    Walnuts - angioedema    Social History:  Social History   Socioeconomic History   Marital  status: Married    Spouse name: Ludie   Number of children: 2   Years of education: 16   Highest education level: Bachelor's degree (e.g., BA, AB, BS)  Occupational History  Not on file  Tobacco Use   Smoking status: Never    Passive exposure: Never   Smokeless tobacco: Never  Vaping Use   Vaping status: Never Used  Substance and Sexual Activity   Alcohol use: Yes    Alcohol/week: 0.0 standard drinks of alcohol    Comment: holidays   Drug use: No   Sexual activity: Yes    Birth control/protection: I.U.D.    Comment: Mirena   Other Topics Concern   Not on file  Social History Narrative   Not on file   Social Drivers of Health   Financial Resource Strain: Low Risk  (06/08/2024)   Overall Financial Resource Strain (CARDIA)    Difficulty of Paying Living Expenses: Not hard at all  Food Insecurity: No Food Insecurity (06/08/2024)   Hunger Vital Sign    Worried About Running Out of Food in the Last Year: Never true    Ran Out of Food in the Last Year: Never true  Transportation Needs: No Transportation Needs (06/08/2024)   PRAPARE - Administrator, Civil Service (Medical): No    Lack of Transportation (Non-Medical): No  Physical Activity: Sufficiently Active (06/08/2024)   Exercise Vital Sign    Days of Exercise per Week: 5 days    Minutes of Exercise per Session: 50 min  Stress: No Stress Concern Present (06/08/2024)   [employer] of Occupational Health - Occupational Stress Questionnaire    Feeling of Stress: Not at all  Social Connections: Socially Integrated (06/08/2024)   Social Connection and Isolation Panel    Frequency of Communication with Friends and Family: More than three times a week    Frequency of Social Gatherings with Friends and Family: Twice a week    Attends Religious Services: More than 4 times per year    Active Member of [employer] or Organizations: Yes    Attends Engineer, structural: More than 4 times per year    Marital Status: Married   Catering manager Violence: Not At Risk (06/08/2024)   Humiliation, Afraid, Rape, and Kick questionnaire    Fear of Current or Ex-Partner: No    Emotionally Abused: No    Physically Abused: No    Sexually Abused: No   Social History   Tobacco Use  Smoking Status Never   Passive exposure: Never  Smokeless Tobacco Never   Social History   Substance and Sexual Activity  Alcohol Use Yes   Alcohol/week: 0.0 standard drinks of alcohol   Comment: holidays    Family History:  Family History  Problem Relation Age of Onset   Hypertension Mother    Pulmonary fibrosis Mother    Hypertension Father    Cancer Father 45       prostate    Stroke Maternal Grandmother    Cancer Paternal Grandmother        breast   Breast cancer Paternal Grandmother    Stroke Paternal Grandfather        cerebral aneurysm caused the stroke   Aneurysm Paternal Grandfather    Diabetes Neg Hx    Heart disease Neg Hx     Past medical history, surgical history, medications, allergies, family history and social history reviewed with patient today and changes made to appropriate areas of the chart.   ROS All other ROS negative except what is listed above and in the HPI.      Objective:    BP 111/76   Pulse 89   Temp 97.9 F (36.6  C) (Oral)   Ht 5' 8 (1.727 m)   Wt 199 lb 12.8 oz (90.6 kg)   SpO2 98%   BMI 30.38 kg/m   Wt Readings from Last 3 Encounters:  06/08/24 199 lb 12.8 oz (90.6 kg)  02/16/24 188 lb (85.3 kg)  11/22/23 188 lb 9.6 oz (85.5 kg)    Physical Exam Vitals and nursing note reviewed.  Constitutional:      General: She is awake. She is not in acute distress.    Appearance: She is well-developed and well-groomed. She is not ill-appearing or toxic-appearing.  HENT:     Head: Normocephalic and atraumatic.     Right Ear: Hearing, tympanic membrane, ear canal and external ear normal. No drainage.     Left Ear: Hearing, tympanic membrane, ear canal and external ear normal. No  drainage.     Nose: Nose normal.     Right Sinus: No maxillary sinus tenderness or frontal sinus tenderness.     Left Sinus: No maxillary sinus tenderness or frontal sinus tenderness.     Mouth/Throat:     Mouth: Mucous membranes are moist.     Pharynx: Oropharynx is clear. Uvula midline. No pharyngeal swelling, oropharyngeal exudate or posterior oropharyngeal erythema.  Eyes:     General: Lids are normal.        Right eye: No discharge.        Left eye: No discharge.     Extraocular Movements: Extraocular movements intact.     Conjunctiva/sclera: Conjunctivae normal.     Pupils: Pupils are equal, round, and reactive to light.     Visual Fields: Right eye visual fields normal and left eye visual fields normal.  Neck:     Thyroid: No thyromegaly.     Vascular: No carotid bruit.     Trachea: Trachea normal.  Cardiovascular:     Rate and Rhythm: Normal rate and regular rhythm.     Heart sounds: Normal heart sounds. No murmur heard.    No gallop.  Pulmonary:     Effort: Pulmonary effort is normal. No accessory muscle usage or respiratory distress.     Breath sounds: Normal breath sounds.  Abdominal:     General: Bowel sounds are normal.     Palpations: Abdomen is soft. There is no hepatomegaly or splenomegaly.     Tenderness: There is no abdominal tenderness.  Musculoskeletal:        General: Normal range of motion.     Cervical back: Normal range of motion and neck supple.     Right lower leg: No edema.     Left lower leg: No edema.  Lymphadenopathy:     Head:     Right side of head: No submental, submandibular, tonsillar, preauricular or posterior auricular adenopathy.     Left side of head: No submental, submandibular, tonsillar, preauricular or posterior auricular adenopathy.     Cervical: No cervical adenopathy.  Skin:    General: Skin is warm and dry.     Capillary Refill: Capillary refill takes less than 2 seconds.     Findings: No rash.  Neurological:     Mental  Status: She is alert and oriented to person, place, and time.     Gait: Gait is intact.     Deep Tendon Reflexes: Reflexes are normal and symmetric.     Reflex Scores:      Brachioradialis reflexes are 2+ on the right side and 2+ on the left side.  Patellar reflexes are 2+ on the right side and 2+ on the left side. Psychiatric:        Attention and Perception: Attention normal.        Mood and Affect: Mood normal.        Speech: Speech normal.        Behavior: Behavior normal. Behavior is cooperative.        Thought Content: Thought content normal.        Judgment: Judgment normal.    Results for orders placed or performed in visit on 02/16/24  Urine Culture   Collection Time: 02/16/24  4:31 PM   Specimen: Urine   UR  Result Value Ref Range   Urine Culture, Routine Final report    Organism ID, Bacteria Comment   Microscopic Examination   Collection Time: 02/16/24  4:31 PM   Urine  Result Value Ref Range   WBC, UA 0-5 0 - 5 /hpf   RBC, Urine 0-2 0 - 2 /hpf   Epithelial Cells (non renal) 0-10 0 - 10 /hpf   Bacteria, UA Few None seen/Few  Urinalysis, Routine w reflex microscopic   Collection Time: 02/16/24  4:31 PM  Result Value Ref Range   Specific Gravity, UA 1.015 1.005 - 1.030   pH, UA 7.5 5.0 - 7.5   Color, UA Orange Yellow   Appearance Ur Hazy (A) Clear   Leukocytes,UA Negative Negative   Protein,UA Negative Negative/Trace   Glucose, UA Trace (A) Negative   Ketones, UA Negative Negative   RBC, UA 1+ (A) Negative   Bilirubin, UA Negative Negative   Urobilinogen, Ur 1.0 0.2 - 1.0 mg/dL   Nitrite, UA Positive (A) Negative   Microscopic Examination See below:       Assessment & Plan:   Problem List Items Addressed This Visit       Cardiovascular and Mediastinum   Migraines   Chronic, stable with no recent migraines.  IUD in place. Monitor closely.      Relevant Orders   TSH   Frequent unifocal PVCs - Primary   Ongoing.  Followed by cardiology.  Did not  tolerate beta blocker therapy.  Continue to monitor and continue collaboration with cardiology.  Lipid panel and TSH today.  May benefit from a PRN Propranolol if present often in future.      Relevant Orders   Comprehensive metabolic panel with GFR     Musculoskeletal and Integument   Eczema   Chronic, stable.  Will continue current medication regimen which is offering benefit.  If ongoing discussed possible need for referral to dermatology and use of monoclonal antibody treatment.      Relevant Orders   CBC with Differential/Platelet     Other   Elevated low density lipoprotein (LDL) cholesterol level   Noted on past labs, recheck today and continue focus on healthy diet and regular exercise.      Relevant Orders   Comprehensive metabolic panel with GFR   Lipid Panel w/o Chol/HDL Ratio   Other Visit Diagnoses       Encounter for annual physical exam       Annual physical today with labs and health maintenance reviewed, discussed with patient.        Follow up plan: Return in about 1 year (around 06/08/2025) for Annual Physical.   LABORATORY TESTING:  - Pap smear: Up To Date  IMMUNIZATIONS:   - Tdap: Tetanus vaccination status reviewed: last tetanus booster within 10 years. - Influenza:  Up to date - Pneumovax: Not applicable - Prevnar: will obtain in future - COVID: Up to date - HPV: Not applicable - Shingrix vaccine: will obtain in future  SCREENING: -Mammogram: Up to date -- due next 11/29/24 - Colonoscopy: Up to date -- due next 09/08/2026 - Bone Density: Not applicable  -Hearing Test: Not applicable  -Spirometry: Not applicable   PATIENT COUNSELING:   Advised to take 1 mg of folate supplement per day if capable of pregnancy.   Sexuality: Discussed sexually transmitted diseases, partner selection, use of condoms, avoidance of unintended pregnancy  and contraceptive alternatives.   Advised to avoid cigarette smoking.  I discussed with the patient that most  people either abstain from alcohol or drink within safe limits (<=14/week and <=4 drinks/occasion for males, <=7/weeks and <= 3 drinks/occasion for females) and that the risk for alcohol disorders and other health effects rises proportionally with the number of drinks per week and how often a drinker exceeds daily limits.  Discussed cessation/primary prevention of drug use and availability of treatment for abuse.   Diet: Encouraged to adjust caloric intake to maintain  or achieve ideal body weight, to reduce intake of dietary saturated fat and total fat, to limit sodium intake by avoiding high sodium foods and not adding table salt, and to maintain adequate dietary potassium and calcium preferably from fresh fruits, vegetables, and low-fat dairy products.    Stressed the importance of regular exercise  Injury prevention: Discussed safety belts, safety helmets, smoke detector, smoking near bedding or upholstery.   Dental health: Discussed importance of regular tooth brushing, flossing, and dental visits.    NEXT PREVENTATIVE PHYSICAL DUE IN 1 YEAR. Return in about 1 year (around 06/08/2025) for Annual Physical.

## 2024-06-08 NOTE — Assessment & Plan Note (Signed)
Chronic, stable.  Will continue current medication regimen which is offering benefit.  If ongoing discussed possible need for referral to dermatology and use of monoclonal antibody treatment.

## 2024-06-08 NOTE — Assessment & Plan Note (Signed)
Chronic, stable with no recent migraines.  IUD in place. Monitor closely.

## 2024-06-09 ENCOUNTER — Ambulatory Visit: Payer: Self-pay | Admitting: Nurse Practitioner

## 2024-06-09 LAB — CBC WITH DIFFERENTIAL/PLATELET
Basophils Absolute: 0.1 x10E3/uL (ref 0.0–0.2)
Basos: 2 %
EOS (ABSOLUTE): 0.2 x10E3/uL (ref 0.0–0.4)
Eos: 4 %
Hematocrit: 49.1 % — ABNORMAL HIGH (ref 34.0–46.6)
Hemoglobin: 15.8 g/dL (ref 11.1–15.9)
Immature Grans (Abs): 0 x10E3/uL (ref 0.0–0.1)
Immature Granulocytes: 0 %
Lymphocytes Absolute: 1.3 x10E3/uL (ref 0.7–3.1)
Lymphs: 34 %
MCH: 29.4 pg (ref 26.6–33.0)
MCHC: 32.2 g/dL (ref 31.5–35.7)
MCV: 91 fL (ref 79–97)
Monocytes Absolute: 0.3 x10E3/uL (ref 0.1–0.9)
Monocytes: 9 %
Neutrophils Absolute: 2.1 x10E3/uL (ref 1.4–7.0)
Neutrophils: 51 %
Platelets: 257 x10E3/uL (ref 150–450)
RBC: 5.37 x10E6/uL — ABNORMAL HIGH (ref 3.77–5.28)
RDW: 12.9 % (ref 11.7–15.4)
WBC: 4 x10E3/uL (ref 3.4–10.8)

## 2024-06-09 LAB — LIPID PANEL W/O CHOL/HDL RATIO
Cholesterol, Total: 170 mg/dL (ref 100–199)
HDL: 59 mg/dL (ref >39)
LDL Chol Calc (NIH): 97 mg/dL (ref 0–99)
Triglycerides: 75 mg/dL (ref 0–149)
VLDL Cholesterol Cal: 14 mg/dL (ref 5–40)

## 2024-06-09 LAB — COMPREHENSIVE METABOLIC PANEL WITH GFR
ALT: 25 IU/L (ref 0–32)
AST: 23 IU/L (ref 0–40)
Albumin: 4.5 g/dL (ref 3.8–4.9)
Alkaline Phosphatase: 91 IU/L (ref 44–121)
BUN/Creatinine Ratio: 15 (ref 9–23)
BUN: 10 mg/dL (ref 6–24)
Bilirubin Total: 0.4 mg/dL (ref 0.0–1.2)
CO2: 22 mmol/L (ref 20–29)
Calcium: 9.6 mg/dL (ref 8.7–10.2)
Chloride: 104 mmol/L (ref 96–106)
Creatinine, Ser: 0.66 mg/dL (ref 0.57–1.00)
Globulin, Total: 2.4 g/dL (ref 1.5–4.5)
Glucose: 92 mg/dL (ref 70–99)
Potassium: 4.5 mmol/L (ref 3.5–5.2)
Sodium: 141 mmol/L (ref 134–144)
Total Protein: 6.9 g/dL (ref 6.0–8.5)
eGFR: 105 mL/min/1.73 (ref >59)

## 2024-06-09 LAB — TSH: TSH: 2.57 u[IU]/mL (ref 0.450–4.500)

## 2024-06-09 NOTE — Progress Notes (Signed)
 Contacted via MyChart  Good morning Delaynie, your labs have returned and overall are stable. A little elevation in red blood cell count and hematocrit, but these are mild and we can continue to monitor.  Remainder of CBC looks great.  Any questions? Keep being awesome!!  Thank you for allowing me to participate in your care.  I appreciate you. Kindest regards, Pennye Beeghly

## 2024-06-25 ENCOUNTER — Encounter: Payer: Self-pay | Admitting: Nurse Practitioner

## 2024-10-29 ENCOUNTER — Encounter: Payer: Self-pay | Admitting: Nurse Practitioner

## 2024-10-29 MED ORDER — TRIAMCINOLONE ACETONIDE 0.025 % EX CREA: TOPICAL_CREAM | Freq: Two times a day (BID) | CUTANEOUS | 3 refills | Status: AC

## 2024-11-16 ENCOUNTER — Ambulatory Visit: Attending: Cardiology | Admitting: Cardiology

## 2024-11-16 ENCOUNTER — Encounter: Payer: Self-pay | Admitting: Cardiology

## 2024-11-16 ENCOUNTER — Ambulatory Visit

## 2024-11-16 VITALS — BP 114/78 | HR 73 | Ht 69.0 in | Wt 200.6 lb

## 2024-11-16 DIAGNOSIS — R600 Localized edema: Secondary | ICD-10-CM

## 2024-11-16 DIAGNOSIS — I493 Ventricular premature depolarization: Secondary | ICD-10-CM | POA: Diagnosis not present

## 2024-11-16 NOTE — Progress Notes (Signed)
 " Cardiology Office Note:    Date:  11/16/2024   ID:  Elizabeth Case, DOB 1971-10-31, MRN [id number]  PCP:  Valerio Melanie DASEN, NP  [facility] Cardiologist:  Redell Cave, MD  [facility] Electrophysiologist:  None   Referring MD: Valerio Melanie DASEN, NP   Chief Complaint  Patient presents with   Follow-up    18 month follow up visit. Patient states she wakes up with her hands and feet are swollen in the morning. Meds reviewed.     History of Present Illness:    Elizabeth Case is a 54 y.o. female with a hx of GERD, frequent PVCs who presents for follow-up.   Being seen due to frequent PVCs, previous echo showed normal systolic function.  Endorses swelling in her hands and ankles usually in the mornings that improves as the day progresses.  States feeling symptoms of skipped heartbeats/PVCs more often recently.  Denies chest pain, denies shortness of breath.   Prior notes Echo 08/2022 EF 55 to 60% Echocardiogram 07/2020 normal systolic and diastolic function, EF 55 to 60%. Cardiac monitor: 06/2020 frequent PVCs, 15% PVC burden. Did not tolerate Toprol -XL in the past.   Past Medical History:  Diagnosis Date   Allergic rhinitis    Allergy    Kiwi, latex, walnuts   Eczema    GERD (gastroesophageal reflux disease)    Heart murmur July   PVC  Not really heart murmur   Migraines    1x/mo   PONV (postoperative nausea and vomiting)    Vitamin D  deficiency disease     Past Surgical History:  Procedure Laterality Date   BOTOX  INJECTION N/A 08/06/2022   Procedure: BOTOX  INJECTION;  Surgeon: Lane Shope, MD;  Location: [facility];  Service: General;  Laterality: N/A;   BREAST BIOPSY     BREAST EXCISIONAL BIOPSY Right    BREAST REDUCTION SURGERY  1994   BREAST SURGERY Right 2012   lumpectomy   CHOLECYSTECTOMY  1998   COLONOSCOPY WITH PROPOFOL  N/A 09/08/2016   Procedure: COLONOSCOPY WITH PROPOFOL ;  Surgeon: Ruel Kung, MD;  Location: [facility];   Service: Endoscopy;  Laterality: N/A;  Latex sensitivity   RECTAL EXAM UNDER ANESTHESIA N/A 08/06/2022   Procedure: RECTAL EXAM UNDER ANESTHESIA;  Surgeon: Lane Shope, MD;  Location: [facility];  Service: General;  Laterality: N/A;   REDUCTION MAMMAPLASTY Bilateral 1994   SPHINCTEROTOMY N/A 08/06/2022   Procedure: ANNETT, chemical;  Surgeon: Lane Shope, MD;  Location: [facility];  Service: General;  Laterality: N/A;   WISDOM TOOTH EXTRACTION      Current Medications: Current Meds  Medication Sig   cyclobenzaprine  (FLEXERIL ) 10 MG tablet Take 1 tablet (10 mg total) by mouth at bedtime.   ibuprofen (ADVIL) 200 MG tablet Take 200 mg by mouth every 6 (six) hours as needed. 4 tablets   levonorgestrel  (MIRENA ) 20 MCG/DAY IUD 1 each by Intrauterine route once.   nitrofurantoin , macrocrystal-monohydrate, (MACROBID ) 100 MG capsule Take one tablet by mouth after intercourse for prevention.   ondansetron  (ZOFRAN -ODT) 4 MG disintegrating tablet Take 1 tablet (4 mg total) by mouth every 8 (eight) hours as needed for nausea or vomiting.   triamcinolone  (KENALOG ) 0.025 % cream Apply topically 2 (two) times daily.     Allergies:   Kiwi extract, Latex, and Other   Social History   Socioeconomic History   Marital status: Married    Spouse name: Ludie   Number of children: 2   Years of education: 85  Highest education level: Bachelor's degree (e.g., BA, AB, BS)  Occupational History   Not on file  Tobacco Use   Smoking status: Never    Passive exposure: Never   Smokeless tobacco: Never  Vaping Use   Vaping status: Never Used  Substance and Sexual Activity   Alcohol use: Yes    Alcohol/week: 0.0 standard drinks of alcohol    Comment: holidays   Drug use: No   Sexual activity: Yes    Birth control/protection: I.U.D.    Comment: Mirena   Other Topics Concern   Not on file  Social History Narrative   Not on file   Social Drivers of Health   Tobacco Use: Low Risk  (11/16/2024)   Patient History    Smoking Tobacco Use: Never    Smokeless Tobacco Use: Never    Passive Exposure: Never  Financial Resource Strain: Low Risk (06/08/2024)   Overall Financial Resource Strain (CARDIA)    Difficulty of Paying Living Expenses: Not hard at all  Food Insecurity: No Food Insecurity (06/08/2024)   Epic    Worried About Radiation Protection Practitioner of Food in the Last Year: Never true    Ran Out of Food in the Last Year: Never true  Transportation Needs: No Transportation Needs (06/08/2024)   Epic    Lack of Transportation (Medical): No    Lack of Transportation (Non-Medical): No  Physical Activity: Sufficiently Active (06/08/2024)   Exercise Vital Sign    Days of Exercise per Week: 5 days    Minutes of Exercise per Session: 50 min  Stress: No Stress Concern Present (06/08/2024)   [employer] of Occupational Health - Occupational Stress Questionnaire    Feeling of Stress: Not at all  Social Connections: Socially Integrated (06/08/2024)   Social Connection and Isolation Panel    Frequency of Communication with Friends and Family: More than three times a week    Frequency of Social Gatherings with Friends and Family: Twice a week    Attends Religious Services: More than 4 times per year    Active Member of Clubs or Organizations: Yes    Attends Banker Meetings: More than 4 times per year    Marital Status: Married  Depression (PHQ2-9): Medium Risk (06/08/2024)   Depression (PHQ2-9)    PHQ-2 Score: 6  Alcohol Screen: Low Risk (06/08/2024)   Alcohol Screen    Last Alcohol Screening Score (AUDIT): 0  Housing: Low Risk (06/08/2024)   Epic    Unable to Pay for Housing in the Last Year: No    Number of Times Moved in the Last Year: 0    Homeless in the Last Year: No  Utilities: Not At Risk (06/08/2024)   Epic    Threatened with loss of utilities: No  Health Literacy: Adequate Health Literacy (06/08/2024)   B1300 Health Literacy    Frequency of need for help with medical  instructions: Never     Family History: The patient's family history includes Aneurysm in her paternal grandfather; Breast cancer in her paternal grandmother; Cancer in her paternal grandmother; Cancer (age of onset: 9) in her father; Hypertension in her father and mother; Pulmonary fibrosis in her mother; Stroke in her maternal grandmother and paternal grandfather. There is no history of Diabetes or Heart disease.  ROS:   Please see the history of present illness.     All other systems reviewed and are negative.  EKGs/Labs/Other Studies Reviewed:    The following studies were reviewed today:   EKG  Interpretation Date/Time:  Friday November 16 2024 11:58:37 EST Ventricular Rate:  73 PR Interval:  116 QRS Duration:  78 QT Interval:  392 QTC Calculation: 431 R Axis:   45  Text Interpretation: Normal sinus rhythm with occasional Premature ventricular complexes Confirmed by Darliss Rogue ([id number]) on 11/16/2024 12:09:52 PM    Recent Labs: 06/08/2024: ALT 25; BUN 10; Creatinine, Ser 0.66; Hemoglobin 15.8; Platelets 257; Potassium 4.5; Sodium 141; TSH 2.570  Recent Lipid Panel    Component Value Date/Time   CHOL 170 06/08/2024 0846   TRIG 75 06/08/2024 0846   HDL 59 06/08/2024 0846   CHOLHDL 3.4 05/08/2020 1533   VLDL 20 02/08/2017 1418   LDLCALC 97 06/08/2024 0846   LDLCALC 86 05/08/2020 1533    Physical Exam:    VS:  BP 114/78 (BP Location: Left Arm, Patient Position: Sitting, Cuff Size: Large)   Pulse 73   Ht 5' 9 (1.753 m)   Wt 200 lb 9.6 oz (91 kg)   SpO2 98%   BMI 29.62 kg/m     Wt Readings from Last 3 Encounters:  11/16/24 200 lb 9.6 oz (91 kg)  06/08/24 199 lb 12.8 oz (90.6 kg)  02/16/24 188 lb (85.3 kg)     GEN:  Well nourished, well developed in no acute distress HEENT: Normal NECK: No JVD; No carotid bruits CARDIAC: RRR, occasional skipped beats, no murmurs, rubs, gallops RESPIRATORY:  Clear to auscultation without rales, wheezing or rhonchi   ABDOMEN: Soft, non-tender, non-distended MUSCULOSKELETAL:  No edema; No deformity  SKIN: Warm and dry NEUROLOGIC:  Alert and oriented x 3 PSYCHIATRIC:  Normal affect   ASSESSMENT:    1. Frequent unifocal PVCs   2. Leg edema    PLAN:    In order of problems listed above:  History of frequent PVCs, LBBB pattern, 15% burden on cardiac monitor for 8/21.  Endorses being more symptomatic.  Repeat cardiac monitor to evaluate burden.  Repeat echo.  Did not tolerate beta-blocker/Toprol -XL in the past.  Leg edema, improved as the day progresses.  No edema noted on exam.  Appears noncardiac.  Low-salt diet advised, echo as above.   Follow-up in 6 to 8 weeks  Medication Adjustments/Labs and Tests Ordered: Current medicines are reviewed at length with the patient today.  Concerns regarding medicines are outlined above.  Orders Placed This Encounter  Procedures   LONG TERM MONITOR (3-14 DAYS)   EKG 12-Lead   ECHOCARDIOGRAM COMPLETE   No orders of the defined types were placed in this encounter.   Patient Instructions  Medication Instructions:  Your physician recommends that you continue on your current medications as directed. Please refer to the Current Medication list given to you today.   *If you need a refill on your cardiac medications before your next appointment, please call your pharmacy*  Lab Work: No labs ordered today  If you have labs (blood work) drawn today and your tests are completely normal, you will receive your results only by: MyChart Message (if you have MyChart) OR A paper copy in the mail If you have any lab test that is abnormal or we need to change your treatment, we will call you to review the results.  Testing/Procedures: Your physician has requested that you have an echocardiogram. Echocardiography is a painless test that uses sound waves to create images of your heart. It provides your doctor with information about the size and shape of your heart and how  well your hearts chambers and valves  are working.   You may receive an ultrasound enhancing agent through an IV if needed to better visualize your heart during the echo. This procedure takes approximately one hour.  There are no restrictions for this procedure.  This will take place at [address] [facility] [address], [place] [address]  Please note: We ask at that you not bring children with you during ultrasound (echo/ vascular) testing. Due to room size and safety concerns, children are not allowed in the ultrasound rooms during exams. Our front office staff cannot provide observation of children in our lobby area while testing is being conducted. An adult accompanying a patient to their appointment will only be allowed in the ultrasound room at the discretion of the ultrasound technician under special circumstances. We apologize for any inconvenience. ZIO XT- Long Term Monitor Instructions  Your physician has requested you wear a ZIO patch monitor for 7 days.  This is a single patch monitor. [employer] supplies one patch monitor per enrollment. Additional stickers are not available. Please do not apply patch if you will be having a Nuclear Stress Test,  Echocardiogram, Cardiac CT, MRI, or Chest Xray during the period you would be wearing the  monitor. The patch cannot be worn during these tests. You cannot remove and re-apply the  ZIO XT patch monitor.  Your ZIO patch monitor will be mailed 3 day USPS to your address on file. It may take 3-5 days  to receive your monitor after you have been enrolled.  Once you have received your monitor, please review the enclosed instructions. Your monitor  has already been registered assigning a specific monitor serial # to you.  Billing and Patient Assistance Program Information  We have supplied [employer] with any of your insurance information on file for billing purposes. [employer] offers a sliding scale Patient Assistance Program for  patients that do not have  insurance, or whose insurance does not completely cover the cost of the ZIO monitor.  You must apply for the Patient Assistance Program to qualify for this discounted rate.  To apply, please call [employer] at [phone], select option 4, select option 2, ask to apply for  Patient Assistance Program. Meredeth will ask your household income, and how many people  are in your household. They will quote your out-of-pocket cost based on that information.  [employer] will also be able to set up a 83-month, interest-free payment plan if needed.  Applying the monitor   Shave hair from upper left chest.  Hold abrader disc by orange tab. Rub abrader in 40 strokes over the upper left chest as  indicated in your monitor instructions.  Clean area with 4 enclosed alcohol pads. Let dry.  Apply patch as indicated in monitor instructions. Patch will be placed under collarbone on left  side of chest with arrow pointing upward.  Rub patch adhesive wings for 2 minutes. Remove white label marked 1. Remove the white  label marked 2. Rub patch adhesive wings for 2 additional minutes.  While looking in a mirror, press and release button in center of patch. A small green light will  flash 3-4 times. This will be your only indicator that the monitor has been turned on.  Do not shower for the first 24 hours. You may shower after the first 24 hours.  Press the button if you feel a symptom. You will hear a small click. Record Date, Time and  Symptom in the Patient Logbook.  When you are ready to remove  the patch, follow instructions on the last 2 pages of Patient  Logbook. Stick patch monitor onto the last page of Patient Logbook.  Place Patient Logbook in the blue and white box. Use locking tab on box and tape box closed  securely. The blue and white box has prepaid postage on it. Please place it in the mailbox as  soon as possible. Your physician should have your test results approximately  7 days after the  monitor has been mailed back to [facility]  Call [facility] Customer Care at [phone] if you have questions regarding  your ZIO XT patch monitor. Call them immediately if you see an orange light blinking on your  monitor.  If your monitor falls off in less than 4 days, contact our Monitor department at [phone].  If your monitor becomes loose or falls off after 4 days call [employer] at [phone] for  suggestions on securing your monitor   Follow-Up: At [facility], you and your health needs are our priority.  As part of our continuing mission to provide you with exceptional heart care, our providers are all part of one team.  This team includes your primary Cardiologist (physician) and Advanced Practice Providers or APPs (Physician Assistants and Nurse Practitioners) who all work together to provide you with the care you need, when you need it.  Your next appointment:   2 month(s)  Provider:   You may see Redell Cave, MD or one of the following Advanced Practice Providers on your designated Care Team:   Lonni Meager, NP Lesley Maffucci, PA-C Bernardino Bring, PA-C Cadence [place], PA-C Tylene Lunch, NP Barnie Hila, NP    We recommend signing up for the patient portal called MyChart.  Sign up information is provided on this After Visit Summary.  MyChart is used to connect with patients for Virtual Visits (Telemedicine).  Patients are able to view lab/test results, encounter notes, upcoming appointments, etc.  Non-urgent messages can be sent to your provider as well.   To learn more about what you can do with MyChart, go to [email or web address].               Signed, Redell Cave, MD  11/16/2024 12:30 PM    [facility] "

## 2024-11-16 NOTE — Patient Instructions (Signed)
 Medication Instructions:  Your physician recommends that you continue on your current medications as directed. Please refer to the Current Medication list given to you today.   *If you need a refill on your cardiac medications before your next appointment, please call your pharmacy*  Lab Work: No labs ordered today  If you have labs (blood work) drawn today and your tests are completely normal, you will receive your results only by: MyChart Message (if you have MyChart) OR A paper copy in the mail If you have any lab test that is abnormal or we need to change your treatment, we will call you to review the results.  Testing/Procedures: Your physician has requested that you have an echocardiogram. Echocardiography is a painless test that uses sound waves to create images of your heart. It provides your doctor with information about the size and shape of your heart and how well your hearts chambers and valves are working.   You may receive an ultrasound enhancing agent through an IV if needed to better visualize your heart during the echo. This procedure takes approximately one hour.  There are no restrictions for this procedure.  This will take place at [address] [facility] [address]) [address], [place] [address]  Please note: We ask at that you not bring children with you during ultrasound (echo/ vascular) testing. Due to room size and safety concerns, children are not allowed in the ultrasound rooms during exams. Our front office staff cannot provide observation of children in our lobby area while testing is being conducted. An adult accompanying a patient to their appointment will only be allowed in the ultrasound room at the discretion of the ultrasound technician under special circumstances. We apologize for any inconvenience. ZIO XT- Long Term Monitor Instructions  Your physician has requested you wear a ZIO patch monitor for 7 days.  This is a single patch monitor. [employer] supplies  one patch monitor per enrollment. Additional stickers are not available. Please do not apply patch if you will be having a Nuclear Stress Test,  Echocardiogram, Cardiac CT, MRI, or Chest Xray during the period you would be wearing the  monitor. The patch cannot be worn during these tests. You cannot remove and re-apply the  ZIO XT patch monitor.  Your ZIO patch monitor will be mailed 3 day USPS to your address on file. It may take 3-5 days  to receive your monitor after you have been enrolled.  Once you have received your monitor, please review the enclosed instructions. Your monitor  has already been registered assigning a specific monitor serial # to you.  Billing and Patient Assistance Program Information  We have supplied [employer] with any of your insurance information on file for billing purposes. [employer] offers a sliding scale Patient Assistance Program for patients that do not have  insurance, or whose insurance does not completely cover the cost of the ZIO monitor.  You must apply for the Patient Assistance Program to qualify for this discounted rate.  To apply, please call [employer] at [phone], select option 4, select option 2, ask to apply for  Patient Assistance Program. Meredeth will ask your household income, and how many people  are in your household. They will quote your out-of-pocket cost based on that information.  [employer] will also be able to set up a 31-month, interest-free payment plan if needed.  Applying the monitor   Shave hair from upper left chest.  Hold abrader disc by orange tab. Rub abrader in 40 strokes  over the upper left chest as  indicated in your monitor instructions.  Clean area with 4 enclosed alcohol pads. Let dry.  Apply patch as indicated in monitor instructions. Patch will be placed under collarbone on left  side of chest with arrow pointing upward.  Rub patch adhesive wings for 2 minutes. Remove white label marked 1. Remove the white  label  marked 2. Rub patch adhesive wings for 2 additional minutes.  While looking in a mirror, press and release button in center of patch. A small green light will  flash 3-4 times. This will be your only indicator that the monitor has been turned on.  Do not shower for the first 24 hours. You may shower after the first 24 hours.  Press the button if you feel a symptom. You will hear a small click. Record Date, Time and  Symptom in the Patient Logbook.  When you are ready to remove the patch, follow instructions on the last 2 pages of Patient  Logbook. Stick patch monitor onto the last page of Patient Logbook.  Place Patient Logbook in the blue and white box. Use locking tab on box and tape box closed  securely. The blue and white box has prepaid postage on it. Please place it in the mailbox as  soon as possible. Your physician should have your test results approximately 7 days after the  monitor has been mailed back to [facility]  Call [facility] Customer Care at [phone] if you have questions regarding  your ZIO XT patch monitor. Call them immediately if you see an orange light blinking on your  monitor.  If your monitor falls off in less than 4 days, contact our Monitor department at [phone].  If your monitor becomes loose or falls off after 4 days call [employer] at [phone] for  suggestions on securing your monitor   Follow-Up: At [facility], you and your health needs are our priority.  As part of our continuing mission to provide you with exceptional heart care, our providers are all part of one team.  This team includes your primary Cardiologist (physician) and Advanced Practice Providers or APPs (Physician Assistants and Nurse Practitioners) who all work together to provide you with the care you need, when you need it.  Your next appointment:   2 month(s)  Provider:   You may see Redell Cave, MD or one of the following Advanced Practice Providers  on your designated Care Team:   Lonni Meager, NP Lesley Maffucci, PA-C Bernardino Bring, PA-C Cadence [place], PA-C Tylene Lunch, NP Barnie Hila, NP    We recommend signing up for the patient portal called MyChart.  Sign up information is provided on this After Visit Summary.  MyChart is used to connect with patients for Virtual Visits (Telemedicine).  Patients are able to view lab/test results, encounter notes, upcoming appointments, etc.  Non-urgent messages can be sent to your provider as well.   To learn more about what you can do with MyChart, go to [email or web address].

## 2024-12-06 ENCOUNTER — Ambulatory Visit

## 2024-12-06 DIAGNOSIS — I493 Ventricular premature depolarization: Secondary | ICD-10-CM

## 2024-12-06 LAB — ECHOCARDIOGRAM COMPLETE
AR max vel: 2.46 cm2
AV Area VTI: 2.16 cm2
AV Area mean vel: 2.23 cm2
AV Mean grad: 4 mmHg
AV Peak grad: 6.7 mmHg
Ao pk vel: 1.29 m/s
Area-P 1/2: 3.99 cm2
S' Lateral: 2.6 cm

## 2024-12-07 ENCOUNTER — Ambulatory Visit: Payer: Self-pay | Admitting: Cardiology

## 2025-01-16 ENCOUNTER — Ambulatory Visit: Admitting: Cardiology

## 2025-06-12 ENCOUNTER — Encounter: Admitting: Nurse Practitioner
